# Patient Record
Sex: Female | Born: 1945 | Race: White | Hispanic: No | Smoking: Never smoker
Health system: Southern US, Community
[De-identification: ages and names within clinical notes are randomized; demographics above are authoritative.]

## PROBLEM LIST (undated history)

## (undated) DIAGNOSIS — R112 Nausea with vomiting, unspecified: Secondary | ICD-10-CM

## (undated) DIAGNOSIS — N870 Mild cervical dysplasia: Secondary | ICD-10-CM

## (undated) DIAGNOSIS — Z9889 Other specified postprocedural states: Secondary | ICD-10-CM

## (undated) DIAGNOSIS — R0683 Snoring: Secondary | ICD-10-CM

## (undated) DIAGNOSIS — N952 Postmenopausal atrophic vaginitis: Secondary | ICD-10-CM

## (undated) DIAGNOSIS — R011 Cardiac murmur, unspecified: Secondary | ICD-10-CM

## (undated) DIAGNOSIS — R5383 Other fatigue: Secondary | ICD-10-CM

## (undated) DIAGNOSIS — E7800 Pure hypercholesterolemia, unspecified: Secondary | ICD-10-CM

## (undated) DIAGNOSIS — I10 Essential (primary) hypertension: Secondary | ICD-10-CM

## (undated) DIAGNOSIS — Z87442 Personal history of urinary calculi: Secondary | ICD-10-CM

## (undated) DIAGNOSIS — G4733 Obstructive sleep apnea (adult) (pediatric): Secondary | ICD-10-CM

## (undated) DIAGNOSIS — M1990 Unspecified osteoarthritis, unspecified site: Secondary | ICD-10-CM

## (undated) DIAGNOSIS — R5381 Other malaise: Secondary | ICD-10-CM

## (undated) DIAGNOSIS — J343 Hypertrophy of nasal turbinates: Secondary | ICD-10-CM

## (undated) DIAGNOSIS — Z87898 Personal history of other specified conditions: Secondary | ICD-10-CM

## (undated) HISTORY — DX: Other specified postprocedural states: Z98.890

## (undated) HISTORY — DX: Pure hypercholesterolemia, unspecified: E78.00

## (undated) HISTORY — PX: CYSTOSCOPY: SUR368

## (undated) HISTORY — DX: Other malaise: R53.81

## (undated) HISTORY — DX: Obstructive sleep apnea (adult) (pediatric): G47.33

## (undated) HISTORY — DX: Snoring: R06.83

## (undated) HISTORY — PX: OTHER SURGICAL HISTORY: SHX169

## (undated) HISTORY — DX: Unspecified osteoarthritis, unspecified site: M19.90

## (undated) HISTORY — DX: Essential (primary) hypertension: I10

## (undated) HISTORY — DX: Mild cervical dysplasia: N87.0

## (undated) HISTORY — DX: Other specified postprocedural states: R11.2

## (undated) HISTORY — DX: Other fatigue: R53.83

## (undated) HISTORY — DX: Postmenopausal atrophic vaginitis: N95.2

---

## 1997-11-19 ENCOUNTER — Other Ambulatory Visit: Admission: RE | Admit: 1997-11-19 | Discharge: 1997-11-19 | Payer: Self-pay | Admitting: Obstetrics and Gynecology

## 1998-12-23 ENCOUNTER — Other Ambulatory Visit: Admission: RE | Admit: 1998-12-23 | Discharge: 1998-12-23 | Payer: Self-pay | Admitting: Obstetrics and Gynecology

## 1999-12-23 ENCOUNTER — Other Ambulatory Visit: Admission: RE | Admit: 1999-12-23 | Discharge: 1999-12-23 | Payer: Self-pay | Admitting: Obstetrics and Gynecology

## 2000-07-16 ENCOUNTER — Encounter: Payer: Self-pay | Admitting: Specialist

## 2000-07-23 ENCOUNTER — Ambulatory Visit (HOSPITAL_COMMUNITY): Admission: RE | Admit: 2000-07-23 | Discharge: 2000-07-23 | Payer: Self-pay | Admitting: Specialist

## 2000-12-22 ENCOUNTER — Other Ambulatory Visit: Admission: RE | Admit: 2000-12-22 | Discharge: 2000-12-22 | Payer: Self-pay | Admitting: Obstetrics and Gynecology

## 2001-12-22 ENCOUNTER — Other Ambulatory Visit: Admission: RE | Admit: 2001-12-22 | Discharge: 2001-12-22 | Payer: Self-pay | Admitting: Obstetrics and Gynecology

## 2003-01-08 ENCOUNTER — Other Ambulatory Visit: Admission: RE | Admit: 2003-01-08 | Discharge: 2003-01-08 | Payer: Self-pay | Admitting: Obstetrics and Gynecology

## 2003-06-09 HISTORY — PX: CERVICAL BIOPSY  W/ LOOP ELECTRODE EXCISION: SUR135

## 2004-01-09 ENCOUNTER — Other Ambulatory Visit: Admission: RE | Admit: 2004-01-09 | Discharge: 2004-01-09 | Payer: Self-pay | Admitting: Obstetrics and Gynecology

## 2004-03-08 DIAGNOSIS — N870 Mild cervical dysplasia: Secondary | ICD-10-CM

## 2004-03-08 HISTORY — DX: Mild cervical dysplasia: N87.0

## 2004-07-01 ENCOUNTER — Other Ambulatory Visit: Admission: RE | Admit: 2004-07-01 | Discharge: 2004-07-01 | Payer: Self-pay | Admitting: Obstetrics and Gynecology

## 2005-01-13 ENCOUNTER — Other Ambulatory Visit: Admission: RE | Admit: 2005-01-13 | Discharge: 2005-01-13 | Payer: Self-pay | Admitting: Obstetrics and Gynecology

## 2005-07-21 ENCOUNTER — Other Ambulatory Visit: Admission: RE | Admit: 2005-07-21 | Discharge: 2005-07-21 | Payer: Self-pay | Admitting: Obstetrics and Gynecology

## 2006-01-14 ENCOUNTER — Other Ambulatory Visit: Admission: RE | Admit: 2006-01-14 | Discharge: 2006-01-14 | Payer: Self-pay | Admitting: Obstetrics and Gynecology

## 2006-07-23 ENCOUNTER — Other Ambulatory Visit: Admission: RE | Admit: 2006-07-23 | Discharge: 2006-07-23 | Payer: Self-pay | Admitting: Obstetrics and Gynecology

## 2007-01-17 ENCOUNTER — Other Ambulatory Visit: Admission: RE | Admit: 2007-01-17 | Discharge: 2007-01-17 | Payer: Self-pay | Admitting: Obstetrics and Gynecology

## 2007-02-24 ENCOUNTER — Other Ambulatory Visit: Admission: RE | Admit: 2007-02-24 | Discharge: 2007-02-24 | Payer: Self-pay | Admitting: Obstetrics and Gynecology

## 2007-06-09 HISTORY — PX: REPLACEMENT TOTAL KNEE: SUR1224

## 2007-07-27 ENCOUNTER — Inpatient Hospital Stay (HOSPITAL_COMMUNITY): Admission: RE | Admit: 2007-07-27 | Discharge: 2007-07-30 | Payer: Self-pay | Admitting: Orthopedic Surgery

## 2008-01-18 ENCOUNTER — Other Ambulatory Visit: Admission: RE | Admit: 2008-01-18 | Discharge: 2008-01-18 | Payer: Self-pay | Admitting: Obstetrics and Gynecology

## 2009-01-18 ENCOUNTER — Other Ambulatory Visit: Admission: RE | Admit: 2009-01-18 | Discharge: 2009-01-18 | Payer: Self-pay | Admitting: Obstetrics and Gynecology

## 2009-01-18 ENCOUNTER — Ambulatory Visit: Payer: Self-pay | Admitting: Obstetrics and Gynecology

## 2009-01-18 ENCOUNTER — Encounter: Payer: Self-pay | Admitting: Obstetrics and Gynecology

## 2009-02-19 ENCOUNTER — Encounter: Payer: Self-pay | Admitting: Obstetrics and Gynecology

## 2009-02-19 ENCOUNTER — Other Ambulatory Visit: Admission: RE | Admit: 2009-02-19 | Discharge: 2009-02-19 | Payer: Self-pay | Admitting: Obstetrics and Gynecology

## 2009-02-19 ENCOUNTER — Ambulatory Visit: Payer: Self-pay | Admitting: Obstetrics and Gynecology

## 2009-06-20 ENCOUNTER — Ambulatory Visit: Payer: Self-pay | Admitting: Obstetrics and Gynecology

## 2009-06-20 ENCOUNTER — Other Ambulatory Visit: Admission: RE | Admit: 2009-06-20 | Discharge: 2009-06-20 | Payer: Self-pay | Admitting: Obstetrics and Gynecology

## 2010-02-12 ENCOUNTER — Ambulatory Visit: Payer: Self-pay | Admitting: Obstetrics and Gynecology

## 2010-02-12 ENCOUNTER — Other Ambulatory Visit: Admission: RE | Admit: 2010-02-12 | Discharge: 2010-02-12 | Payer: Self-pay | Admitting: Obstetrics and Gynecology

## 2010-08-13 ENCOUNTER — Other Ambulatory Visit: Payer: Self-pay | Admitting: Obstetrics and Gynecology

## 2010-08-13 ENCOUNTER — Ambulatory Visit (INDEPENDENT_AMBULATORY_CARE_PROVIDER_SITE_OTHER): Payer: Medicare Other | Admitting: Obstetrics and Gynecology

## 2010-08-13 ENCOUNTER — Other Ambulatory Visit (HOSPITAL_COMMUNITY)
Admission: RE | Admit: 2010-08-13 | Discharge: 2010-08-13 | Disposition: A | Discharge status: Home / Self Care | Payer: Medicare Other | Source: Ambulatory Visit | Attending: Obstetrics and Gynecology | Admitting: Obstetrics and Gynecology

## 2010-08-13 DIAGNOSIS — N870 Mild cervical dysplasia: Secondary | ICD-10-CM

## 2010-08-13 DIAGNOSIS — Z9189 Other specified personal risk factors, not elsewhere classified: Secondary | ICD-10-CM | POA: Insufficient documentation

## 2010-10-21 NOTE — H&P (Signed)
NAMEDONYAE, KOHN NO.:  [id number]   MEDICAL RECORD NO.:  [id number]          PATIENT TYPE:  INP   LOCATION:  1611                         FACILITY:  [facility]   PHYSICIAN:  Ollen Gross, M.D.    DATE OF BIRTH:  May 30, 1946   DATE OF ADMISSION:  07/27/2007  DATE OF DISCHARGE:                              HISTORY & PHYSICAL   DATE OF OFFICE VISIT:  History and physical was performed on July 21, 2007.   CHIEF COMPLAINT/HISTORY OF PRESENT ILLNESS:  This is a 65 year old  female who has been seen by Dr. Lequita Halt for ongoing right knee pain.  She has known end-stage arthritis.  The pain has been progressive in  nature and unresponsive to conservative measures.  It has reached a  point where she would benefit undergoing surgical intervention.  The  risks and benefits have been discussed.  She elected to proceed with  surgery.   ALLERGIES:  No known drug allergies.   CURRENT MEDICATIONS:  1. Pravastatin 80 mg daily.  2. Atacand 32/12.5 daily.  3. Metoprolol 50 mg twice a day.  4. Norvasc 5 mg daily.  5. Premarin vaginal cream 0.5 g application 3x a week.  6. Advil p.r.n.  7. Aleve p.r.n.  8. Triamcinolone for eczema.  9. Pepcid AC.   PAST MEDICAL HISTORY:  1. Hypertension.  2. Hypercholesterolemia.  3. Cardiac murmur.  4. Reflux disease.  5. History of gastric ulcers many years ago.  6. History of renal calculi.  7. Eczema.  8. Childhood illnesses to include measles and mumps.   PAST SURGICAL HISTORY:  Torn meniscus   FAMILY HISTORY:  Father deceased at 41 with heart disease.  Mother  deceased at age 28 with diabetes and heart disease.  She has one  brother, two sisters, and two sons.   SOCIAL HISTORY:  Widowed, nonsmoker, no alcohol, two children, lives  alone.   REVIEW OF SYSTEMS:  GENERAL:  No fevers, chills, or night sweats.  NEUROLOGIC:  No seizures, syncope, or paralysis.  RESPIRATORY:  No  shortness breath, cough, or hemoptysis.   CARDIOVASCULAR:  No chest pain  or orthopnea.  GI: No nausea, vomiting, diarrhea, or constipation.  GU:  A little bit of a nocturia.  No dysuria or hematuria.  MUSCULOSKELETAL:  Joint pain and stiffness.   PHYSICAL EXAMINATION:  VITAL SIGNS:  Pulse 64, respiratory rate 12,  blood pressure 181/76.  GENERAL:  A 65 year old white female, short stature, mildly anxious,  alert, oriented, cooperative, and pleasant.  Excellent historian.  HEENT:  Normocephalic, atraumatic.  Pupils are round and reactive.  EOMs  intact.  NECK:  Supple.  CHEST:  Clear anterior posterior chest walls.  No rhonchi, rales or  wheezing.  HEART:  Regular rate and rhythm.  Grade 2-3/6 systolic ejection murmur  best heard over aortic and pulmonic point.  ABDOMEN:  Soft, round.  Bowel sounds present.  RECTAL BREASTS, AND GENITALIA:  Not done, not pertinent to history of  present illness.  EXTREMITIES:  Right knee range of motion 5-120.  No effusion.  Marked  crepitus noted.   IMPRESSION:  1. Osteoarthritis right knee.  2. Hypertension.  3. Hypercholesterolemia.  4. Cardiac murmur.  5. Gastroesophageal reflux disease.  6. History of gastric ulcers.  7. Renal calculi.  8. Eczema.  9. Postmenopausal.  10.Childhood illnesses of measles and mumps.   PLAN:  The patient was admitted to [facility] to undergo right  total knee replacement arthroplasty.  Surgery will be followed by Dr.  Ollen Gross.      Alexzandrew L. Perkins, P.A.C.      Ollen Gross, M.D.  Electronically Signed    ALP/MEDQ  D:  07/27/2007  T:  07/27/2007  Job:  [id number]   cc:   Ollen Gross, M.D.  Fax: [phone]   Barry Dienes. Eloise Harman, M.D.  Fax: [phone]   Rande Brunt. Eda Paschal, M.D.  Fax: [phone]   Griffith Citron, M.D.  Fax: [phone]

## 2010-10-21 NOTE — Op Note (Signed)
Jessica Gibson, CRISANTI NO.:  [id number]   MEDICAL RECORD NO.:  [id number]          PATIENT TYPE:  INP   LOCATION:  1611                         FACILITY:  [facility]   PHYSICIAN:  Ollen Gross, M.D.    DATE OF BIRTH:  Mar 01, 1946   DATE OF PROCEDURE:  07/27/2007  DATE OF DISCHARGE:                               OPERATIVE REPORT   PREOPERATIVE DIAGNOSIS:  Osteoarthritis right knee.   POSTOPERATIVE DIAGNOSIS:  Osteoarthritis right knee.   PROCEDURE:  Right total knee arthroplasty.   SURGEON:  Ollen Gross, M.D.   ASSISTANT:  Avel Peace PA-C.   ANESTHESIA:  Spinal with Duramorph.   ESTIMATED BLOOD LOSS:  Minimal.   DRAIN:  None.   TOURNIQUET TIME:  43 minutes at 300 mmHg.   COMPLICATIONS:  None.   CONDITION:  Stable to recovery.   BRIEF CLINICAL NOTE:  Jessica Gibson  is 65 year old female who has end-stage  arthritis of the right knee with progressively worsening pain and  dysfunction.  She has failed nonoperative management including  injections and presents for total knee arthroplasty.   PROCEDURE IN DETAIL:  After the successful administration of spinal  anesthetic, a tourniquet is placed high on the right thigh and the right  lower extremity prepped and draped in the usual sterile fashion.  The  extremity is wrapped in Esmarch, knee flexed and tourniquet inflated 300  mmHg.  Midline incision is made with a 10 blade through subcutaneous  tissue to the level of the extensor mechanism.  A fresh blade is used to  make a medial parapatellar arthrotomy.  Soft tissue over the proximal  medial tibia is subperiosteally elevated to the joint line with the  knife and into the semimembranosus bursa with a Cobb elevator.  Soft  tissue laterally is elevated with attention being paid to avoid the  patellar tendon on tibial tubercle.  Patella subluxed laterally, knee  flexed 90 degrees, ACL and PCL removed.  Drill was used create a  starting hole in the distal femur and  canal was thoroughly irrigated.  The 5 degree right valgus alignment guide is placed and referencing off  the posterior condyles rotations marked ad a block pinned to remove 11  mm off the distal femur.  I took 11 because of preop flexion  contracture.  Distal femoral resection is made with an oscillating saw.  Sizing blocks placed and size 2.5 is most appropriate.  Rotations marked  off the epicondylar axis.  Size 2.5 cutting blocks placed and the  anterior, posterior and chamfer cuts made.   Tibia is subluxed forward and the menisci are removed.  Extramedullary  tibial alignment guide is placed referencing proximally at the medial  aspect of the tibial tubercle and distally along the second metatarsal  axis and tibial crest.  The block is pinned to remove 10 mm off the  nondeficient lateral side.  Tibial resection is made with an oscillating  saw.  Size 2.5 is most appropriate tibial component and the proximal  tibia prepared with the modular drill and keel punch for the size 2.5.  Femoral preparation is completed with the intercondylar cut.   Size 2.5 mobile bearing tibial trial, 2.5 posterior stabilized femoral  trial and a 10 mm posterior stabilized rotating platform insert trial  are placed.  With the 10, she has a little bit of varus-valgus anterior-  posterior play.  I went to 12.5 which allowed for full extension with  excellent varus-valgus, anterior-posterior balance throughout full range  of motion.  Patella was then everted, thickness measured to be 21 mm.  Freehand resection taken to 12 mm, 35 template is placed, lug holes are  drilled, trial patella is placed and it tracks normally.  Osteophytes  are removed off the posterior femur with the trial in place.  All trials  are removed and the cut bone surfaces are prepared with pulsatile  lavage.  Cement is mixed and once ready for implantation, the size 2.5  mobile bearing tibial tray, 2.5 posterior stabilized femur and 35   patella are cemented into place and  patella is held with the clamp.  Trial 12.5 mm insert is placed, knee held in full extension and all  extruded cement removed.  Once cement is fully hardened, then the trials  removed and the FloSeal injected in the posterior capsule.  The  permanent 12.5 mm posterior stabilized rotating platform insert is then  placed in the tibial tray.  FloSeal is then placed into medial lateral  gutters and suprapatellar area.  A moist sponge is placed and tourniquet  released with total time of 43 minutes.  Sponge is held for two minutes  and then removed.  Minimal bleeding is encountered.  The bleeding that  is encountered is stopped with cautery.  I thoroughly irrigated the  joint again and then closed the extensor mechanism with interrupted #1  PDS.  Flexion against gravity is 135 degrees.  Subcu is closed with  interrupted 2-0 Vicryl and subcuticular running 4-0 Monocryl.  The  incision is cleaned and dried and Steri-Strips and bulky sterile  dressing applied.  She is then placed into a knee immobilizer, awakened  and transported to recovery in stable condition.      Ollen Gross, M.D.  Electronically Signed     FA/MEDQ  D:  07/27/2007  T:  07/28/2007  Job:  [id number]

## 2010-10-24 NOTE — Discharge Summary (Signed)
NAMERITTA, HAMMES NO.:  [id number]   MEDICAL RECORD NO.:  [id number]          PATIENT TYPE:  INP   LOCATION:  1611                         FACILITY:  [facility]   PHYSICIAN:  Ollen Gross, M.D.    DATE OF BIRTH:  08-Apr-1946   DATE OF ADMISSION:  07/27/2007  DATE OF DISCHARGE:  07/30/2007                               DISCHARGE SUMMARY   ADMITTING DIAGNOSES:  1. Osteoarthritis, right knee.  2. Hypertension.  3. Hypercholesterolemia.  4. Cardiac murmur.  5. Gastroesophageal reflux disease.  6. Gastric ulcers.  7. Renal calculi.  8. Eczema.  9. Post menopausal.  10.Childhood illnesses:  measles and mumps.   DISCHARGE DIAGNOSES:  1. Osteoarthritis, right knee.  Status post right total knee      replacement.  2. Mild postoperative hyponatremia.  3. Hypertension.  4. Hypercholesterolemia.  5. Cardiac murmur.  6. Gastroesophageal reflux disease.  7. Gastric ulcers.  8. Renal calculi.  9. Eczema.  10.Post menopausal.  11.Childhood illnesses:  measles and mumps.   PROCEDURE:  Right total knee replacement, July 27, 2007.  Surgeon:  Ollen Gross, M.D.  Assistant:  Patrica Duel, PAC.  Spinal  anesthesia with Duramorph.  Tourniquet Time:  43 minutes.   CONSULTATIONS:  None.   BRIEF HISTORY:  Jessica Gibson is a 65 year old female with osteoarthritis of the  right knee and progressive worsening pain.  This has failed conservative  management, including injections.  She now presents for a total knee.   LABORATORY DATA:  Preoperative CBC showed:  Hemoglobin 13.6, hematocrit  38.8, white cell count 4.7, platelets 336.  Serial CBCs were monitored;  hemoglobin dropped down to 11.2.  The last known H&H was 10.3 and 28.8.  PT and PTT preoperatively were 12.7 and 31 respectively.  INR 0.9.  Serial Pro times were followed, with PT and PTT __________ and INR 1.7.  Chem Panel initially all within normal limits.  Serial __________ were  followed.  Sodium did drop from  140 to 133, and back to 139.  Preoperative __________ negative.   EKG:  July 19, 2007 -- Sinus bradycardia, with possible left atrial  enlargement (not confirmed).   CHEST X-RAY:  July 19, 2007 -- No active cardiopulmonary disease.   HOSPITAL COURSE:  The patient was admitted to [facility] and  tolerated her procedure well.  Started PCA and __________.  The pain was  under decent control and weaned off her p.o. medications.  She was  started back on her home medications.  She had a decent output.  Her  sodium was down a little bit, so we decreased the fluids.   By day #2 she was up walking about 65 feet; doing much better.  Excellent output.  Sodium had already improved.  Hemoglobin was stable  at 11.  We discontinued the PCA, and able to move to p.o. medications.  The incision looked good.  She continued to progress well with physical  therapy and ready to go home by the following day of July 30, 2007.   DISCHARGE PLAN:  1. The patient was  discharged home on July 30, 2007.  2. Discharge diagnoses:  Please see the above.  3. Discharge Medications:  Percocet, Robaxin, Coumadin.  4. Diet:  Resume home diet.  5. Activity:  Weightbearing as tolerated.  __________.      Alexzandrew L. Perkins, P.A.C.      Ollen Gross, M.D.  Electronically Signed    ALP/MEDQ  D:  09/12/2007  T:  09/12/2007  Job:  [id number]   cc:   Barry Dienes. Eloise Harman, M.D.  Fax: [phone]   Rande Brunt. Eda Paschal, M.D.  Fax: [phone]   Griffith Citron, M.D.  Fax: [phone]

## 2010-10-24 NOTE — Op Note (Signed)
[facility]  Patient:    Jessica Gibson, Jessica Gibson                          MRN: [id number] Proc. Date: 07/23/00 Attending:  Javier Docker, M.D.                           Operative Report  PREOPERATIVE DIAGNOSES: 1. Degenerative joint disease, right knee. 2. Medial meniscus tear, right knee.  POSTOPERATIVE DIAGNOSES: 1. Degenerative joint disease, right knee. 2. Medial meniscus tear, right knee. 3. 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 4. 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  PROCEDURES PERFORMED: 1. Right knee arthroscopy. 2. Chondroplasty of the patella, femoral sulcus, medial femoral condyle,    medial tibial plateau. 3. Partial medial meniscectomy. 4. Abrasion arthroplasty of the tibial plateau.  BRIEF HISTORY:  The patient is a 65 year old with refractory knee pain and swelling and "giving away".  MRI indicating meniscal tear.  Operative intervention is indicated for diagnosis and treatment.  Risks and benefits were discussed including: bleeding, infection, damage to neurovascular structures, no change of symptoms, need for repeat debridement, total knee arthroplasty in the future, etc.  TECHNIQUE:  With the patient in the supine position and after induction of adequate general anesthesia and 1 g of Kefzol, the right lower extremity was prepped and draped in the usual sterile fashion.  Standard superomedial and anterolateral parapatellar portals were fashioned with a #11 blade.  Ingress cannula was atraumatically placed.  Irrigant was utilized and insufflated into joint.  Arthroscopic camera was then inserted, and under direct visualization medial parapatellar portal was fashioned with a #11 blade after localization with an 18-gauge needle, sparing the medial meniscus.  Immediately seen was a posteromedial meniscal tear that was extensive and complex; was unstable.  There was a small area of grade 4  chondromalacia of the fibula plateau on the leading edge of the medial meniscus tear.  There were grade 3 changes throughout the tibial plateau, femoral sockets and of the patella.  ACL and PCL were unremarkable.  Lateral compartment normal.  Lateral meniscus normal.  Femoral condyle and tibial plateau and meniscus stable to probe palpation without evidence of tear.  Straight basket rongeur was introduced and guide was performed.  I resected the medial meniscus to a stable base and it was further contoured with a 4:2 Cuda shaver.  Next, chondroplasty was performed with a full radius resector of the tibial plateau, femoral condyle and femoral sulcus into the patella.  A small area of grade 4, as previously mentioned, and ablator was introduced and utilized to perform abrasion arthroplasty of this section; it only measured approximately 1 cm x 1 cm square.  The abrasion arthroplasty was to good bleeding bone.  Next, the knee was copiously irrigated.  Patellofemoral tracking was observed and found to be normal.  The gutters were unremarkable.  The knee was copiously irrigated; all other compartments were reexamined.  There was no evidence of loose cartilaginous debris or meniscal lesions that were unstable. It was extensively probed and found to be stable.  The remnant of the meniscus was stable.  Again, the knee was copiously irrigated.  All compartments are again reinspected; no evidence of residual pathology amenable to further arthroscopic intervention.  Once the hemostasis was obtained, the portals were closed with 4-0 nylon simple sutures.  Marcaine 0.25% with epinephrine was infiltrated into the  joint.  It was dressed sterilely.  The patient was awakened without difficulty and transported to the recovery room in satisfactory condition. DD:  07/23/00 TD:  07/23/00 Job: [id number]

## 2011-02-11 DIAGNOSIS — M1990 Unspecified osteoarthritis, unspecified site: Secondary | ICD-10-CM | POA: Insufficient documentation

## 2011-02-11 DIAGNOSIS — N952 Postmenopausal atrophic vaginitis: Secondary | ICD-10-CM | POA: Insufficient documentation

## 2011-02-11 DIAGNOSIS — I10 Essential (primary) hypertension: Secondary | ICD-10-CM | POA: Insufficient documentation

## 2011-02-11 DIAGNOSIS — E7800 Pure hypercholesterolemia, unspecified: Secondary | ICD-10-CM | POA: Insufficient documentation

## 2011-02-18 ENCOUNTER — Other Ambulatory Visit (HOSPITAL_COMMUNITY)
Admission: RE | Admit: 2011-02-18 | Discharge: 2011-02-18 | Disposition: A | Discharge status: Home / Self Care | Payer: Medicare Other | Source: Ambulatory Visit | Attending: Obstetrics and Gynecology | Admitting: Obstetrics and Gynecology

## 2011-02-18 ENCOUNTER — Ambulatory Visit (INDEPENDENT_AMBULATORY_CARE_PROVIDER_SITE_OTHER): Payer: Medicare Other | Admitting: Obstetrics and Gynecology

## 2011-02-18 ENCOUNTER — Encounter: Payer: Self-pay | Admitting: Obstetrics and Gynecology

## 2011-02-18 DIAGNOSIS — N952 Postmenopausal atrophic vaginitis: Secondary | ICD-10-CM

## 2011-02-18 DIAGNOSIS — Z124 Encounter for screening for malignant neoplasm of cervix: Secondary | ICD-10-CM | POA: Insufficient documentation

## 2011-02-18 DIAGNOSIS — N870 Mild cervical dysplasia: Secondary | ICD-10-CM

## 2011-02-18 NOTE — Progress Notes (Signed)
Patient came back to see me today for further followup of CIN-1. Her last Pap smear normal. She is up-to-date on mammograms and bone densities. She does her lab through PCP. We are only treating her for atrophic vaginitis with Premarin vaginal cream.  External within normal limits. BUS within normal limits. Vagina within normal limits. Cervix is clean. Uterus is normal size and shape. Adnexa failed to reveal masses.  Assessment: CIN-1. Atrophic vaginitis  Plan: Pap okay return in one year. Continue Premarin vaginal cream.

## 2011-02-27 LAB — PROTIME-INR
INR: 0.9
Prothrombin Time: 12.7
Prothrombin Time: 14.6
Prothrombin Time: 20.3 — ABNORMAL HIGH

## 2011-02-27 LAB — BASIC METABOLIC PANEL
BUN: 6
BUN: 9
CO2: 28
CO2: 31
Chloride: 100
Creatinine, Ser: 0.8
GFR calc non Af Amer: >60
Glucose, Bld: 121 — ABNORMAL HIGH
Potassium: 3.7
Potassium: 3.7
Sodium: 135

## 2011-02-27 LAB — COMPREHENSIVE METABOLIC PANEL
ALT: 13
BUN: 18
Calcium: 9.8
Creatinine, Ser: 0.79
GFR calc non Af Amer: >60
Glucose, Bld: 95
Sodium: 140
Total Protein: 7.1

## 2011-02-27 LAB — CBC
HCT: 28.8 — ABNORMAL LOW
HCT: 31 — ABNORMAL LOW
HCT: 31.5 — ABNORMAL LOW
Hemoglobin: 13.6
Hemoglobin: 11 — ABNORMAL LOW
MCHC: 35.2
MCHC: 35.4
MCHC: 35.7
MCHC: 35.7
MCV: 86.1
MCV: 86.4
MCV: 86.6
Platelets: 224
Platelets: 224
Platelets: 241
RBC: 3.64 — ABNORMAL LOW
RDW: 13.2
RDW: 12.5
RDW: 12.7
WBC: 7.1

## 2011-02-27 LAB — TYPE AND SCREEN: ABO/RH(D): O POS

## 2011-02-27 LAB — URINALYSIS, ROUTINE W REFLEX MICROSCOPIC
Ketones, ur: NEGATIVE
Nitrite: NEGATIVE
Protein, ur: NEGATIVE
Urobilinogen, UA: 0.2

## 2011-02-27 LAB — APTT: aPTT: 31

## 2011-06-16 DIAGNOSIS — L708 Other acne: Secondary | ICD-10-CM | POA: Diagnosis not present

## 2011-07-15 DIAGNOSIS — Z1231 Encounter for screening mammogram for malignant neoplasm of breast: Secondary | ICD-10-CM | POA: Diagnosis not present

## 2011-07-16 ENCOUNTER — Encounter: Payer: Self-pay | Admitting: Obstetrics and Gynecology

## 2011-08-20 DIAGNOSIS — D239 Other benign neoplasm of skin, unspecified: Secondary | ICD-10-CM | POA: Diagnosis not present

## 2011-08-20 DIAGNOSIS — L723 Sebaceous cyst: Secondary | ICD-10-CM | POA: Diagnosis not present

## 2011-08-20 DIAGNOSIS — L821 Other seborrheic keratosis: Secondary | ICD-10-CM | POA: Diagnosis not present

## 2011-08-20 DIAGNOSIS — L909 Atrophic disorder of skin, unspecified: Secondary | ICD-10-CM | POA: Diagnosis not present

## 2011-12-17 DIAGNOSIS — H40019 Open angle with borderline findings, low risk, unspecified eye: Secondary | ICD-10-CM | POA: Diagnosis not present

## 2011-12-17 DIAGNOSIS — H01009 Unspecified blepharitis unspecified eye, unspecified eyelid: Secondary | ICD-10-CM | POA: Diagnosis not present

## 2012-01-06 DIAGNOSIS — H40019 Open angle with borderline findings, low risk, unspecified eye: Secondary | ICD-10-CM | POA: Diagnosis not present

## 2012-01-06 DIAGNOSIS — H01009 Unspecified blepharitis unspecified eye, unspecified eyelid: Secondary | ICD-10-CM | POA: Diagnosis not present

## 2012-01-21 DIAGNOSIS — I10 Essential (primary) hypertension: Secondary | ICD-10-CM | POA: Diagnosis not present

## 2012-01-21 DIAGNOSIS — E785 Hyperlipidemia, unspecified: Secondary | ICD-10-CM | POA: Diagnosis not present

## 2012-01-28 DIAGNOSIS — Z23 Encounter for immunization: Secondary | ICD-10-CM | POA: Diagnosis not present

## 2012-01-28 DIAGNOSIS — Z1212 Encounter for screening for malignant neoplasm of rectum: Secondary | ICD-10-CM | POA: Diagnosis not present

## 2012-01-28 DIAGNOSIS — E785 Hyperlipidemia, unspecified: Secondary | ICD-10-CM | POA: Diagnosis not present

## 2012-01-28 DIAGNOSIS — M129 Arthropathy, unspecified: Secondary | ICD-10-CM | POA: Diagnosis not present

## 2012-01-28 DIAGNOSIS — Z0000 Encounter for general adult medical examination without abnormal findings: Secondary | ICD-10-CM | POA: Diagnosis not present

## 2012-01-28 DIAGNOSIS — I10 Essential (primary) hypertension: Secondary | ICD-10-CM | POA: Diagnosis not present

## 2012-02-18 DIAGNOSIS — L259 Unspecified contact dermatitis, unspecified cause: Secondary | ICD-10-CM | POA: Diagnosis not present

## 2012-02-22 ENCOUNTER — Encounter: Payer: Self-pay | Admitting: Obstetrics and Gynecology

## 2012-02-22 ENCOUNTER — Ambulatory Visit (INDEPENDENT_AMBULATORY_CARE_PROVIDER_SITE_OTHER): Payer: Medicare Other | Admitting: Obstetrics and Gynecology

## 2012-02-22 VITALS — BP 140/74 | Ht 62.5 in | Wt 157.0 lb

## 2012-02-22 DIAGNOSIS — Z780 Asymptomatic menopausal state: Secondary | ICD-10-CM

## 2012-02-22 DIAGNOSIS — N952 Postmenopausal atrophic vaginitis: Secondary | ICD-10-CM | POA: Diagnosis not present

## 2012-02-22 DIAGNOSIS — N870 Mild cervical dysplasia: Secondary | ICD-10-CM

## 2012-02-22 NOTE — Progress Notes (Signed)
Patient came to see me today for further followup. 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 She has had normal Pap smears since then. Her last Pap smear was 2012. She does have atrophic vaginitis and for a while was on estrogen cream but is no longer sexually active as she is a widow and is out of the cream and does not have symptoms. She is having hot flashes but they are tolerable. She is up-to-date on bone densities N/A are normal. She does them through her PCP. She had a mammogram this year which was normal. She is having no vaginal bleeding. She is having no pelvic pain.  ROS: 12 system review done. Pertinent positives above. Other positives include hypertension and hyperlipidemia. Patient also has osteoarthritis.  Physical examination: Leonard Schwartz present. HEENT within normal limits. Neck: Thyroid not large. No masses. Supraclavicular nodes: not enlarged. Breasts: Examined in both sitting and lying  position. No skin changes and no masses. Abdomen: Soft no guarding rebound or masses or hernia. Pelvic: External: Within normal limits. BUS: Within normal limits. Vaginal:within normal limits. Fair  estrogen effect. No evidence of cystocele rectocele or enterocele. Cervix: clean. Uterus: Normal size and shape. Adnexa: No masses. Rectovaginal exam: Confirmatory and negative. Extremities: Within normal limits.  Assessment: #1. CIN-1 #2. Menopausal symptoms #3. Atrophic vaginitis  Plan: Continue yearly mammograms. Observation of menopausal symptoms.The new Pap smear guidelines were discussed with the patient. No pap done.

## 2012-02-22 NOTE — Patient Instructions (Signed)
Continue yearly mammograms 

## 2012-07-23 ENCOUNTER — Other Ambulatory Visit: Payer: Self-pay

## 2012-07-28 DIAGNOSIS — Z1231 Encounter for screening mammogram for malignant neoplasm of breast: Secondary | ICD-10-CM | POA: Diagnosis not present

## 2012-08-25 DIAGNOSIS — L821 Other seborrheic keratosis: Secondary | ICD-10-CM | POA: Diagnosis not present

## 2012-08-25 DIAGNOSIS — D1801 Hemangioma of skin and subcutaneous tissue: Secondary | ICD-10-CM | POA: Diagnosis not present

## 2012-08-25 DIAGNOSIS — L819 Disorder of pigmentation, unspecified: Secondary | ICD-10-CM | POA: Diagnosis not present

## 2012-08-25 DIAGNOSIS — D239 Other benign neoplasm of skin, unspecified: Secondary | ICD-10-CM | POA: Diagnosis not present

## 2013-01-09 DIAGNOSIS — H40019 Open angle with borderline findings, low risk, unspecified eye: Secondary | ICD-10-CM | POA: Diagnosis not present

## 2013-01-09 DIAGNOSIS — H2510 Age-related nuclear cataract, unspecified eye: Secondary | ICD-10-CM | POA: Diagnosis not present

## 2013-01-11 ENCOUNTER — Other Ambulatory Visit: Payer: Self-pay

## 2013-01-23 DIAGNOSIS — E785 Hyperlipidemia, unspecified: Secondary | ICD-10-CM | POA: Diagnosis not present

## 2013-01-23 DIAGNOSIS — I10 Essential (primary) hypertension: Secondary | ICD-10-CM | POA: Diagnosis not present

## 2013-01-23 DIAGNOSIS — Z79899 Other long term (current) drug therapy: Secondary | ICD-10-CM | POA: Diagnosis not present

## 2013-01-30 ENCOUNTER — Encounter: Payer: Self-pay | Admitting: Gastroenterology

## 2013-01-30 DIAGNOSIS — Z1212 Encounter for screening for malignant neoplasm of rectum: Secondary | ICD-10-CM | POA: Diagnosis not present

## 2013-01-30 DIAGNOSIS — M545 Low back pain: Secondary | ICD-10-CM | POA: Diagnosis not present

## 2013-01-30 DIAGNOSIS — I10 Essential (primary) hypertension: Secondary | ICD-10-CM | POA: Diagnosis not present

## 2013-01-30 DIAGNOSIS — Z23 Encounter for immunization: Secondary | ICD-10-CM | POA: Diagnosis not present

## 2013-01-30 DIAGNOSIS — K219 Gastro-esophageal reflux disease without esophagitis: Secondary | ICD-10-CM | POA: Diagnosis not present

## 2013-01-30 DIAGNOSIS — E785 Hyperlipidemia, unspecified: Secondary | ICD-10-CM | POA: Diagnosis not present

## 2013-01-30 DIAGNOSIS — Z0000 Encounter for general adult medical examination without abnormal findings: Secondary | ICD-10-CM | POA: Diagnosis not present

## 2013-01-30 DIAGNOSIS — Z79899 Other long term (current) drug therapy: Secondary | ICD-10-CM | POA: Diagnosis not present

## 2013-01-30 DIAGNOSIS — M129 Arthropathy, unspecified: Secondary | ICD-10-CM | POA: Diagnosis not present

## 2013-01-30 DIAGNOSIS — Z1331 Encounter for screening for depression: Secondary | ICD-10-CM | POA: Diagnosis not present

## 2013-02-22 ENCOUNTER — Encounter: Payer: Self-pay | Admitting: Gynecology

## 2013-02-22 ENCOUNTER — Ambulatory Visit (INDEPENDENT_AMBULATORY_CARE_PROVIDER_SITE_OTHER): Payer: Medicare Other | Admitting: Gynecology

## 2013-02-22 VITALS — BP 124/74 | Ht 63.0 in | Wt 161.0 lb

## 2013-02-22 DIAGNOSIS — Z780 Asymptomatic menopausal state: Secondary | ICD-10-CM | POA: Diagnosis not present

## 2013-02-22 DIAGNOSIS — N952 Postmenopausal atrophic vaginitis: Secondary | ICD-10-CM

## 2013-02-22 DIAGNOSIS — N898 Other specified noninflammatory disorders of vagina: Secondary | ICD-10-CM | POA: Diagnosis not present

## 2013-02-22 NOTE — Progress Notes (Signed)
Jessica Gibson [birth year] [id number]        67 y.o.  G2P2 for followup exam.  Former patient of Dr. Eda Paschal.  Past medical history,surgical history, medications, allergies, family history and social history were all reviewed and documented in the EPIC chart.  ROS:  Performed and pertinent positives and negatives are included in the history, assessment and plan .  Exam: Kim assistant Filed Vitals:   02/22/13 1050  BP: 124/74  Height: 5\' 3"  (1.6 m)  Weight: 161 lb (73.029 kg)   General appearance  Normal Skin grossly normal Head/Neck normal with no cervical or supraclavicular adenopathy thyroid normal Lungs  clear Cardiac RR, without RMG Abdominal  soft, nontender, without masses, organomegaly or hernia Breasts  examined lying and sitting without masses, retractions, discharge or axillary adenopathy. Pelvic  Ext/BUS/vagina  With atrophic genital changes. Small submucosal midline nodule 2-3 cm within the introital opening. Nontender freely mobile consistent with a submucosal cyst.  Cervix  normal with atrophic changes  Uterus  anteverted, normal size, shape and contour, midline and mobile nontender   Adnexa  Without masses or tenderness    Anus and perineum  normal   Rectovaginal  normal sphincter tone without palpated masses or tenderness.    Assessment/Plan:  67 y.o. G2P2 female for followup exam.   1. Postmenopausal. Patient is having some atrophic genital changes with vaginal dryness. Not sexually active. Still having some hot flushes. Discussed OTC soy-based products as well as vaginal moisturizers. Options for HRT/vaginal estrogen support/Osphena all reviewed. Patient not interested but prefers to try OTC products. Followup if wants to discuss other treatment options. 2. Vaginal cyst. Patient has small posterior midline vaginal submucosal nodule felt to be small cyst. Nontender freely mobile. Never noticed previously. Recommended followup in 3-6 months for reexamination for  stability as never noticed before. 3. Pap smear 2012. History of LEEP for persistent CIN-1 2005. Pap smears normal afterwards. Plan repeat Pap smear next year at 3 year interval. Options to stop screening over the age of 67 was discussed and we'll readdress after next years Pap smear. 4. Mammography reported done 07/2012 although we do not have a copy of this. Patient's going to check to make sure it was done and it was normal. She'll continue with annual mammography. SBE monthly reviewed. 5. Colonoscopy scheduled this October. 6. DEXA several years ago reported normal at other physician's office. She'll repeat at their recommended interval. Increased calcium vitamin D reviewed. 7. Health maintenance. No blood work done as it is all done through her other physician's office. Followup one year, sooner as needed.  Note: This document was prepared with digital dictation and possible smart phrase technology. Any transcriptional errors that result from this process are unintentional.   Dara Lords MD, 11:13 AM 02/22/2013

## 2013-02-22 NOTE — Patient Instructions (Signed)
Followup in 3-6 months for reexamination to followup on the vaginal cyst.

## 2013-02-23 LAB — URINALYSIS W MICROSCOPIC + REFLEX CULTURE
Bacteria, UA: NONE SEEN
Bilirubin Urine: NEGATIVE
Glucose, UA: NEGATIVE mg/dL
Hgb urine dipstick: NEGATIVE
Ketones, ur: NEGATIVE mg/dL
Protein, ur: NEGATIVE mg/dL
Squamous Epithelial / LPF: NONE SEEN
Urobilinogen, UA: 1 mg/dL (ref 0.0–1.0)

## 2013-03-01 ENCOUNTER — Ambulatory Visit (AMBULATORY_SURGERY_CENTER): Payer: Self-pay | Admitting: *Deleted

## 2013-03-01 ENCOUNTER — Telehealth: Payer: Self-pay | Admitting: *Deleted

## 2013-03-01 VITALS — Ht 62.5 in | Wt 165.0 lb

## 2013-03-01 DIAGNOSIS — Z1211 Encounter for screening for malignant neoplasm of colon: Secondary | ICD-10-CM

## 2013-03-01 MED ORDER — MOVIPREP 100 G PO SOLR: ORAL | Status: DC

## 2013-03-01 NOTE — Telephone Encounter (Signed)
Patient here in Pre-visit for screening colonoscopy. Patient seen Dr.Patterson in 1999 for sigmoid. Patient had colonoscopy 07-19-2002 with Dr.Medoff, diverticulosis seen. 10 year recall per patient. Patient signed release of information form and it was given to [facility].

## 2013-03-01 NOTE — Progress Notes (Signed)
Patient denies any allergies to eggs or soy. Patient denies any problems with anesthesia.  

## 2013-03-01 NOTE — Progress Notes (Signed)
Patient states she had last colonoscopy 07-19-2002 with Dr.Medoff. ?Diverticulosis seen, 10 years follow up per patient. Release of information form signed and given to [facility].

## 2013-03-15 ENCOUNTER — Ambulatory Visit (AMBULATORY_SURGERY_CENTER): Payer: Medicare Other | Admitting: Gastroenterology

## 2013-03-15 ENCOUNTER — Encounter: Payer: Self-pay | Admitting: Gastroenterology

## 2013-03-15 VITALS — BP 148/71 | HR 57 | Temp 97.0°F | Resp 12 | Ht 62.5 in | Wt 165.0 lb

## 2013-03-15 DIAGNOSIS — I10 Essential (primary) hypertension: Secondary | ICD-10-CM | POA: Diagnosis not present

## 2013-03-15 DIAGNOSIS — E669 Obesity, unspecified: Secondary | ICD-10-CM | POA: Diagnosis not present

## 2013-03-15 DIAGNOSIS — M129 Arthropathy, unspecified: Secondary | ICD-10-CM | POA: Diagnosis not present

## 2013-03-15 DIAGNOSIS — D126 Benign neoplasm of colon, unspecified: Secondary | ICD-10-CM

## 2013-03-15 DIAGNOSIS — E785 Hyperlipidemia, unspecified: Secondary | ICD-10-CM | POA: Diagnosis not present

## 2013-03-15 DIAGNOSIS — Z1211 Encounter for screening for malignant neoplasm of colon: Secondary | ICD-10-CM

## 2013-03-15 MED ORDER — SODIUM CHLORIDE 0.9 % IV SOLN: 500.0000 mL | INTRAVENOUS | Status: DC

## 2013-03-15 NOTE — Progress Notes (Signed)
Patient did not experience any of the following events: a burn prior to discharge; a fall within the facility; wrong site/side/patient/procedure/implant event; or a hospital transfer or hospital admission upon discharge from the facility. (G8907) Patient did not have preoperative order for IV antibiotic SSI prophylaxis. (G8918)  

## 2013-03-15 NOTE — Patient Instructions (Signed)
Diverticulosis seen today, and colon polyp removed. Await pathology results. Handouts given on diverticulosis,polyps,high fiber diet, & fiber supplements. Resume current medications. Call us with any questions or concerns. thank you!!  YOU HAD AN ENDOSCOPIC PROCEDURE TODAY AT THE [facility]: Refer to the procedure report that was given to you for any specific questions about what was found during the examination.  If the procedure report does not answer your questions, please call your gastroenterologist to clarify.  If you requested that your care partner not be given the details of your procedure findings, then the procedure report has been included in a sealed envelope for you to review at your convenience later.  YOU SHOULD EXPECT: Some feelings of bloating in the abdomen. Passage of more gas than usual.  Walking can help get rid of the air that was put into your GI tract during the procedure and reduce the bloating. If you had a lower endoscopy (such as a colonoscopy or flexible sigmoidoscopy) you may notice spotting of blood in your stool or on the toilet paper. If you underwent a bowel prep for your procedure, then you may not have a normal bowel movement for a few days.  DIET: Your first meal following the procedure should be a light meal and then it is ok to progress to your normal diet.  A half-sandwich or bowl of soup is an example of a good first meal.  Heavy or fried foods are harder to digest and may make you feel nauseous or bloated.  Likewise meals heavy in dairy and vegetables can cause extra gas to form and this can also increase the bloating.  Drink plenty of fluids but you should avoid alcoholic beverages for 24 hours.  ACTIVITY: Your care partner should take you home directly after the procedure.  You should plan to take it easy, moving slowly for the rest of the day.  You can resume normal activity the day after the procedure however you should NOT DRIVE or use heavy  machinery for 24 hours (because of the sedation medicines used during the test).    SYMPTOMS TO REPORT IMMEDIATELY: A gastroenterologist can be reached at any hour.  During normal business hours, 8:30 AM to 5:00 PM Monday through Friday, call [phone].  After hours and on weekends, please call the GI answering service at [phone] who will take a message and have the physician on call contact you.   Following lower endoscopy (colonoscopy or flexible sigmoidoscopy):  Excessive amounts of blood in the stool  Significant tenderness or worsening of abdominal pains  Swelling of the abdomen that is new, acute  Fever of 100F or higher  Following upper endoscopy (EGD)  Vomiting of blood or coffee ground material  New chest pain or pain under the shoulder blades  Painful or persistently difficult swallowing  New shortness of breath  Fever of 100F or higher  Black, tarry-looking stools  FOLLOW UP: If any biopsies were taken you will be contacted by phone or by letter within the next 1-3 weeks.  Call your gastroenterologist if you have not heard about the biopsies in 3 weeks.  Our staff will call the home number listed on your records the next business day following your procedure to check on you and address any questions or concerns that you may have at that time regarding the information given to you following your procedure. This is a courtesy call and so if there is no answer at the home number and  we have not heard from you through the emergency physician on call, we will assume that you have returned to your regular daily activities without incident.  SIGNATURES/CONFIDENTIALITY: You and/or your care partner have signed paperwork which will be entered into your electronic medical record.  These signatures attest to the fact that that the information above on your After Visit Summary has been reviewed and is understood.  Full responsibility of the confidentiality of this discharge  information lies with you and/or your care-partner.

## 2013-03-15 NOTE — Progress Notes (Signed)
Called to room to assist during endoscopic procedure.  Patient ID and intended procedure confirmed with present staff. Received instructions for my participation in the procedure from the performing physician. ewm 

## 2013-03-15 NOTE — Progress Notes (Signed)
Stable to RR 

## 2013-03-15 NOTE — Op Note (Signed)
[facility] [address] [place], [address]   COLONOSCOPY PROCEDURE REPORT  PATIENT: Mylei, Brackeen  MR#: [id number] BIRTHDATE: [birth year] , 67  yrs. old GENDER: Female ENDOSCOPIST: Mardella Layman, MD, [facility] REFERRED BY: PROCEDURE DATE:  03/15/2013 PROCEDURE:   Colonoscopy with biopsy First Screening Colonoscopy - Avg.  risk and is 50 yrs.  old or older - No.      History of Adenoma - Now for follow-up colonoscopy & has been > or = to 3 yrs.  N/A  Polyps Removed Today? Yes. ASA CLASS:   Class II INDICATIONS:average risk screening. MEDICATIONS: propofol (Diprivan) 200mg  IV  DESCRIPTION OF PROCEDURE:   After the risks benefits and alternatives of the procedure were thoroughly explained, informed consent was obtained.  A digital rectal exam revealed no abnormalities of the rectum.   The LB [id number]  endoscope was introduced through the anus and advanced to the cecum, which was identified by both the appendix and ileocecal valve. No adverse events experienced.   The quality of the prep was excellent, using MoviPrep  The instrument was then slowly withdrawn as the colon was fully examined.      COLON FINDINGS: Moderate diverticulosis was noted throughout the entire examined colon.   A small smooth flat polyp was found at the hepatic flexure.  Multiple biopsies were performed using cold forceps.  Retroflexed views revealed no abnormalities. The time to cecum=2 minutes 57 seconds.  Withdrawal time=7 minutes 46 seconds. The scope was withdrawn and the procedure completed. COMPLICATIONS: There were no complications.  ENDOSCOPIC IMPRESSION: 1.   Moderate diverticulosis was noted throughout the entire examined colon 2.   Small flat polyp was found at the hepatic flexure; multiple biopsies were performed using cold forceps...r/o adenoma  RECOMMENDATIONS: 1.  Await pathology results 2.  Repeat colonoscopy in 5 years if polyp adenomatous; otherwise  10 years   eSigned:  Mardella Layman, MD, [facility] 03/15/2013 9:41 AM   cc: Jarome Matin, MD   PATIENT NAME:  Ladawna, Walgren MR#: [id number]

## 2013-03-16 ENCOUNTER — Telehealth: Payer: Self-pay | Admitting: *Deleted

## 2013-03-16 NOTE — Telephone Encounter (Signed)
  Follow up Call-  Call back number 03/15/2013  Post procedure Call Back phone  # [phone]  Permission to leave phone message Yes     Patient questions:  Do you have a fever, pain , or abdominal swelling? no Pain Score  0 *  Have you tolerated food without any problems? yes  Have you been able to return to your normal activities? yes  Do you have any questions about your discharge instructions: Diet   no Medications  no Follow up visit  no  Do you have questions or concerns about your Care? no  Actions: * If pain score is 4 or above: No action needed, pain <4.

## 2013-03-21 ENCOUNTER — Encounter: Payer: Self-pay | Admitting: Gastroenterology

## 2013-04-13 ENCOUNTER — Other Ambulatory Visit: Payer: Self-pay

## 2013-07-05 ENCOUNTER — Ambulatory Visit (INDEPENDENT_AMBULATORY_CARE_PROVIDER_SITE_OTHER): Payer: Medicare Other | Admitting: Gynecology

## 2013-07-05 ENCOUNTER — Encounter: Payer: Self-pay | Admitting: Gynecology

## 2013-07-05 DIAGNOSIS — N898 Other specified noninflammatory disorders of vagina: Secondary | ICD-10-CM

## 2013-07-05 NOTE — Patient Instructions (Addendum)
Followup in September 2015 for your annual exam.

## 2013-07-05 NOTE — Progress Notes (Signed)
Patient follows up for reexamination.  Had small posterior vaginal mucosal cyst midline 2 fingerbreadths from the introitus noted at annual exam September 2014 and was asked to followup to make sure this remains stable. She notices no symptoms.  Exam with Jessica Gibson External BUS vagina with atrophic changes. Small submucosal cyst midline posterior vaginal mucosa 2 fingerbreadths from the introitus. Cervix normal. Uterus grossly normal size midline mobile nontender. Adnexa without masses or tenderness.  Assessment and plan: Small submucosal posterior vaginal wall cyst stable. Followup September 2019 when due for annual exam. Of note my last exam said cyst was 2-3 cm. I think this was a misprint in the actually appears to be 3-4 mm in size today. Regardless it is small and benign in appearance and will continue to observe this with followup in September.

## 2013-07-31 DIAGNOSIS — Z1231 Encounter for screening mammogram for malignant neoplasm of breast: Secondary | ICD-10-CM | POA: Diagnosis not present

## 2013-08-02 ENCOUNTER — Encounter: Payer: Self-pay | Admitting: Gynecology

## 2013-09-07 ENCOUNTER — Other Ambulatory Visit: Payer: Self-pay | Admitting: Dermatology

## 2013-09-07 DIAGNOSIS — D239 Other benign neoplasm of skin, unspecified: Secondary | ICD-10-CM | POA: Diagnosis not present

## 2013-09-07 DIAGNOSIS — D1801 Hemangioma of skin and subcutaneous tissue: Secondary | ICD-10-CM | POA: Diagnosis not present

## 2013-09-07 DIAGNOSIS — L819 Disorder of pigmentation, unspecified: Secondary | ICD-10-CM | POA: Diagnosis not present

## 2013-09-07 DIAGNOSIS — D485 Neoplasm of uncertain behavior of skin: Secondary | ICD-10-CM | POA: Diagnosis not present

## 2013-09-07 DIAGNOSIS — C44519 Basal cell carcinoma of skin of other part of trunk: Secondary | ICD-10-CM | POA: Diagnosis not present

## 2013-09-07 DIAGNOSIS — L821 Other seborrheic keratosis: Secondary | ICD-10-CM | POA: Diagnosis not present

## 2013-09-07 DIAGNOSIS — L570 Actinic keratosis: Secondary | ICD-10-CM | POA: Diagnosis not present

## 2013-09-07 DIAGNOSIS — Z85828 Personal history of other malignant neoplasm of skin: Secondary | ICD-10-CM | POA: Diagnosis not present

## 2013-11-02 DIAGNOSIS — L910 Hypertrophic scar: Secondary | ICD-10-CM | POA: Diagnosis not present

## 2013-11-02 DIAGNOSIS — Z85828 Personal history of other malignant neoplasm of skin: Secondary | ICD-10-CM | POA: Diagnosis not present

## 2014-01-11 DIAGNOSIS — H40019 Open angle with borderline findings, low risk, unspecified eye: Secondary | ICD-10-CM | POA: Diagnosis not present

## 2014-01-31 DIAGNOSIS — I10 Essential (primary) hypertension: Secondary | ICD-10-CM | POA: Diagnosis not present

## 2014-01-31 DIAGNOSIS — E785 Hyperlipidemia, unspecified: Secondary | ICD-10-CM | POA: Diagnosis not present

## 2014-02-07 DIAGNOSIS — Z0000 Encounter for general adult medical examination without abnormal findings: Secondary | ICD-10-CM | POA: Diagnosis not present

## 2014-02-07 DIAGNOSIS — Z1331 Encounter for screening for depression: Secondary | ICD-10-CM | POA: Diagnosis not present

## 2014-02-07 DIAGNOSIS — M545 Low back pain, unspecified: Secondary | ICD-10-CM | POA: Diagnosis not present

## 2014-02-07 DIAGNOSIS — R209 Unspecified disturbances of skin sensation: Secondary | ICD-10-CM | POA: Diagnosis not present

## 2014-02-07 DIAGNOSIS — E785 Hyperlipidemia, unspecified: Secondary | ICD-10-CM | POA: Diagnosis not present

## 2014-02-07 DIAGNOSIS — Z79899 Other long term (current) drug therapy: Secondary | ICD-10-CM | POA: Diagnosis not present

## 2014-02-07 DIAGNOSIS — I10 Essential (primary) hypertension: Secondary | ICD-10-CM | POA: Diagnosis not present

## 2014-02-07 DIAGNOSIS — G4700 Insomnia, unspecified: Secondary | ICD-10-CM | POA: Diagnosis not present

## 2014-02-19 DIAGNOSIS — IMO0002 Reserved for concepts with insufficient information to code with codable children: Secondary | ICD-10-CM | POA: Diagnosis not present

## 2014-02-19 DIAGNOSIS — M19049 Primary osteoarthritis, unspecified hand: Secondary | ICD-10-CM | POA: Diagnosis not present

## 2014-02-19 DIAGNOSIS — M47812 Spondylosis without myelopathy or radiculopathy, cervical region: Secondary | ICD-10-CM | POA: Diagnosis not present

## 2014-02-26 ENCOUNTER — Ambulatory Visit (INDEPENDENT_AMBULATORY_CARE_PROVIDER_SITE_OTHER): Payer: Medicare Other | Admitting: Gynecology

## 2014-02-26 ENCOUNTER — Encounter: Payer: Self-pay | Admitting: Gynecology

## 2014-02-26 ENCOUNTER — Other Ambulatory Visit (HOSPITAL_COMMUNITY)
Admission: RE | Admit: 2014-02-26 | Discharge: 2014-02-26 | Disposition: A | Discharge status: Home / Self Care | Payer: Medicare Other | Source: Ambulatory Visit | Attending: Gynecology | Admitting: Gynecology

## 2014-02-26 VITALS — BP 124/80 | Ht 62.5 in | Wt 166.0 lb

## 2014-02-26 DIAGNOSIS — N898 Other specified noninflammatory disorders of vagina: Secondary | ICD-10-CM | POA: Diagnosis not present

## 2014-02-26 DIAGNOSIS — N952 Postmenopausal atrophic vaginitis: Secondary | ICD-10-CM | POA: Diagnosis not present

## 2014-02-26 DIAGNOSIS — Z124 Encounter for screening for malignant neoplasm of cervix: Secondary | ICD-10-CM | POA: Diagnosis not present

## 2014-02-26 NOTE — Addendum Note (Signed)
Addended by: Nelva Nay on: 02/26/2014 11:47 AM   Modules accepted: Orders

## 2014-02-26 NOTE — Progress Notes (Signed)
Jessica Gibson Feb 15, 1946 [id number]        68 y.o.  G2P2 for follow up exam. Several issues noted below.  Past medical history,surgical history, problem list, medications, allergies, family history and social history were all reviewed and documented as reviewed in the EPIC chart.  ROS:  12 system ROS performed with pertinent positives and negatives included in the history, assessment and plan.   Additional significant findings :  Right wrist pain actively being evaluated by orthopedics   Exam: Kim assistant Filed Vitals:   02/26/14 0956  BP: 124/80  Height: 5' 2.5" (1.588 m)  Weight: 166 lb (75.297 kg)   General appearance:  Normal affect, orientation and appearance. Skin: Grossly normal HEENT: Without gross lesions.  No cervical or supraclavicular adenopathy. Thyroid normal.  Lungs:  Clear without wheezing, rales or rhonchi Cardiac: RR, without RMG Abdominal:  Soft, nontender, without masses, guarding, rebound, organomegaly or hernia Breasts:  Examined lying and sitting without masses, retractions, discharge or axillary adenopathy. Pelvic:  Ext/BUS/vagina with generalized atrophic changes. Small 4 mm midline some mucosal vaginal cyst 2 fingerbreadths from the introitus. Unchanged from prior exam.  Cervix with atrophic changes. Pap done  Uterus anteverted, normal size, shape and contour, midline and mobile nontender   Adnexa  Without masses or tenderness    Anus and perineum  Normal   Rectovaginal  Normal sphincter tone without palpated masses or tenderness.    Assessment/Plan:  68 y.o. G2P2 female for follow up exam.   1. Postmenopausal/atrophic genital changes. Patient without significant symptoms of hot flashes, vaginal dryness, dyspareunia or any vaginal bleeding. Continue to monitor. Report any vaginal bleeding. 2. Small submucosal vaginal cyst. Unchanged on serial exams. Patient without symptoms. Continue with observation and follow up in one year. 3. Pap smear 2012. Pap done  today. 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 Normal Pap smears since then. 4. Mammography 07/2013. Continued annual mammography. SBE monthly reviewed. 5. Colonoscopy 2014. Repeat at a bare recommended interval. 6. DEXA 2013 reported normal. Patient follows with her primary physician for this. Increased calcium and vitamin D reviewed. 7. Health maintenance. No routine blood work done as she has this done through her primary physician's office. Follow up one year, sooner as needed.     Anastasio Auerbach MD, 10:23 AM 02/26/2014

## 2014-02-26 NOTE — Patient Instructions (Signed)
You may obtain a copy of any labs that were done today by logging onto MyChart as outlined in the instructions provided with your AVS (after visit summary). The office will not call with normal lab results but certainly if there are any significant abnormalities then we will contact you.   Health Maintenance, Female A healthy lifestyle and preventative care can promote health and wellness.  Maintain regular health, dental, and eye exams.  Eat a healthy diet. Foods like vegetables, fruits, whole grains, low-fat dairy products, and lean protein foods contain the nutrients you need without too many calories. Decrease your intake of foods high in solid fats, added sugars, and salt. Get information about a proper diet from your caregiver, if necessary.  Regular physical exercise is one of the most important things you can do for your health. Most adults should get at least 150 minutes of moderate-intensity exercise (any activity that increases your heart rate and causes you to sweat) each week. In addition, most adults need muscle-strengthening exercises on 2 or more days a week.   Maintain a healthy weight. The body mass index (BMI) is a screening tool to identify possible weight problems. It provides an estimate of body fat based on height and weight. Your caregiver can help determine your BMI, and can help you achieve or maintain a healthy weight. For adults 20 years and older:  A BMI below 18.5 is considered underweight.  A BMI of 18.5 to 24.9 is normal.  A BMI of 25 to 29.9 is considered overweight.  A BMI of 30 and above is considered obese.  Maintain normal blood lipids and cholesterol by exercising and minimizing your intake of saturated fat. Eat a balanced diet with plenty of fruits and vegetables. Blood tests for lipids and cholesterol should begin at age 61 and be repeated every 5 years. If your lipid or cholesterol levels are high, you are over 50, or you are a high risk for heart  disease, you may need your cholesterol levels checked more frequently.Ongoing high lipid and cholesterol levels should be treated with medicines if diet and exercise are not effective.  If you smoke, find out from your caregiver how to quit. If you do not use tobacco, do not start.  Lung cancer screening is recommended for adults aged 33 80 years who are at high risk for developing lung cancer because of a history of smoking. Yearly low-dose computed tomography (CT) is recommended for people who have at least a 30-pack-year history of smoking and are a current smoker or have quit within the past 15 years. A pack year of smoking is smoking an average of 1 pack of cigarettes a day for 1 year (for example: 1 pack a day for 30 years or 2 packs a day for 15 years). Yearly screening should continue until the smoker has stopped smoking for at least 15 years. Yearly screening should also be stopped for people who develop a health problem that would prevent them from having lung cancer treatment.  If you are pregnant, do not drink alcohol. If you are breastfeeding, be very cautious about drinking alcohol. If you are not pregnant and choose to drink alcohol, do not exceed 1 drink per day. One drink is considered to be 12 ounces (355 mL) of beer, 5 ounces (148 mL) of wine, or 1.5 ounces (44 mL) of liquor.  Avoid use of street drugs. Do not share needles with anyone. Ask for help if you need support or instructions about stopping  the use of drugs.  High blood pressure causes heart disease and increases the risk of stroke. Blood pressure should be checked at least every 1 to 2 years. Ongoing high blood pressure should be treated with medicines, if weight loss and exercise are not effective.  If you are 59 to 68 years old, ask your caregiver if you should take aspirin to prevent strokes.  Diabetes screening involves taking a blood sample to check your fasting blood sugar level. This should be done once every 3  years, after age 91, if you are within normal weight and without risk factors for diabetes. Testing should be considered at a younger age or be carried out more frequently if you are overweight and have at least 1 risk factor for diabetes.  Breast cancer screening is essential preventative care for women. You should practice "breast self-awareness." This means understanding the normal appearance and feel of your breasts and may include breast self-examination. Any changes detected, no matter how small, should be reported to a caregiver. Women in their 66s and 30s should have a clinical breast exam (CBE) by a caregiver as part of a regular health exam every 1 to 3 years. After age 101, women should have a CBE every year. Starting at age 100, women should consider having a mammogram (breast X-ray) every year. Women who have a family history of breast cancer should talk to their caregiver about genetic screening. Women at a high risk of breast cancer should talk to their caregiver about having an MRI and a mammogram every year.  Breast cancer gene (BRCA)-related cancer risk assessment is recommended for women who have family members with BRCA-related cancers. BRCA-related cancers include breast, ovarian, tubal, and peritoneal cancers. Having family members with these cancers may be associated with an increased risk for harmful changes (mutations) in the breast cancer genes BRCA1 and BRCA2. Results of the assessment will determine the need for genetic counseling and BRCA1 and BRCA2 testing.  The Pap test is a screening test for cervical cancer. Women should have a Pap test starting at age 57. Between ages 25 and 35, Pap tests should be repeated every 2 years. Beginning at age 37, you should have a Pap test every 3 years as long as the past 3 Pap tests have been normal. If you had a hysterectomy for a problem that was not cancer or a condition that could lead to cancer, then you no longer need Pap tests. If you are  between ages 50 and 76, and you have had normal Pap tests going back 10 years, you no longer need Pap tests. If you have had past treatment for cervical cancer or a condition that could lead to cancer, you need Pap tests and screening for cancer for at least 20 years after your treatment. If Pap tests have been discontinued, risk factors (such as a new sexual partner) need to be reassessed to determine if screening should be resumed. Some women have medical problems that increase the chance of getting cervical cancer. In these cases, your caregiver may recommend more frequent screening and Pap tests.  The human papillomavirus (HPV) test is an additional test that may be used for cervical cancer screening. The HPV test looks for the virus that can cause the cell changes on the cervix. The cells collected during the Pap test can be tested for HPV. The HPV test could be used to screen women aged 44 years and older, and should be used in women of any age  who have unclear Pap test results. After the age of 55, women should have HPV testing at the same frequency as a Pap test.  Colorectal cancer can be detected and often prevented. Most routine colorectal cancer screening begins at the age of 44 and continues through age 20. However, your caregiver may recommend screening at an earlier age if you have risk factors for colon cancer. On a yearly basis, your caregiver may provide home test kits to check for hidden blood in the stool. Use of a small camera at the end of a tube, to directly examine the colon (sigmoidoscopy or colonoscopy), can detect the earliest forms of colorectal cancer. Talk to your caregiver about this at age 86, when routine screening begins. Direct examination of the colon should be repeated every 5 to 10 years through age 13, unless early forms of pre-cancerous polyps or small growths are found.  Hepatitis C blood testing is recommended for all people born from [birth year] through 1965 and any  individual with known risks for hepatitis C.  Practice safe sex. Use condoms and avoid high-risk sexual practices to reduce the spread of sexually transmitted infections (STIs). Sexually active women aged 36 and younger should be checked for Chlamydia, which is a common sexually transmitted infection. Older women with new or multiple partners should also be tested for Chlamydia. Testing for other STIs is recommended if you are sexually active and at increased risk.  Osteoporosis is a disease in which the bones lose minerals and strength with aging. This can result in serious bone fractures. The risk of osteoporosis can be identified using a bone density scan. Women ages 20 and over and women at risk for fractures or osteoporosis should discuss screening with their caregivers. Ask your caregiver whether you should be taking a calcium supplement or vitamin D to reduce the rate of osteoporosis.  Menopause can be associated with physical symptoms and risks. Hormone replacement therapy is available to decrease symptoms and risks. You should talk to your caregiver about whether hormone replacement therapy is right for you.  Use sunscreen. Apply sunscreen liberally and repeatedly throughout the day. You should seek shade when your shadow is shorter than you. Protect yourself by wearing long sleeves, pants, a wide-brimmed hat, and sunglasses year round, whenever you are outdoors.  Notify your caregiver of new moles or changes in moles, especially if there is a change in shape or color. Also notify your caregiver if a mole is larger than the size of a pencil eraser.  Stay current with your immunizations. Document Released: 12/08/2010 Document Revised: 09/19/2012 Document Reviewed: 12/08/2010 [facility] Patient Information 2014 [employer].

## 2014-02-27 LAB — URINALYSIS W MICROSCOPIC + REFLEX CULTURE
Bacteria, UA: NONE SEEN
Bilirubin Urine: NEGATIVE
CASTS: NONE SEEN
Crystals: NONE SEEN
GLUCOSE, UA: NEGATIVE mg/dL
HGB URINE DIPSTICK: NEGATIVE
Ketones, ur: NEGATIVE mg/dL
Leukocytes, UA: NEGATIVE
Nitrite: NEGATIVE
Protein, ur: NEGATIVE mg/dL
SPECIFIC GRAVITY, URINE: 1.015 (ref 1.005–1.030)
Squamous Epithelial / LPF: NONE SEEN
Urobilinogen, UA: 0.2 mg/dL (ref 0.0–1.0)
pH: 6.5 (ref 5.0–8.0)

## 2014-02-28 ENCOUNTER — Ambulatory Visit (INDEPENDENT_AMBULATORY_CARE_PROVIDER_SITE_OTHER): Payer: Medicare Other | Admitting: Neurology

## 2014-02-28 ENCOUNTER — Encounter: Payer: Self-pay | Admitting: Neurology

## 2014-02-28 VITALS — BP 154/81 | HR 54 | Resp 16 | Ht 62.25 in | Wt 167.0 lb

## 2014-02-28 DIAGNOSIS — G5600 Carpal tunnel syndrome, unspecified upper limb: Secondary | ICD-10-CM | POA: Diagnosis not present

## 2014-02-28 DIAGNOSIS — R292 Abnormal reflex: Secondary | ICD-10-CM | POA: Diagnosis not present

## 2014-02-28 DIAGNOSIS — R0609 Other forms of dyspnea: Secondary | ICD-10-CM

## 2014-02-28 DIAGNOSIS — R5383 Other fatigue: Secondary | ICD-10-CM

## 2014-02-28 DIAGNOSIS — R0989 Other specified symptoms and signs involving the circulatory and respiratory systems: Secondary | ICD-10-CM

## 2014-02-28 DIAGNOSIS — G4700 Insomnia, unspecified: Secondary | ICD-10-CM | POA: Diagnosis not present

## 2014-02-28 DIAGNOSIS — R5381 Other malaise: Secondary | ICD-10-CM

## 2014-02-28 DIAGNOSIS — G4733 Obstructive sleep apnea (adult) (pediatric): Secondary | ICD-10-CM | POA: Insufficient documentation

## 2014-02-28 DIAGNOSIS — G4730 Sleep apnea, unspecified: Secondary | ICD-10-CM | POA: Diagnosis not present

## 2014-02-28 DIAGNOSIS — R0683 Snoring: Secondary | ICD-10-CM

## 2014-02-28 DIAGNOSIS — G5601 Carpal tunnel syndrome, right upper limb: Secondary | ICD-10-CM

## 2014-02-28 HISTORY — DX: Snoring: R06.83

## 2014-02-28 HISTORY — DX: Other malaise: R53.81

## 2014-02-28 LAB — CYTOLOGY - PAP

## 2014-02-28 NOTE — Progress Notes (Signed)
SLEEP MEDICINE CLINIC   Provider:  Larey Seat, M Gibson  Referring Provider: Leanna Battles, Gibson Primary Care Physician:  Jessica Gibson  Chief Complaint  Patient presents with  . New Evaluation    Room 10  . Sleep consult    HPI:  Jessica Gibson is a 68 y.o. female  Is seen here as a referral from Jessica Gibson for a sleep evaluation. Jessica Gibson reports that she has shoulder pain arthritis especially at the sacral iliac joints at the knees and ankles shoulders. Is also some neck stiffness. She brought to Jessica Gibson attention that when she wakes out of sleep she has numb hands and numb:  Which concerned her. The patient has a history of a heart murmur of gastroesophageal reflux disease or acid reflux and kidney stones with hematuria. 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   The patient is widowed and has only recently heard form a friend that she snores, but not "bad" , no apnea. If asleep sitting in a recliner she chokes sometimes for breath and wakes up.   11 PM is her regular bed time, TV on sleeper, but running  While she reads in bed. Feels relaxed by midnight. Bedroom is cool, quiet but not dark.  2-3 nocturias. She wakes from the" numbness', beginning in May 2015 , first it was only the right hand, then the left hand and now the toes.  She feels stiff and achy and in the morning " feels like 90" . Wakes up at 7 AM spontaneously , by day light.  It takes her 30 minutes to get moving , takes breakfast daily at home. Caffeine intake : 3 cups in AM, none in the PM.   Overall sleep time is 5-6 hours. No regular naps. Short naps refresh the most.    No vivid dreams, rarely dreaming. 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 Re-adjusted over the last 5 years slowly.   Back to the numbness:  It is not the feeling of asleep limbs, its a  stiffness and loss of sensation.  She has a gloved feeling , radiating to the forearms but not to the elbow.  She begun dropping things. Lost the feeling for th objects, for example a pencil. Handwriting has changed.  Pain at the wrist , shooting through the thumb. The use of the Computer mouse gives her problems.  Rare ETOH, non smoker.  Lives alone with her dog.   I have met Jessica Gibson late husband in 2005 , who has seen Jessica Gibson and Jessica Gibson and Jessica Gibson for DBS. Patient has an appointment with Jessica Gibson, orthopedic for cervical spine .      Review of Systems: Out of a complete 14 system review, the patient complains of only the following symptoms, and all other reviewed systems are negative. Epworth score 5  , Fatigue severity score 26  ,  Geriatric depression score 1 points  No falls in 6 month.     History   Social History  . Marital Status: Widowed    Spouse Name: N/A    Number of Children: 2  . Years of Education: College   Occupational History  .     Social History Main Topics  . Smoking status: Never Smoker   . Smokeless tobacco: Never Used  .  Alcohol Use: Yes     Comment: seldom  . Drug Use: No  . Sexual Activity: No   Other Topics Concern  . Not on file   Social History Narrative   Patient is widowed and lives alone.   Patient has two adult children.   Patient is self-employed, semi-retired.   Patient is right-handed.   Patient drinks three cups of coffee daily.   Patient has some college education.    Family History  Problem Relation Age of Onset  . Diabetes Mother   . Hypertension Mother   . Heart disease Mother   . Heart disease Father   . Colon cancer Neg Hx   . Other Father     circulation problems    Past Medical History  Diagnosis Date  . CIN I (cervical intraepithelial neoplasia I) 03/2004    LEEP  . Atrophic vaginitis   . Arthritis     KNEE  . Hypertension   . Hypercholesteremia   . Post-operative nausea and vomiting      Past Surgical History  Procedure Laterality Date  . 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 Arthroscopy of knee    . Cervical biopsy  w/ loop electrode excision  2005    Current Outpatient Prescriptions  Medication Sig Dispense Refill  . amLODipine (NORVASC) 5 MG tablet Take 5 mg by mouth daily.        Marland Kitchen amoxicillin (AMOXIL) 500 MG capsule Take 2,000 mg by mouth as directed. TAKES ONE HOUR BEFORE DENTAL APPOINTMENT,       . ergocalciferol (VITAMIN D2) 50000 UNITS capsule Take 50,000 Units by mouth every 30 (thirty) days.      Marland Kitchen losartan-hydrochlorothiazide (HYZAAR) 100-12.5 MG per tablet Take 1 tablet by mouth daily.        . meloxicam (MOBIC) 15 MG tablet Take 15 mg by mouth daily.      . metoprolol (LOPRESSOR) 50 MG tablet Take 50 mg by mouth 2 (two) times daily.        . minoxidil (LONITEN) 2.5 MG tablet Take 2.5 mg by mouth daily.      . pravastatin (PRAVACHOL) 40 MG tablet Take 40 mg by mouth daily.         No current facility-administered medications for this visit.    Allergies as of 02/28/2014  . (No Known Allergies)    Vitals: BP 154/81  Pulse 54  Resp 16  Ht 5' 2.25" (1.581 m)  Wt 167 lb (75.751 kg)  BMI 30.31 kg/m2 Last Weight:  Wt Readings from Last 1 Encounters:  02/28/14 167 lb (75.751 kg)       Last Height:   Ht Readings from Last 1 Encounters:  02/28/14 5' 2.25" (1.581 m)    Physical exam:  General: The patient is awake, alert and appears not in acute distress. The patient is well groomed. Head: Normocephalic, atraumatic. Neck is supple. Mallampati 3 ,  neck circumference: 15 .5. Nasal airflow unrestricted  TMJ is  Not evident . Retrognathia is not seen.  Cardiovascular:  Regular rate and rhythm , without  murmurs or carotid bruit, and without distended neck veins. Respiratory: Lungs are clear to auscultation. Skin:  Without evidence of edema, or rash Trunk: BMI is elevated and patient  has normal posture.  Neurologic exam : The patient is  awake and alert, oriented to place and time.   Memory subjective described as intact.  There is a normal attention span & concentration ability.  Speech is fluent without  dysarthria, dysphonia or aphasia. Mood and affect are appropriate.  Cranial nerves: Pupils are equal and briskly reactive to light. Funduscopic exam without  evidence of pallor or edema.  Extraocular movements  in vertical and horizontal planes intact , she has a deviation of the left eye with horizontal gaze ot the right - her lazy eye. -without nystagmus.  Visual fields by finger perimetry are intact. Hearing to finger rub intact.  Facial sensation intact to fine touch. Facial motor strength is symmetric and tongue and uvula move midline.  Motor exam:  Normal tone ,muscle bulk and symmetric strength in all extremities. Crepitation over right knee and left shoulder . ROM limitation.   Sensory:  Fine touch, pinprick and vibration were tested in all extremities.  Proprioception is  normal.  Coordination: Rapid alternating movements in the fingers/hands is normal.  Finger-to-nose maneuver  normal without evidence of ataxia, dysmetria or tremor.  Gait and station: Patient walks without assistive device and is able unassisted to climb up to the exam table.  Strength within normal limits. Stance is stable and normal. Tandem gait is unfragmented. Romberg testing is  negative.  Deep tendon reflexes: in the  upper and lower extremities are symmetric, the patella reflex is very brisk- spinal stenosis?  . Babinski maneuver response isdowngoing.   Assessment:  After physical and neurologic examination, review of laboratory studies, imaging, neurophysiology testing and pre-existing records, assessment is   insommia with snoring, but mostly nocturia and sensatory causes her fragmented sleep    The patient was advised of the nature of the diagnosed sleep disorder , the treatment options and risks for general a health and wellness  arising from not treating the condition. Visit duration was 45 minutes.   Plan:  Treatment plan and additional workup :   SPLIT study, CO2 only if available( no priority ) ,  Neuropathy panel. EMG and NCV on the hands. Carpal tunnel.                   Asencion Partridge Pyper Olexa Gibson  02/28/2014

## 2014-02-28 NOTE — Patient Instructions (Signed)
Polysomnography (Sleep Studies) Polysomnography (PSG) is a series of tests used for detecting (diagnosing) obstructive sleep apnea and other sleep disorders. The tests measure how some parts of your body are working while you are sleeping. The tests are extensive and expensive. They are done in a sleep lab or hospital, and vary from center to center. Your caregiver may perform other more simple sleep studies and questionnaires before doing more complete and involved testing. Testing may not be covered by insurance. Some of these tests are:  An EEG (Electroencephalogram). This tests your brain waves and stages of sleep.  An EOG (Electrooculogram). This measures the movements of your eyes. It detects periods of REM (rapid eye movement) sleep, which is your dream sleep.  An EKG (Electrocardiogram). This measures your heart rhythm.  EMG (Electromyography). This is a measurement of how the muscles are working in your upper airway and your legs while sleeping.  An oximetry measurement. It measures how much oxygen (air) you are getting while sleeping.  Breathing efforts may be measured. The same test can be interpreted (understood) differently by different caregivers and centers that study sleep.  Studies may be given an apnea/hypopnea index (AHI). This is a number which is found by counting the times of no breathing or under breathing during the night, and relating those numbers to the amount of time spent in bed. When the AHI is greater than 15, the patient is likely to complain of daytime sleepiness. When the AHI is greater than 30, the patient is at increased risk for heart problems and must be followed more closely. Following the AHI also allows you to know how treatment is working. Simple oximetry (tracking the amount of oxygen that is taken in) can be used for screening patients who:  Do not have symptoms (problems) of OSA.  Have a normal Epworth Sleepiness Scale Score.  Have a low pre-test  probability of having OSA.  Have none of the upper airway problems likely to cause apnea.  Oximetry is also used to determine if treatment is effective in patients who showed significant desaturations (not getting enough oxygen) on their home sleep study. One extra measure of safety is to perform additional studies for the person who only snores. This is because no one can predict with absolute certainty who will have OSA. Those who show significant desaturations (not getting enough oxygen) are recommended to have a more detailed sleep study. Document Released: 11/29/2002 Document Revised: 08/17/2011 Document Reviewed: 07/31/2013 Exit[employer] Patient Information 2015 [employer]. This information is not intended to replace advice given to you by your health care provider. Make sure you discuss any questions you have with your health care provider.  

## 2014-03-07 DIAGNOSIS — M47812 Spondylosis without myelopathy or radiculopathy, cervical region: Secondary | ICD-10-CM | POA: Diagnosis not present

## 2014-03-08 ENCOUNTER — Encounter: Payer: Medicare Other | Admitting: Neurology

## 2014-03-19 DIAGNOSIS — M5012 Cervical disc disorder with radiculopathy, mid-cervical region: Secondary | ICD-10-CM | POA: Diagnosis not present

## 2014-03-19 DIAGNOSIS — G5602 Carpal tunnel syndrome, left upper limb: Secondary | ICD-10-CM | POA: Diagnosis not present

## 2014-03-19 DIAGNOSIS — M47812 Spondylosis without myelopathy or radiculopathy, cervical region: Secondary | ICD-10-CM | POA: Diagnosis not present

## 2014-03-19 DIAGNOSIS — M1812 Unilateral primary osteoarthritis of first carpometacarpal joint, left hand: Secondary | ICD-10-CM | POA: Diagnosis not present

## 2014-03-19 DIAGNOSIS — M24232 Disorder of ligament, left wrist: Secondary | ICD-10-CM | POA: Diagnosis not present

## 2014-03-19 DIAGNOSIS — M1832 Unilateral post-traumatic osteoarthritis of first carpometacarpal joint, left hand: Secondary | ICD-10-CM | POA: Diagnosis not present

## 2014-03-19 DIAGNOSIS — M1811 Unilateral primary osteoarthritis of first carpometacarpal joint, right hand: Secondary | ICD-10-CM | POA: Diagnosis not present

## 2014-03-19 DIAGNOSIS — G5601 Carpal tunnel syndrome, right upper limb: Secondary | ICD-10-CM | POA: Diagnosis not present

## 2014-04-03 DIAGNOSIS — M810 Age-related osteoporosis without current pathological fracture: Secondary | ICD-10-CM | POA: Diagnosis not present

## 2014-04-05 ENCOUNTER — Ambulatory Visit (INDEPENDENT_AMBULATORY_CARE_PROVIDER_SITE_OTHER): Payer: Medicare Other | Admitting: Neurology

## 2014-04-05 DIAGNOSIS — G5601 Carpal tunnel syndrome, right upper limb: Secondary | ICD-10-CM

## 2014-04-05 DIAGNOSIS — G4700 Insomnia, unspecified: Secondary | ICD-10-CM

## 2014-04-05 DIAGNOSIS — R292 Abnormal reflex: Secondary | ICD-10-CM

## 2014-04-05 DIAGNOSIS — G4733 Obstructive sleep apnea (adult) (pediatric): Secondary | ICD-10-CM | POA: Diagnosis not present

## 2014-04-05 DIAGNOSIS — G4730 Sleep apnea, unspecified: Secondary | ICD-10-CM

## 2014-04-06 NOTE — Sleep Study (Signed)
Please see the scanned sleep study interpretation located in the Procedure tab within the Chart Review section. 

## 2014-04-09 ENCOUNTER — Encounter: Payer: Self-pay | Admitting: Neurology

## 2014-04-12 DIAGNOSIS — G4730 Sleep apnea, unspecified: Secondary | ICD-10-CM

## 2014-04-12 DIAGNOSIS — G4700 Insomnia, unspecified: Secondary | ICD-10-CM | POA: Insufficient documentation

## 2014-04-16 ENCOUNTER — Telehealth: Payer: Self-pay | Admitting: *Deleted

## 2014-04-16 NOTE — Telephone Encounter (Signed)
Patient was contacted and provided the results of her overnight sleep study that revealed mild sleep apnea.  Patient was informed that a CPAP titration study had been recommended but she was unwilling to schedule at this time.  Patient wanted to review results and decide.  Patient was mailed a copy of the results and Dr. Leanna Battles was faxed a copy of the report.

## 2014-05-07 DIAGNOSIS — M1811 Unilateral primary osteoarthritis of first carpometacarpal joint, right hand: Secondary | ICD-10-CM | POA: Diagnosis not present

## 2014-05-07 DIAGNOSIS — M1832 Unilateral post-traumatic osteoarthritis of first carpometacarpal joint, left hand: Secondary | ICD-10-CM | POA: Diagnosis not present

## 2014-05-07 DIAGNOSIS — M47812 Spondylosis without myelopathy or radiculopathy, cervical region: Secondary | ICD-10-CM | POA: Diagnosis not present

## 2014-05-07 DIAGNOSIS — M1812 Unilateral primary osteoarthritis of first carpometacarpal joint, left hand: Secondary | ICD-10-CM | POA: Diagnosis not present

## 2014-05-16 DIAGNOSIS — I10 Essential (primary) hypertension: Secondary | ICD-10-CM | POA: Diagnosis not present

## 2014-05-16 DIAGNOSIS — G4733 Obstructive sleep apnea (adult) (pediatric): Secondary | ICD-10-CM | POA: Diagnosis not present

## 2014-05-16 DIAGNOSIS — Z6830 Body mass index (BMI) 30.0-30.9, adult: Secondary | ICD-10-CM | POA: Diagnosis not present

## 2014-08-28 DIAGNOSIS — Z1231 Encounter for screening mammogram for malignant neoplasm of breast: Secondary | ICD-10-CM | POA: Diagnosis not present

## 2014-08-29 ENCOUNTER — Encounter: Payer: Self-pay | Admitting: Gynecology

## 2014-11-02 DIAGNOSIS — D485 Neoplasm of uncertain behavior of skin: Secondary | ICD-10-CM | POA: Diagnosis not present

## 2014-11-02 DIAGNOSIS — Z85828 Personal history of other malignant neoplasm of skin: Secondary | ICD-10-CM | POA: Diagnosis not present

## 2014-11-02 DIAGNOSIS — L859 Epidermal thickening, unspecified: Secondary | ICD-10-CM | POA: Diagnosis not present

## 2014-11-02 DIAGNOSIS — D225 Melanocytic nevi of trunk: Secondary | ICD-10-CM | POA: Diagnosis not present

## 2014-11-02 DIAGNOSIS — L814 Other melanin hyperpigmentation: Secondary | ICD-10-CM | POA: Diagnosis not present

## 2014-11-02 DIAGNOSIS — D1801 Hemangioma of skin and subcutaneous tissue: Secondary | ICD-10-CM | POA: Diagnosis not present

## 2014-11-02 DIAGNOSIS — D2271 Melanocytic nevi of right lower limb, including hip: Secondary | ICD-10-CM | POA: Diagnosis not present

## 2014-11-02 DIAGNOSIS — L570 Actinic keratosis: Secondary | ICD-10-CM | POA: Diagnosis not present

## 2014-11-02 DIAGNOSIS — L820 Inflamed seborrheic keratosis: Secondary | ICD-10-CM | POA: Diagnosis not present

## 2014-11-02 DIAGNOSIS — D224 Melanocytic nevi of scalp and neck: Secondary | ICD-10-CM | POA: Diagnosis not present

## 2014-11-02 DIAGNOSIS — L821 Other seborrheic keratosis: Secondary | ICD-10-CM | POA: Diagnosis not present

## 2014-12-05 ENCOUNTER — Observation Stay (HOSPITAL_COMMUNITY)
Admission: EM | Admit: 2014-12-05 | Discharge: 2014-12-06 | Disposition: A | Discharge status: Home / Self Care | Payer: Medicare Other | Attending: Internal Medicine | Admitting: Internal Medicine

## 2014-12-05 ENCOUNTER — Emergency Department (HOSPITAL_COMMUNITY): Payer: Medicare Other

## 2014-12-05 ENCOUNTER — Encounter (HOSPITAL_COMMUNITY): Payer: Self-pay | Admitting: Emergency Medicine

## 2014-12-05 DIAGNOSIS — M13869 Other specified arthritis, unspecified knee: Secondary | ICD-10-CM | POA: Insufficient documentation

## 2014-12-05 DIAGNOSIS — R011 Cardiac murmur, unspecified: Secondary | ICD-10-CM | POA: Diagnosis not present

## 2014-12-05 DIAGNOSIS — E780 Pure hypercholesterolemia: Secondary | ICD-10-CM | POA: Diagnosis not present

## 2014-12-05 DIAGNOSIS — R42 Dizziness and giddiness: Secondary | ICD-10-CM | POA: Diagnosis not present

## 2014-12-05 DIAGNOSIS — I739 Peripheral vascular disease, unspecified: Secondary | ICD-10-CM | POA: Insufficient documentation

## 2014-12-05 DIAGNOSIS — G319 Degenerative disease of nervous system, unspecified: Secondary | ICD-10-CM | POA: Diagnosis not present

## 2014-12-05 DIAGNOSIS — H8110 Benign paroxysmal vertigo, unspecified ear: Secondary | ICD-10-CM | POA: Diagnosis not present

## 2014-12-05 DIAGNOSIS — Z96651 Presence of right artificial knee joint: Secondary | ICD-10-CM | POA: Insufficient documentation

## 2014-12-05 DIAGNOSIS — I10 Essential (primary) hypertension: Secondary | ICD-10-CM | POA: Diagnosis not present

## 2014-12-05 DIAGNOSIS — R112 Nausea with vomiting, unspecified: Secondary | ICD-10-CM | POA: Diagnosis not present

## 2014-12-05 DIAGNOSIS — R61 Generalized hyperhidrosis: Secondary | ICD-10-CM | POA: Diagnosis not present

## 2014-12-05 DIAGNOSIS — R404 Transient alteration of awareness: Secondary | ICD-10-CM | POA: Diagnosis not present

## 2014-12-05 HISTORY — DX: Cardiac murmur, unspecified: R01.1

## 2014-12-05 LAB — BASIC METABOLIC PANEL
Anion gap: 10 (ref 5–15)
BUN: 20 mg/dL (ref 6–20)
CHLORIDE: 106 mmol/L (ref 101–111)
CO2: 23 mmol/L (ref 22–32)
Calcium: 9 mg/dL (ref 8.9–10.3)
Creatinine, Ser: 0.95 mg/dL (ref 0.44–1.00)
GFR calc Af Amer: >60 mL/min (ref >60)
GFR calc non Af Amer: 60 mL/min — ABNORMAL LOW (ref >60)
Glucose, Bld: 119 mg/dL — ABNORMAL HIGH (ref 65–99)
POTASSIUM: 3.6 mmol/L (ref 3.5–5.1)
SODIUM: 139 mmol/L (ref 135–145)

## 2014-12-05 LAB — URINALYSIS, ROUTINE W REFLEX MICROSCOPIC
BILIRUBIN URINE: NEGATIVE
GLUCOSE, UA: NEGATIVE mg/dL
HGB URINE DIPSTICK: NEGATIVE
KETONES UR: NEGATIVE mg/dL
Leukocytes, UA: NEGATIVE
NITRITE: NEGATIVE
Protein, ur: NEGATIVE mg/dL
Specific Gravity, Urine: 1.011 (ref 1.005–1.030)
UROBILINOGEN UA: 0.2 mg/dL (ref 0.0–1.0)
pH: 7.5 (ref 5.0–8.0)

## 2014-12-05 LAB — CBC WITH DIFFERENTIAL/PLATELET
Basophils Absolute: 0 10*3/uL (ref 0.0–0.1)
Basophils Relative: 0 % (ref 0–1)
Eosinophils Absolute: 0.1 10*3/uL (ref 0.0–0.7)
Eosinophils Relative: 2 % (ref 0–5)
HEMATOCRIT: 36 % (ref 36.0–46.0)
Hemoglobin: 12 g/dL (ref 12.0–15.0)
LYMPHS PCT: 29 % (ref 12–46)
Lymphs Abs: 1.9 10*3/uL (ref 0.7–4.0)
MCH: 28.1 pg (ref 26.0–34.0)
MCHC: 33.3 g/dL (ref 30.0–36.0)
MCV: 84.3 fL (ref 78.0–100.0)
MONO ABS: 0.6 10*3/uL (ref 0.1–1.0)
Monocytes Relative: 9 % (ref 3–12)
NEUTROS ABS: 4 10*3/uL (ref 1.7–7.7)
Neutrophils Relative %: 60 % (ref 43–77)
Platelets: 351 10*3/uL (ref 150–400)
RBC: 4.27 MIL/uL (ref 3.87–5.11)
RDW: 12.8 % (ref 11.5–15.5)
WBC: 6.7 10*3/uL (ref 4.0–10.5)

## 2014-12-05 LAB — I-STAT TROPONIN, ED: Troponin i, poc: 0 ng/mL (ref 0.00–0.08)

## 2014-12-05 MED ORDER — ONDANSETRON HCL 4 MG/2ML IJ SOLN
4.0000 mg | Freq: Four times a day (QID) | INTRAMUSCULAR | Status: DC | PRN
  Administered 2014-12-06: 4 mg via INTRAVENOUS
  Filled 2014-12-05: qty 2

## 2014-12-05 MED ORDER — ONDANSETRON HCL 4 MG PO TABS
4.0000 mg | ORAL_TABLET | Freq: Four times a day (QID) | ORAL | Status: DC | PRN
  Administered 2014-12-06: 4 mg via ORAL
  Filled 2014-12-05: qty 1

## 2014-12-05 MED ORDER — MELOXICAM 15 MG PO TABS
15.0000 mg | ORAL_TABLET | Freq: Every day | ORAL | Status: DC | PRN
  Filled 2014-12-05: qty 1

## 2014-12-05 MED ORDER — MINOXIDIL 2.5 MG PO TABS
2.5000 mg | ORAL_TABLET | Freq: Every day | ORAL | Status: DC
  Administered 2014-12-06: 2.5 mg via ORAL
  Filled 2014-12-05: qty 1

## 2014-12-05 MED ORDER — METOPROLOL TARTRATE 50 MG PO TABS
50.0000 mg | ORAL_TABLET | Freq: Two times a day (BID) | ORAL | Status: DC
  Administered 2014-12-05 – 2014-12-06 (×2): 50 mg via ORAL
  Filled 2014-12-05: qty 1
  Filled 2014-12-05: qty 2
  Filled 2014-12-05: qty 1

## 2014-12-05 MED ORDER — LOSARTAN POTASSIUM 50 MG PO TABS
50.0000 mg | ORAL_TABLET | Freq: Two times a day (BID) | ORAL | Status: DC
  Administered 2014-12-06 (×2): 50 mg via ORAL
  Filled 2014-12-05 (×3): qty 1

## 2014-12-05 MED ORDER — DIPHENHYDRAMINE HCL 50 MG/ML IJ SOLN
50.0000 mg | Freq: Once | INTRAMUSCULAR | Status: AC
  Administered 2014-12-05: 50 mg via INTRAVENOUS
  Filled 2014-12-05: qty 1

## 2014-12-05 MED ORDER — ONDANSETRON HCL 4 MG/2ML IJ SOLN
4.0000 mg | Freq: Once | INTRAMUSCULAR | Status: AC
  Administered 2014-12-05: 4 mg via INTRAVENOUS
  Filled 2014-12-05: qty 2

## 2014-12-05 MED ORDER — MECLIZINE HCL 25 MG PO TABS: 25.0000 mg | ORAL_TABLET | Freq: Once | ORAL | Status: DC

## 2014-12-05 MED ORDER — MECLIZINE HCL 25 MG PO TABS
25.0000 mg | ORAL_TABLET | Freq: Once | ORAL | Status: AC
  Administered 2014-12-05: 25 mg via ORAL
  Filled 2014-12-05: qty 1

## 2014-12-05 MED ORDER — DIAZEPAM 5 MG PO TABS
5.0000 mg | ORAL_TABLET | Freq: Once | ORAL | Status: AC
  Administered 2014-12-05: 5 mg via ORAL
  Filled 2014-12-05: qty 1

## 2014-12-05 MED ORDER — LOSARTAN POTASSIUM-HCTZ 100-12.5 MG PO TABS: 0.5000 | ORAL_TABLET | Freq: Two times a day (BID) | ORAL | Status: DC

## 2014-12-05 MED ORDER — HEPARIN SODIUM (PORCINE) 5000 UNIT/ML IJ SOLN
5000.0000 [IU] | Freq: Three times a day (TID) | INTRAMUSCULAR | Status: DC
  Filled 2014-12-05 (×5): qty 1

## 2014-12-05 MED ORDER — SODIUM CHLORIDE 0.9 % IV SOLN
1000.0000 mL | Freq: Once | INTRAVENOUS | Status: AC
  Administered 2014-12-05: 1000 mL via INTRAVENOUS

## 2014-12-05 MED ORDER — HYDROCHLOROTHIAZIDE 10 MG/ML ORAL SUSPENSION
6.2500 mg | Freq: Two times a day (BID) | ORAL | Status: DC
Start: 2014-12-05 — End: 2014-12-06
  Administered 2014-12-06 (×2): 6.25 mg via ORAL
  Filled 2014-12-05 (×3): qty 1.25

## 2014-12-05 MED ORDER — IBUPROFEN 200 MG PO TABS: 400.0000 mg | ORAL_TABLET | Freq: Four times a day (QID) | ORAL | Status: DC | PRN

## 2014-12-05 MED ORDER — MECLIZINE HCL 25 MG PO TABS
25.0000 mg | ORAL_TABLET | Freq: Three times a day (TID) | ORAL | Status: DC | PRN
  Administered 2014-12-06: 25 mg via ORAL
  Filled 2014-12-05 (×3): qty 1

## 2014-12-05 MED ORDER — MECLIZINE HCL 25 MG PO TABS
25.0000 mg | ORAL_TABLET | Freq: Once | ORAL | Status: DC
  Filled 2014-12-05: qty 1

## 2014-12-05 MED ORDER — AMLODIPINE BESYLATE 5 MG PO TABS
5.0000 mg | ORAL_TABLET | Freq: Every day | ORAL | Status: DC
  Administered 2014-12-06: 5 mg via ORAL
  Filled 2014-12-05: qty 1

## 2014-12-05 NOTE — ED Provider Notes (Signed)
CSN: [id number]     Arrival date & time 12/05/14  1543 History   First MD Initiated Contact with Patient 12/05/14 1550     Chief Complaint  Patient presents with  . Emesis  . Near Syncope     (Consider location/radiation/quality/duration/timing/severity/associated sxs/prior Treatment) HPI Jessica Gibson is a 69 y.o. female with a history of hyperlipidemia, hypertension who comes in for evaluation of acute nausea and vomiting. Patient states she went outside today to mow her lawn on her riding mower at approximately 11 AM, return inside, layed down in the bed and began to have a intense "head spinning sensation". She reports that every time she would turn her head from one side or the other would exacerbate the spinning sensation. She reports subsequent nausea and vomiting. She denies any headaches, chest pain, shortness of breath, abdominal pain, numbness or weakness.  Past Medical History  Diagnosis Date  . CIN I (cervical intraepithelial neoplasia I) 03/2004    LEEP  . Atrophic vaginitis   . Arthritis     KNEE  . Hypertension   . Hypercholesteremia   . Post-operative nausea and vomiting   . Snoring 02/28/2014  . Other malaise and fatigue 02/28/2014  . Heart murmur    Past Surgical History  Procedure Laterality Date  . 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 Arthroscopy of knee    . Cervical biopsy  w/ loop electrode excision  2005  . Joint replacement Right     knee   Family History  Problem Relation Age of Onset  . Diabetes Mother   . Hypertension Mother   . Heart disease Mother   . Heart disease Father   . Colon cancer Neg Hx   . Other Father     circulation problems   History  Substance Use Topics  . Smoking status: Never Smoker   . Smokeless tobacco: Never Used  . Alcohol Use: Yes     Comment: seldom   OB History    Gravida Para Term Preterm AB TAB SAB Ectopic Multiple Living   2 2        2      Review of Systems A 10 point review of systems was completed  and was negative except for pertinent positives and negatives as mentioned in the history of present illness     Allergies  Review of patient's allergies indicates no known allergies.  Home Medications   Prior to Admission medications   Medication Sig Start Date End Date Taking? Authorizing Provider  amLODipine (NORVASC) 5 MG tablet Take 5 mg by mouth daily.     Yes Historical Provider, MD  ergocalciferol (VITAMIN D2) 50000 UNITS capsule Take 50,000 Units by mouth every 30 (thirty) days.   Yes Historical Provider, MD  ibuprofen (ADVIL,MOTRIN) 200 MG tablet Take 400 mg by mouth every 6 (six) hours as needed for moderate pain (pain).   Yes Historical Provider, MD  losartan-hydrochlorothiazide (HYZAAR) 100-12.5 MG per tablet Take 0.5 tablets by mouth 2 (two) times daily.    Yes Historical Provider, MD  metoprolol (LOPRESSOR) 50 MG tablet Take 50 mg by mouth 2 (two) times daily.     Yes Historical Provider, MD  minoxidil (LONITEN) 2.5 MG tablet Take 2.5 mg by mouth daily.   Yes Historical Provider, MD  amoxicillin (AMOXIL) 500 MG capsule Take 2,000 mg by mouth as directed. TAKES ONE HOUR BEFORE DENTAL APPOINTMENT,     Historical Provider, MD  meloxicam (Frederick) 15  MG tablet Take 15 mg by mouth daily as needed for pain.     Historical Provider, MD   BP 128/56 mmHg  Pulse 69  Temp(Src) 97.8 F (36.6 C) (Oral)  Resp 19  Ht 5' 2.5" (1.588 m)  Wt 165 lb (74.844 kg)  BMI 29.68 kg/m2  SpO2 92% Physical Exam  Constitutional: She is oriented to person, place, and time. She appears well-developed and well-nourished.  HENT:  Head: Normocephalic and atraumatic.  Mouth/Throat: Oropharynx is clear and moist.  Eyes: Conjunctivae are normal. Pupils are equal, round, and reactive to light. Right eye exhibits no discharge. Left eye exhibits no discharge. No scleral icterus.  Neck: Neck supple.  Cardiovascular: Normal rate, regular rhythm and normal heart sounds.   Pulmonary/Chest: Effort normal and  breath sounds normal. No respiratory distress. She has no wheezes. She has no rales.  Abdominal: Soft. There is no tenderness.  Musculoskeletal: She exhibits no tenderness.  Neurological: She is alert and oriented to person, place, and time.  Cranial Nerves II-XII grossly intact. Motor and sensation 5/5 in all 4 extremities. Completes fine motor coordination movements without difficulty. Gait is slow but without ataxia.  Skin: Skin is warm and dry. No rash noted.  Psychiatric: She has a normal mood and affect.  Nursing note and vitals reviewed.   ED Course  Procedures (including critical care time) Labs Review Labs Reviewed  BASIC METABOLIC PANEL - Abnormal; Notable for the following:    Glucose, Bld 119 (*)    GFR calc non Af Amer 60 (*)    All other components within normal limits  CBC WITH DIFFERENTIAL/PLATELET  URINALYSIS, ROUTINE W REFLEX MICROSCOPIC (NOT AT [facility])  Randolm Idol, ED  Randolm Idol, ED    Imaging Review Dg Chest 2 View  12/05/2014   CLINICAL DATA:  69 year old female with dizziness, nausea and vomiting.  EXAM: CHEST  2 VIEW  COMPARISON:  Radiograph dated 07/19/2007  FINDINGS: The patient is slightly rotated to the left. There is no focal consolidation. No pleural effusion or pneumothorax noted. There is interval enlargement of the cardiomediastinal silhouette which may partly be related to rotated positioning of the patient. There is degenerative changes of the spine and osteopenia.  IMPRESSION: No focal consolidation.   Electronically Signed   By: Anner Crete M.D.   On: 12/05/2014 17:20   Ct Head Wo Contrast  12/05/2014   CLINICAL DATA:  69 year old female with persistent nausea and vomiting. Patient reports dizziness and diaphoresis.  EXAM: CT HEAD WITHOUT CONTRAST  TECHNIQUE: Contiguous axial images were obtained from the base of the skull through the vertex without intravenous contrast.  COMPARISON:  None.  FINDINGS: The ventricles and sulci are  appropriate in size for patient's age. Mild periventricular and deep white matter hypodensities represent chronic microvascular ischemic changes. Minimal bilateral basal ganglia new calcifications. There is no intracranial hemorrhage. No mass effect or midline shift identified.  The visualized paranasal sinuses and mastoid air cells are well aerated. The calvarium is intact.  IMPRESSION: No acute intracranial pathology.  Mild age-related atrophy and chronic microvascular ischemic disease.  If symptoms persist and there are no contraindications, MRI may provide better evaluation if clinically indicated   Electronically Signed   By: Anner Crete M.D.   On: 12/05/2014 17:29   Mr Brain Wo Contrast (neuro Protocol)  12/05/2014   CLINICAL DATA:  Persistent nausea and vomiting with dizziness and diaphoresis which began earlier today, after physical exertion.  EXAM: MRI HEAD WITHOUT CONTRAST  TECHNIQUE: Multiplanar, multiecho pulse sequences of the brain and surrounding structures were obtained without intravenous contrast.  COMPARISON:  CT head earlier today.  FINDINGS: No evidence for acute infarction, hemorrhage, mass lesion, hydrocephalus, or extra-axial fluid. Mild cerebral and cerebellar atrophy. Mild subcortical and periventricular T2 and FLAIR hyperintensities, likely chronic microvascular ischemic change. Flow voids are maintained throughout the carotid, basilar, and vertebral arteries. There are no areas of chronic hemorrhage. Pituitary, pineal, and cerebellar tonsils unremarkable. No upper cervical lesions. Visualized calvarium, skull base, and upper cervical osseous structures unremarkable. Scalp and extracranial soft tissues, orbits, sinuses, and mastoids show no acute process.  Good general agreement with prior CT.  IMPRESSION: Mild atrophy.  Mild small vessel disease.  No acute intracranial findings.   Electronically Signed   By: Staci Righter M.D.   On: 12/05/2014 21:36     EKG  Interpretation   Date/Time:  Wednesday December 05 2014 16:02:51 EDT Ventricular Rate:  59 PR Interval:  178 QRS Duration: 85 QT Interval:  443 QTC Calculation: 439 R Axis:   70 Text Interpretation:  Sinus rhythm Minimal ST depression, lateral leads No  significant change since last tracing Confirmed by Mingo Amber  MD, BLAIR  ([id number]) on 12/05/2014 4:08:10 PM     Meds given in ED:  Medications  amLODipine (NORVASC) tablet 5 mg (not administered)  ibuprofen (ADVIL,MOTRIN) tablet 400 mg (not administered)  losartan-hydrochlorothiazide (HYZAAR) 100-12.5 MG per tablet 0.5 tablet (not administered)  meloxicam (MOBIC) tablet 15 mg (not administered)  metoprolol tartrate (LOPRESSOR) tablet 50 mg (not administered)  minoxidil (LONITEN) tablet 2.5 mg (not administered)  meclizine (ANTIVERT) tablet 25 mg (not administered)  ondansetron (ZOFRAN) injection 4 mg (4 mg Intravenous Given 12/05/14 1615)  0.9 %  sodium chloride infusion (0 mLs Intravenous Stopped 12/05/14 2150)  diphenhydrAMINE (BENADRYL) injection 50 mg (50 mg Intravenous Given 12/05/14 1645)  diazepam (VALIUM) tablet 5 mg (5 mg Oral Given 12/05/14 2036)  meclizine (ANTIVERT) tablet 25 mg (25 mg Oral Given 12/05/14 2026)    New Prescriptions   No medications on file   Filed Vitals:   12/05/14 1552 12/05/14 1556 12/05/14 1842 12/05/14 2204  BP:  169/74 140/59 128/56  Pulse:  60 66 69  Temp:  97.5 F (36.4 C) 97.8 F (36.6 C) 97.8 F (36.6 C)  TempSrc:  Oral Oral Oral  Resp:  20 24 19   Height:  5' 2.5" (1.588 m)    Weight:  165 lb (74.844 kg)    SpO2: 98% 97% 93% 92%    MDM  Vitals stable - WNL -afebrile Pt resting comfortably in ED. feels much better after administration of IV Benadryl. Nausea has improved, spinning has subsided. PE--normal neuro exam. Normal cardiac and pulmonic exams. Physical exam is otherwise benign. Labwork--labs are noncontributory. Troponin negative, EKG is not concerning. Imaging--CT head chest x-ray  shows no acute pathology.  8: 20 p.m., upon reevaluation, patient states she still feels weak and is unable to walk or complete consecutive steps. Patient also reports that she lives alone and does not feel comfortable going home and her current state.  Obtain MR brain--no acute findings. Patient's symptoms likely secondary to benign positional vertigo. Doubt any central lesion. Discussed patient presentation and ED course with my attending, Dr. Mingo Amber who also saw and evaluated the patient and agrees with plan for admission to medicine service for intractable vertigo. Consult to hospitalist, patient admitted.  Final diagnoses:  BPV (benign positional vertigo), unspecified laterality  Comer Locket, PA-C 12/06/14 0002  Evelina Bucy, MD 12/06/14 [phone]

## 2014-12-05 NOTE — ED Notes (Signed)
Patient transported to CT 

## 2014-12-05 NOTE — ED Notes (Signed)
Bed: GY65 Expected date:  Expected time:  Means of arrival:  Comments: EMS- 69yo F, dizziness, n/v

## 2014-12-05 NOTE — ED Notes (Signed)
[facility] requests for Hyzaar and Heparin injection since doses are still missing from ED.

## 2014-12-05 NOTE — H&P (Signed)
[facility] History and Physical  Jessica Gibson [id number]:[id number] DOB: September 27, 1945 DOA: 12/05/2014  Referring physician: EDP PCP: Donnajean Lopes, MD   Chief Complaint: Dizziness   HPI: Jessica Gibson is a 69 y.o. female who presents to ED with c/o "head spinning sensation".  Symptoms onset after mowing lawn earlier today.  Worse with changes in position of head, better at rest.  No chest pain, no SOB, no abdominal pain.  Has had N/V after this started.  Review of Systems: Systems reviewed.  As above, otherwise negative  Past Medical History  Diagnosis Date  . CIN I (cervical intraepithelial neoplasia I) 03/2004    LEEP  . Atrophic vaginitis   . Arthritis     KNEE  . Hypertension   . Hypercholesteremia   . Post-operative nausea and vomiting   . Snoring 02/28/2014  . Other malaise and fatigue 02/28/2014  . Heart murmur    Past Surgical History  Procedure Laterality Date  . 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 Arthroscopy of knee    . Cervical biopsy  w/ loop electrode excision  2005  . Joint replacement Right     knee   Social History:  reports that she has never smoked. She has never used smokeless tobacco. She reports that she drinks alcohol. She reports that she does not use illicit drugs.  No Known Allergies  Family History  Problem Relation Age of Onset  . Diabetes Mother   . Hypertension Mother   . Heart disease Mother   . Heart disease Father   . Colon cancer Neg Hx   . Other Father     circulation problems     Prior to Admission medications   Medication Sig Start Date End Date Taking? Authorizing Provider  amLODipine (NORVASC) 5 MG tablet Take 5 mg by mouth daily.     Yes Historical Provider, MD  ergocalciferol (VITAMIN D2) 50000 UNITS capsule Take 50,000 Units by mouth every 30 (thirty) days.   Yes Historical Provider, MD  ibuprofen (ADVIL,MOTRIN) 200 MG tablet Take 400 mg by mouth every 6 (six) hours as needed for moderate pain (pain).   Yes  Historical Provider, MD  losartan-hydrochlorothiazide (HYZAAR) 100-12.5 MG per tablet Take 0.5 tablets by mouth 2 (two) times daily.    Yes Historical Provider, MD  metoprolol (LOPRESSOR) 50 MG tablet Take 50 mg by mouth 2 (two) times daily.     Yes Historical Provider, MD  minoxidil (LONITEN) 2.5 MG tablet Take 2.5 mg by mouth daily.   Yes Historical Provider, MD  amoxicillin (AMOXIL) 500 MG capsule Take 2,000 mg by mouth as directed. TAKES ONE HOUR BEFORE DENTAL APPOINTMENT,     Historical Provider, MD  meloxicam (MOBIC) 15 MG tablet Take 15 mg by mouth daily as needed for pain.     Historical Provider, MD   Physical Exam: Filed Vitals:   12/05/14 2204  BP: 128/56  Pulse: 69  Temp: 97.8 F (36.6 C)  Resp: 19    BP 128/56 mmHg  Pulse 69  Temp(Src) 97.8 F (36.6 C) (Oral)  Resp 19  Ht 5' 2.5" (1.588 m)  Wt 74.844 kg (165 lb)  BMI 29.68 kg/m2  SpO2 92%  General Appearance:    Alert, oriented, no distress, appears stated age  Head:    Normocephalic, atraumatic  Eyes:    PERRL, EOMI, sclera non-icteric        Nose:   Nares without drainage or epistaxis. Mucosa, turbinates  normal  Throat:   Moist mucous membranes. Oropharynx without erythema or exudate.  Neck:   Supple. No carotid bruits.  No thyromegaly.  No lymphadenopathy.   Back:     No CVA tenderness, no spinal tenderness  Lungs:     Clear to auscultation bilaterally, without wheezes, rhonchi or rales  Chest wall:    No tenderness to palpitation  Heart:    Regular rate and rhythm without murmurs, gallops, rubs  Abdomen:     Soft, non-tender, nondistended, normal bowel sounds, no organomegaly  Genitalia:    deferred  Rectal:    deferred  Extremities:   No clubbing, cyanosis or edema.  Pulses:   2+ and symmetric all extremities  Skin:   Skin color, texture, turgor normal, no rashes or lesions  Lymph nodes:   Cervical, supraclavicular, and axillary nodes normal  Neurologic:   CNII-XII intact. Normal strength, sensation and  reflexes      throughout    Labs on Admission:  Basic Metabolic Panel:  Recent Labs Lab 12/05/14 1616  NA 139  K 3.6  CL 106  CO2 23  GLUCOSE 119*  BUN 20  CREATININE 0.95  CALCIUM 9.0   Liver Function Tests: No results for input(s): AST, ALT, ALKPHOS, BILITOT, PROT, ALBUMIN in the last 168 hours. No results for input(s): LIPASE, AMYLASE in the last 168 hours. No results for input(s): AMMONIA in the last 168 hours. CBC:  Recent Labs Lab 12/05/14 1616  WBC 6.7  NEUTROABS 4.0  HGB 12.0  HCT 36.0  MCV 84.3  PLT 351   Cardiac Enzymes: No results for input(s): CKTOTAL, CKMB, CKMBINDEX, TROPONINI in the last 168 hours.  BNP (last 3 results) No results for input(s): PROBNP in the last 8760 hours. CBG: No results for input(s): GLUCAP in the last 168 hours.  Radiological Exams on Admission: Dg Chest 2 View  12/05/2014   CLINICAL DATA:  69 year old female with dizziness, nausea and vomiting.  EXAM: CHEST  2 VIEW  COMPARISON:  Radiograph dated 07/19/2007  FINDINGS: The patient is slightly rotated to the left. There is no focal consolidation. No pleural effusion or pneumothorax noted. There is interval enlargement of the cardiomediastinal silhouette which may partly be related to rotated positioning of the patient. There is degenerative changes of the spine and osteopenia.  IMPRESSION: No focal consolidation.   Electronically Signed   By: Anner Crete M.D.   On: 12/05/2014 17:20   Ct Head Wo Contrast  12/05/2014   CLINICAL DATA:  69 year old female with persistent nausea and vomiting. Patient reports dizziness and diaphoresis.  EXAM: CT HEAD WITHOUT CONTRAST  TECHNIQUE: Contiguous axial images were obtained from the base of the skull through the vertex without intravenous contrast.  COMPARISON:  None.  FINDINGS: The ventricles and sulci are appropriate in size for patient's age. Mild periventricular and deep white matter hypodensities represent chronic microvascular ischemic  changes. Minimal bilateral basal ganglia new calcifications. There is no intracranial hemorrhage. No mass effect or midline shift identified.  The visualized paranasal sinuses and mastoid air cells are well aerated. The calvarium is intact.  IMPRESSION: No acute intracranial pathology.  Mild age-related atrophy and chronic microvascular ischemic disease.  If symptoms persist and there are no contraindications, MRI may provide better evaluation if clinically indicated   Electronically Signed   By: Anner Crete M.D.   On: 12/05/2014 17:29   Mr Brain Wo Contrast (neuro Protocol)  12/05/2014   CLINICAL DATA:  Persistent nausea and vomiting with  dizziness and diaphoresis which began earlier today, after physical exertion.  EXAM: MRI HEAD WITHOUT CONTRAST  TECHNIQUE: Multiplanar, multiecho pulse sequences of the brain and surrounding structures were obtained without intravenous contrast.  COMPARISON:  CT head earlier today.  FINDINGS: No evidence for acute infarction, hemorrhage, mass lesion, hydrocephalus, or extra-axial fluid. Mild cerebral and cerebellar atrophy. Mild subcortical and periventricular T2 and FLAIR hyperintensities, likely chronic microvascular ischemic change. Flow voids are maintained throughout the carotid, basilar, and vertebral arteries. There are no areas of chronic hemorrhage. Pituitary, pineal, and cerebellar tonsils unremarkable. No upper cervical lesions. Visualized calvarium, skull base, and upper cervical osseous structures unremarkable. Scalp and extracranial soft tissues, orbits, sinuses, and mastoids show no acute process.  Good general agreement with prior CT.  IMPRESSION: Mild atrophy.  Mild small vessel disease.  No acute intracranial findings.   Electronically Signed   By: Staci Righter M.D.   On: 12/05/2014 21:36    EKG: Independently reviewed.  Assessment/Plan Principal Problem:   BPV (benign positional vertigo) Active Problems:   Hypertension   1. BPV - work up has  included MRI brain which is negative for stroke or other acute findings. 1. antivert 2. PT    Code Status: Full  Family Communication: No family in room Disposition Plan: Admit to obs   Time spent: 30 min  Gracielynn Birkel M. [facility] Pager [phone]  If 7AM-7PM, please contact the day team taking care of the patient [email or web address] Password [email or web address] 12/05/2014, 10:10 PM

## 2014-12-05 NOTE — ED Notes (Signed)
Patient transported to MRI 

## 2014-12-05 NOTE — ED Notes (Signed)
Pt presents via EMS c/o persistent nausea and vomiting as well as dizziness and diaphoresis since working out in the yard for several hours earlier today.  She went outside around 11am and began feeling ill at about 2:00-2:30.  She tried to lie down and rest but then began vomiting uncontrollably and experiencing vertigo-like symptoms.  No history of vertigo, cardiac issues, or related conditions to the pt's knowledge.  She is a/o x 4 and clearly verbal, responding apprioriately.  No neuro deficits noted during assessment.  EMXS administered 8mg  zofran with no improvement of vomiting.  500cc normal saline bolus infusing at this time.

## 2014-12-06 DIAGNOSIS — R42 Dizziness and giddiness: Secondary | ICD-10-CM | POA: Diagnosis not present

## 2014-12-06 DIAGNOSIS — H8110 Benign paroxysmal vertigo, unspecified ear: Secondary | ICD-10-CM | POA: Diagnosis not present

## 2014-12-06 DIAGNOSIS — I10 Essential (primary) hypertension: Secondary | ICD-10-CM | POA: Diagnosis not present

## 2014-12-06 MED ORDER — MECLIZINE HCL 25 MG PO TABS: 25.0000 mg | ORAL_TABLET | Freq: Two times a day (BID) | ORAL | Status: DC | PRN

## 2014-12-06 NOTE — Progress Notes (Signed)
Physical Therapy Treatment Note    12/06/14 1600  PT Visit Information  Last PT Received On 12/06/14  Assistance Needed +1  History of Present Illness Pt is a 69 year old female admitted for vertigo, nausea, and vomiting.  PT Time Calculation  PT Start Time (ACUTE ONLY) 1510  PT Stop Time (ACUTE ONLY) 1547  PT Time Calculation (min) (ACUTE ONLY) 37 min  Subjective Data  Subjective Pt assisted with performing BBQ.  Pt with great difficulty tolerating getting into position to start technique.  Nystagmus and spinning sensation present during maneuver.  Pt immediately with dry heaving and emesis upon sitting after BBQ roll.  Pt then assisted with ambulation, requiring min assist to steady.  Pt reporting feeling off balance during gait however denies dizziness/spinning.  Typically loses balance to right side.  Pt states her son can assist her home and stay with her.  She has RW, so encouraged her to use this upon d/c.  Pt believes her son can take her to outpatient therapy so provided referral handout.  If pt does remain, will check back tomorrow, otherwise pt is aware to follow up with outpatient for best outcomes.  Precautions  Precautions Fall  Pain Assessment  Pain Assessment No/denies pain  Cognition  Arousal/Alertness Awake/alert  Behavior During Therapy WFL for tasks assessed/performed  Overall Cognitive Status Within Functional Limits for tasks assessed  Bed Mobility  Overal bed mobility Modified Independent  General bed mobility comments slow, cautious  Transfers  Overall transfer level Needs assistance  Equipment used 2 person hand held assist  Transfers Sit to/from Stand  Sit to Stand Min assist  General transfer comment assist to rise and steady, pt very uncertain about mobility due to fearful of dizziness  Ambulation/Gait  Ambulation/Gait assistance Min assist  Ambulation Distance (Feet) 120 Feet  Assistive device 2 person hand held assist  General Gait Details very slow  cautious gait, denies dizziness, feeling off balance reported, tends to lose balance to right side, encouraged to use RW at home and have son stay with her for safety  Gait Pattern/deviations Step-through pattern;Staggering right;Decreased stride length  PT - End of Session  Equipment Utilized During Treatment Gait belt  Activity Tolerance (limited by nausea and vomiting)  Patient left in chair;with call bell/phone within reach  PT - Assessment/Plan  PT Plan Current plan remains appropriate  PT Frequency (ACUTE ONLY) Min 3X/week  Follow Up Recommendations Outpatient PT (would best benefit from vestibular PT at outpatient neuroreh)  PT equipment None recommended by PT  PT Goal Progression  Progress towards PT goals Progressing toward goals  PT G-Codes **NOT FOR INPATIENT CLASS**  Functional Assessment Tool Used clinical judgement, Positive Dix Hallpike and horizontal testing  Functional Limitation Mobility: Walking and moving around  Mobility: Walking and Moving Around Current Status [id number]) CJ  Mobility: Walking and Moving Around Goal Status (W3888) CI  PT General Charges  $$ ACUTE PT VISIT 1 Procedure  PT Treatments  $Gait Training 8-22 mins  $Canalith Rep Proc 8-22 mins   Carmelia Bake, PT, DPT 12/06/2014 Pager: [phone]

## 2014-12-06 NOTE — Discharge Summary (Signed)
Physician Discharge Summary  Jessica Gibson VHQ:[id number] DOB: [birth year] DOA: 12/05/2014  PCP: Donnajean Lopes, MD  Admit date: 12/05/2014 Discharge date: 12/06/2014  Recommendations for Outpatient Follow-up:  1. Please note her MRI did not show acute stroke. 2. Please note addition of meclizine on discharge   Discharge Diagnoses:  Principal Problem:   BPV (benign positional vertigo) Active Problems:   Hypertension    Discharge Condition: stable   Diet recommendation: as tolerated   History of present illness:  69 y.o. Female with past medical history of hypertension who presented to ED with reports of dizziness ongoing for last few weeks but worse in last day or so. No associated  lightheadedness or loss of consciousness. No chest pain, shortness of breath or palpitations.    Hospital Course:   Principal Problem:   BPV (benign positional vertigo) - Likely vertigo cause of dizziness - She needs outpt therapy - Meclizine on discharge  Active Problems:   Hypertension, essential - May resume home meds    Signed:  Leisa Lenz, MD  [facility] 12/06/2014, 9:39 AM  Pager #: [phone]  Time spent in minutes: less than 30 minutes   Discharge Exam: Filed Vitals:   12/06/14 0519  BP: 139/68  Pulse: 59  Temp: 98.4 F (36.9 C)  Resp: 16   Filed Vitals:   12/05/14 2310 12/05/14 2350 12/06/14 0019 12/06/14 0519  BP: 128/56 146/67 138/58 139/68  Pulse: 68 60  59  Temp: 97.9 F (36.6 C) 98.1 F (36.7 C)  98.4 F (36.9 C)  TempSrc: Oral Oral  Oral  Resp: 20 18  16   Height:      Weight:      SpO2: 93% 95%  94%    General: Pt is alert, follows commands appropriately, not in acute distress Cardiovascular: Regular rate and rhythm, S1/S2 +, no murmurs Respiratory: Clear to auscultation bilaterally, no wheezing, no crackles, no rhonchi Abdominal: Soft, non tender, non distended, bowel sounds +, no guarding Extremities: no edema, no cyanosis, pulses  palpable bilaterally DP and PT Neuro: Grossly nonfocal  Discharge Instructions  Discharge Instructions    Call MD for:  difficulty breathing, headache or visual disturbances    Complete by:  As directed      Call MD for:  persistant nausea and vomiting    Complete by:  As directed      Call MD for:  severe uncontrolled pain    Complete by:  As directed      Diet - low sodium heart healthy    Complete by:  As directed      Discharge instructions    Complete by:  As directed   1. Please note her MRI did not show acute stroke.     Increase activity slowly    Complete by:  As directed             Medication List    STOP taking these medications        amoxicillin 500 MG capsule  Commonly known as:  AMOXIL      TAKE these medications        amLODipine 5 MG tablet  Commonly known as:  NORVASC  Take 5 mg by mouth daily.     ergocalciferol 50000 UNITS capsule  Commonly known as:  VITAMIN D2  Take 50,000 Units by mouth every 30 (thirty) days.     ibuprofen 200 MG tablet  Commonly known as:  ADVIL,MOTRIN  Take 400 mg by mouth every  6 (six) hours as needed for moderate pain (pain).     losartan-hydrochlorothiazide 100-12.5 MG per tablet  Commonly known as:  HYZAAR  Take 0.5 tablets by mouth 2 (two) times daily.     meloxicam 15 MG tablet  Commonly known as:  MOBIC  Take 15 mg by mouth daily as needed for pain.     metoprolol 50 MG tablet  Commonly known as:  LOPRESSOR  Take 50 mg by mouth 2 (two) times daily.     minoxidil 2.5 MG tablet  Commonly known as:  LONITEN  Take 2.5 mg by mouth daily.           Follow-up Information    Follow up with Donnajean Lopes, MD. Schedule an appointment as soon as possible for a visit in 1 week.   Specialty:  Internal Medicine   Why:  Follow up appt after recent hospitalization   Contact information:   [place] [address] [phone]        The results of significant diagnostics from this  hospitalization (including imaging, microbiology, ancillary and laboratory) are listed below for reference.    Significant Diagnostic Studies: Dg Chest 2 View  12/05/2014   CLINICAL DATA:  69 year old female with dizziness, nausea and vomiting.  EXAM: CHEST  2 VIEW  COMPARISON:  Radiograph dated 07/19/2007  FINDINGS: The patient is slightly rotated to the left. There is no focal consolidation. No pleural effusion or pneumothorax noted. There is interval enlargement of the cardiomediastinal silhouette which may partly be related to rotated positioning of the patient. There is degenerative changes of the spine and osteopenia.  IMPRESSION: No focal consolidation.   Electronically Signed   By: Anner Crete M.D.   On: 12/05/2014 17:20   Ct Head Wo Contrast  12/05/2014   CLINICAL DATA:  69 year old female with persistent nausea and vomiting. Patient reports dizziness and diaphoresis.  EXAM: CT HEAD WITHOUT CONTRAST  TECHNIQUE: Contiguous axial images were obtained from the base of the skull through the vertex without intravenous contrast.  COMPARISON:  None.  FINDINGS: The ventricles and sulci are appropriate in size for patient's age. Mild periventricular and deep white matter hypodensities represent chronic microvascular ischemic changes. Minimal bilateral basal ganglia new calcifications. There is no intracranial hemorrhage. No mass effect or midline shift identified.  The visualized paranasal sinuses and mastoid air cells are well aerated. The calvarium is intact.  IMPRESSION: No acute intracranial pathology.  Mild age-related atrophy and chronic microvascular ischemic disease.  If symptoms persist and there are no contraindications, MRI may provide better evaluation if clinically indicated   Electronically Signed   By: Anner Crete M.D.   On: 12/05/2014 17:29   Mr Brain Wo Contrast (neuro Protocol)  12/05/2014   CLINICAL DATA:  Persistent nausea and vomiting with dizziness and diaphoresis which  began earlier today, after physical exertion.  EXAM: MRI HEAD WITHOUT CONTRAST  TECHNIQUE: Multiplanar, multiecho pulse sequences of the brain and surrounding structures were obtained without intravenous contrast.  COMPARISON:  CT head earlier today.  FINDINGS: No evidence for acute infarction, hemorrhage, mass lesion, hydrocephalus, or extra-axial fluid. Mild cerebral and cerebellar atrophy. Mild subcortical and periventricular T2 and FLAIR hyperintensities, likely chronic microvascular ischemic change. Flow voids are maintained throughout the carotid, basilar, and vertebral arteries. There are no areas of chronic hemorrhage. Pituitary, pineal, and cerebellar tonsils unremarkable. No upper cervical lesions. Visualized calvarium, skull base, and upper cervical osseous structures unremarkable. Scalp and extracranial soft tissues, orbits, sinuses, and  mastoids show no acute process.  Good general agreement with prior CT.  IMPRESSION: Mild atrophy.  Mild small vessel disease.  No acute intracranial findings.   Electronically Signed   By: Staci Righter M.D.   On: 12/05/2014 21:36    Microbiology: No results found for this or any previous visit (from the past 240 hour(s)).   Labs: Basic Metabolic Panel:  Recent Labs Lab 12/05/14 1616  NA 139  K 3.6  CL 106  CO2 23  GLUCOSE 119*  BUN 20  CREATININE 0.95  CALCIUM 9.0   Liver Function Tests: No results for input(s): AST, ALT, ALKPHOS, BILITOT, PROT, ALBUMIN in the last 168 hours. No results for input(s): LIPASE, AMYLASE in the last 168 hours. No results for input(s): AMMONIA in the last 168 hours. CBC:  Recent Labs Lab 12/05/14 1616  WBC 6.7  NEUTROABS 4.0  HGB 12.0  HCT 36.0  MCV 84.3  PLT 351   Cardiac Enzymes: No results for input(s): CKTOTAL, CKMB, CKMBINDEX, TROPONINI in the last 168 hours. BNP: BNP (last 3 results) No results for input(s): BNP in the last 8760 hours.  ProBNP (last 3 results) No results for input(s): PROBNP  in the last 8760 hours.  CBG: No results for input(s): GLUCAP in the last 168 hours.

## 2014-12-06 NOTE — Evaluation (Signed)
Physical Therapy Evaluation Patient Details Name: Jessica Gibson MRN: [id number] DOB: 24-May-1946 Today's Date: 12/06/2014   History of Present Illness  Pt is a 69 year old female admitted for vertigo, nausea, and vomiting.  Clinical Impression  Pt admitted with above diagnosis. Pt currently with functional limitations due to the deficits listed below (see PT Problem List).  Pt will benefit from skilled PT to increase their independence and safety with mobility to allow discharge to the venue listed below.    Pt reports spinning sensation started upon her waking up at night prior to admission.  She states relieved with rest and laying still, aggravated with any mobility and head movement.  Pt reports short lived spinning upon rolling to right this morning.   Pt denies hitting head.  Pt performed Dix hallpike and positive nystagmus observed on L and R with R being worse.  Also performed horizontal testing with both sides positive for nystagmus however R side worse.   Overall nystagmus and symptoms were worse with R side horizontal testing.  All nystagmus short duration = canalithiasis.  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   Did not initiate treatment upon evaluation or mobilize after testing due to nausea and emesis just after completing BPPV testing.     Follow Up Recommendations Outpatient PT (would best benefit from vestibular PT at outpatient neurorehab if pt able to transport, may need to start with HHPT (will need vestibular PT))    Equipment Recommendations  None recommended by PT (TBA)    Recommendations for Other Services       Precautions / Restrictions Precautions Precautions: Fall      Mobility  Bed Mobility Overal bed mobility: Modified Independent             General bed mobility comments: slow, cautious  Transfers Overall transfer level:  (deferred OOB due to emesis after vertigo testing)                  Ambulation/Gait                Stairs            Wheelchair Mobility    Modified Rankin (Stroke Patients Only)       Balance                                             Pertinent Vitals/Pain Pain Assessment: No/denies pain    Home Living Family/patient expects to be discharged to:: Private residence Living Arrangements: Alone   Type of Home: House         Home Equipment: None      Prior Function Level of Independence: Independent               Hand Dominance        Extremity/Trunk Assessment   Upper Extremity Assessment: Overall WFL for tasks assessed           Lower Extremity Assessment: Overall WFL for tasks assessed         Communication   Communication: No difficulties  Cognition Arousal/Alertness: Awake/alert Behavior During Therapy: WFL for tasks assessed/performed Overall Cognitive Status: Within Functional Limits for tasks assessed  General Comments      Exercises        Assessment/Plan    PT Assessment Patient needs continued PT services  PT Diagnosis Difficulty walking;Other (comment) (BPPV)   PT Problem List Decreased balance;Decreased mobility;Other (comment) (BPPV)  PT Treatment Interventions DME instruction;Gait training;Functional mobility training;Therapeutic activities;Therapeutic exercise;Other (comment) (BPPV education, CRT, compensation strategies)   PT Goals (Current goals can be found in the Care Plan section) Acute Rehab PT Goals Patient Stated Goal: stop spinning PT Goal Formulation: With patient Time For Goal Achievement: 12/13/14 Potential to Achieve Goals: Good    Frequency Min 3X/week   Barriers to discharge        Co-evaluation               End of Session   Activity Tolerance: Other (comment) (limited by nausea and vomiting) Patient left: in bed;with call bell/phone within reach Nurse Communication:  Mobility status (requested nausea medication and asked RN to start antivert)    Functional Assessment Tool Used: clinical judgement, Positive Dix Hallpike and horizontal testing Functional Limitation: Mobility: Walking and moving around Mobility: Walking and Moving Around Current Status [phone]): 100 percent impaired, limited or restricted Mobility: Walking and Moving Around Goal Status [phone]): At least 1 percent but less than 20 percent impaired, limited or restricted    Time: 1103-1130 PT Time Calculation (min) (ACUTE ONLY): 27 min   Charges:   PT Evaluation $Initial PT Evaluation Tier I: 1 Procedure     PT G Codes:   PT G-Codes **NOT FOR INPATIENT CLASS** Functional Assessment Tool Used: clinical judgement, Positive Dix Hallpike and horizontal testing Functional Limitation: Mobility: Walking and moving around Mobility: Walking and Moving Around Current Status [phone]): 100 percent impaired, limited or restricted Mobility: Walking and Moving Around Goal Status [phone]): At least 1 percent but less than 20 percent impaired, limited or restricted    Lacinda Curvin,KATHrine E 12/06/2014, 1:06 PM Carmelia Bake, PT, DPT 12/06/2014 Pager: [phone]

## 2014-12-06 NOTE — Discharge Summary (Signed)
Reviewed d/c instructions with pt and family including follow-up appointment, medications, and plans for [facility] follow-up.  Pt verbalized understanding of all topics discussed.  Pt being d/c to home into care of son.

## 2014-12-06 NOTE — Discharge Instructions (Signed)
Dizziness   Dizziness means you feel unsteady or lightheaded. You might feel like you are going to pass out (faint).  HOME CARE   · Drink enough fluids to keep your pee (urine) clear or pale yellow.  · Take your medicines exactly as told by your doctor. If you take blood pressure medicine, always stand up slowly from the lying or sitting position. Hold on to something to steady yourself.  · If you need to stand in one place for a long time, move your legs often. Tighten and relax your leg muscles.  · Have someone stay with you until you feel okay.  · Do not drive or use heavy machinery if you feel dizzy.  · Do not drink alcohol.  GET HELP RIGHT AWAY IF:   · You feel dizzy or lightheaded and it gets worse.  · You feel sick to your stomach (nauseous), or you throw up (vomit).  · You have trouble talking or walking.  · You feel weak or have trouble using your arms, hands, or legs.  · You cannot think clearly or have trouble forming sentences.  · You have chest pain, belly (abdominal) pain, sweating, or you are short of breath.  · Your vision changes.  · You are bleeding.  · You have problems from your medicine that seem to be getting worse.  MAKE SURE YOU:   · Understand these instructions.  · Will watch your condition.  · Will get help right away if you are not doing well or get worse.  Document Released: 05/14/2011 Document Revised: 08/17/2011 Document Reviewed: 05/14/2011  ExitCare® Patient Information ©2015 [employer]. This information is not intended to replace advice given to you by your health care provider. Make sure you discuss any questions you have with your health care provider.

## 2014-12-06 NOTE — Care Management Note (Signed)
Case Management Note  Patient Details  Name: TALEA MANGES MRN: [id number] Date of Birth: January 14, 1946  Subjective/Objective:                   Dizziness Action/Plan: Discharge planning  Expected Discharge Date:  12/06/14               Expected Discharge Plan:  [place]  In-House Referral:     Discharge planning Services  CM Consult  Post Acute Care Choice:  Home Health Choice offered to:     DME Arranged:    DME Agency:  [place] Arranged:  PT, OT [facility] Agency:  [place]  Status of Service:  Completed, signed off  Medicare Important Message Given:    Date Medicare IM Given:    Medicare IM give by:    Date Additional Medicare IM Given:    Additional Medicare Important Message give by:     If discussed at [place] of Stay Meetings, dates discussed:    Additional Comments: CM met with pt in room to offer choice of home health agency.  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  Referral called to [facility] rep, Kristen for HHPT/OT (vestibular).  No other CM needs were communicated. Dellie Catholic, RN 12/06/2014, 5:10 PM

## 2014-12-10 DIAGNOSIS — E785 Hyperlipidemia, unspecified: Secondary | ICD-10-CM | POA: Diagnosis not present

## 2014-12-10 DIAGNOSIS — Z96651 Presence of right artificial knee joint: Secondary | ICD-10-CM | POA: Diagnosis not present

## 2014-12-10 DIAGNOSIS — H8111 Benign paroxysmal vertigo, right ear: Secondary | ICD-10-CM | POA: Diagnosis not present

## 2014-12-10 DIAGNOSIS — I10 Essential (primary) hypertension: Secondary | ICD-10-CM | POA: Diagnosis not present

## 2014-12-12 DIAGNOSIS — Z96651 Presence of right artificial knee joint: Secondary | ICD-10-CM | POA: Diagnosis not present

## 2014-12-12 DIAGNOSIS — H8111 Benign paroxysmal vertigo, right ear: Secondary | ICD-10-CM | POA: Diagnosis not present

## 2014-12-12 DIAGNOSIS — I10 Essential (primary) hypertension: Secondary | ICD-10-CM | POA: Diagnosis not present

## 2014-12-12 DIAGNOSIS — E785 Hyperlipidemia, unspecified: Secondary | ICD-10-CM | POA: Diagnosis not present

## 2014-12-14 DIAGNOSIS — E785 Hyperlipidemia, unspecified: Secondary | ICD-10-CM | POA: Diagnosis not present

## 2014-12-14 DIAGNOSIS — Z96651 Presence of right artificial knee joint: Secondary | ICD-10-CM | POA: Diagnosis not present

## 2014-12-14 DIAGNOSIS — I10 Essential (primary) hypertension: Secondary | ICD-10-CM | POA: Diagnosis not present

## 2014-12-14 DIAGNOSIS — H8111 Benign paroxysmal vertigo, right ear: Secondary | ICD-10-CM | POA: Diagnosis not present

## 2014-12-17 DIAGNOSIS — E785 Hyperlipidemia, unspecified: Secondary | ICD-10-CM | POA: Diagnosis not present

## 2014-12-17 DIAGNOSIS — Z96651 Presence of right artificial knee joint: Secondary | ICD-10-CM | POA: Diagnosis not present

## 2014-12-17 DIAGNOSIS — I10 Essential (primary) hypertension: Secondary | ICD-10-CM | POA: Diagnosis not present

## 2014-12-17 DIAGNOSIS — H8111 Benign paroxysmal vertigo, right ear: Secondary | ICD-10-CM | POA: Diagnosis not present

## 2015-01-17 DIAGNOSIS — Z0100 Encounter for examination of eyes and vision without abnormal findings: Secondary | ICD-10-CM | POA: Diagnosis not present

## 2015-01-17 DIAGNOSIS — H2513 Age-related nuclear cataract, bilateral: Secondary | ICD-10-CM | POA: Diagnosis not present

## 2015-01-17 DIAGNOSIS — H40053 Ocular hypertension, bilateral: Secondary | ICD-10-CM | POA: Diagnosis not present

## 2015-02-13 DIAGNOSIS — I10 Essential (primary) hypertension: Secondary | ICD-10-CM | POA: Diagnosis not present

## 2015-02-13 DIAGNOSIS — E785 Hyperlipidemia, unspecified: Secondary | ICD-10-CM | POA: Diagnosis not present

## 2015-02-20 DIAGNOSIS — F5104 Psychophysiologic insomnia: Secondary | ICD-10-CM | POA: Diagnosis not present

## 2015-02-20 DIAGNOSIS — Z1389 Encounter for screening for other disorder: Secondary | ICD-10-CM | POA: Diagnosis not present

## 2015-02-20 DIAGNOSIS — K5790 Diverticulosis of intestine, part unspecified, without perforation or abscess without bleeding: Secondary | ICD-10-CM | POA: Diagnosis not present

## 2015-02-20 DIAGNOSIS — M545 Low back pain: Secondary | ICD-10-CM | POA: Diagnosis not present

## 2015-02-20 DIAGNOSIS — H9319 Tinnitus, unspecified ear: Secondary | ICD-10-CM | POA: Diagnosis not present

## 2015-02-20 DIAGNOSIS — N200 Calculus of kidney: Secondary | ICD-10-CM | POA: Diagnosis not present

## 2015-02-20 DIAGNOSIS — E785 Hyperlipidemia, unspecified: Secondary | ICD-10-CM | POA: Diagnosis not present

## 2015-02-20 DIAGNOSIS — Z0000 Encounter for general adult medical examination without abnormal findings: Secondary | ICD-10-CM | POA: Diagnosis not present

## 2015-02-20 DIAGNOSIS — G4733 Obstructive sleep apnea (adult) (pediatric): Secondary | ICD-10-CM | POA: Diagnosis not present

## 2015-02-20 DIAGNOSIS — K279 Peptic ulcer, site unspecified, unspecified as acute or chronic, without hemorrhage or perforation: Secondary | ICD-10-CM | POA: Diagnosis not present

## 2015-02-20 DIAGNOSIS — I10 Essential (primary) hypertension: Secondary | ICD-10-CM | POA: Diagnosis not present

## 2015-02-20 DIAGNOSIS — Z6829 Body mass index (BMI) 29.0-29.9, adult: Secondary | ICD-10-CM | POA: Diagnosis not present

## 2015-02-27 DIAGNOSIS — Z1212 Encounter for screening for malignant neoplasm of rectum: Secondary | ICD-10-CM | POA: Diagnosis not present

## 2015-03-21 DIAGNOSIS — H9313 Tinnitus, bilateral: Secondary | ICD-10-CM | POA: Diagnosis not present

## 2015-03-21 DIAGNOSIS — H6121 Impacted cerumen, right ear: Secondary | ICD-10-CM | POA: Diagnosis not present

## 2015-03-21 DIAGNOSIS — R42 Dizziness and giddiness: Secondary | ICD-10-CM | POA: Diagnosis not present

## 2015-04-04 DIAGNOSIS — H9313 Tinnitus, bilateral: Secondary | ICD-10-CM | POA: Diagnosis not present

## 2015-04-04 DIAGNOSIS — H8111 Benign paroxysmal vertigo, right ear: Secondary | ICD-10-CM | POA: Diagnosis not present

## 2015-04-04 DIAGNOSIS — R42 Dizziness and giddiness: Secondary | ICD-10-CM | POA: Diagnosis not present

## 2015-08-20 DIAGNOSIS — K279 Peptic ulcer, site unspecified, unspecified as acute or chronic, without hemorrhage or perforation: Secondary | ICD-10-CM | POA: Diagnosis not present

## 2015-08-20 DIAGNOSIS — E784 Other hyperlipidemia: Secondary | ICD-10-CM | POA: Diagnosis not present

## 2015-08-20 DIAGNOSIS — I10 Essential (primary) hypertension: Secondary | ICD-10-CM | POA: Diagnosis not present

## 2015-08-20 DIAGNOSIS — Z1389 Encounter for screening for other disorder: Secondary | ICD-10-CM | POA: Diagnosis not present

## 2015-08-20 DIAGNOSIS — Z6830 Body mass index (BMI) 30.0-30.9, adult: Secondary | ICD-10-CM | POA: Diagnosis not present

## 2015-08-20 DIAGNOSIS — G4733 Obstructive sleep apnea (adult) (pediatric): Secondary | ICD-10-CM | POA: Diagnosis not present

## 2015-08-28 DIAGNOSIS — Z1231 Encounter for screening mammogram for malignant neoplasm of breast: Secondary | ICD-10-CM | POA: Diagnosis not present

## 2015-09-09 ENCOUNTER — Encounter: Payer: Self-pay | Admitting: Gynecology

## 2015-09-10 DIAGNOSIS — G4733 Obstructive sleep apnea (adult) (pediatric): Secondary | ICD-10-CM | POA: Diagnosis not present

## 2015-09-10 DIAGNOSIS — J343 Hypertrophy of nasal turbinates: Secondary | ICD-10-CM | POA: Diagnosis not present

## 2015-10-24 ENCOUNTER — Ambulatory Visit (INDEPENDENT_AMBULATORY_CARE_PROVIDER_SITE_OTHER): Payer: Medicare Other | Admitting: Gynecology

## 2015-10-24 ENCOUNTER — Encounter: Payer: Self-pay | Admitting: Gynecology

## 2015-10-24 VITALS — BP 132/80 | Ht 62.0 in | Wt 166.0 lb

## 2015-10-24 DIAGNOSIS — N898 Other specified noninflammatory disorders of vagina: Secondary | ICD-10-CM

## 2015-10-24 DIAGNOSIS — Z01419 Encounter for gynecological examination (general) (routine) without abnormal findings: Secondary | ICD-10-CM | POA: Diagnosis not present

## 2015-10-24 DIAGNOSIS — N952 Postmenopausal atrophic vaginitis: Secondary | ICD-10-CM

## 2015-10-24 NOTE — Patient Instructions (Signed)

## 2015-10-24 NOTE — Progress Notes (Signed)
    Jessica Gibson [birth year] [id number]        70 y.o.  G2P2  for breast and pelvic exam. Doing well without complaints  Past medical history,surgical history, problem list, medications, allergies, family history and social history were all reviewed and documented as reviewed in the EPIC chart.  ROS:  Performed with pertinent positives and negatives included in the history, assessment and plan.   Additional significant findings :  none   Exam: [name] assistant Filed Vitals:   10/24/15 1105  BP: 132/80  Height: 5\' 2"  (1.575 m)  Weight: 166 lb (75.297 kg)   General appearance:  Normal affect, orientation and appearance. Skin: Grossly normal HEENT: Without gross lesions.  No cervical or supraclavicular adenopathy. Thyroid normal.  Lungs:  Clear without wheezing, rales or rhonchi Cardiac: RR, without RMG Abdominal:  Soft, nontender, without masses, guarding, rebound, organomegaly or hernia Breasts:  Examined lying and sitting without masses, retractions, discharge or axillary adenopathy. Pelvic:  Ext/BUS/vagina with atrophic changes. Small submucosal nodule 2 fingerbreadths within introitus along posterior vaginal wall. Mobile with no overlying mucosal defects  Cervix with atrophic changes  Uterus anteverted, normal size, shape and contour, midline and mobile nontender   Adnexa without masses or tenderness    Anus and perineum normal   Rectovaginal normal sphincter tone without palpated masses or tenderness.    Assessment/Plan:  70 y.o. G2P2 female for breast and pelvic exam.   1. Postmenopausal/atrophic genital changes. Without significant hot flushes, night sweats, vaginal dryness or any vaginal bleeding. Continue monitoring report any issues or vaginal bleeding.  2. Small posterior vaginal wall nodule, probable cyst present unchanged from prior exams. Asymptomatic to the patient. We'll continue to monitor with annual exams. 3. Pap smear 2015. No Pap smear done today. 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 Normal Pap smear since then. 4. Mammography 08/2015. Continue with annual mammography when due. SBE monthly reviewed. 5. Colonoscopy 2014. Repeat at their recommended interval. 6. DEXA 2013 reported normal. Patient will follow up with her primary physician for continued bone monitoring. Increase calcium vitamin D. 7. Health maintenance. No routine lab work done as patient does this at her primary physician's office. Follow up 1 year, sooner as needed.   Anastasio Auerbach MD, 11:29 AM 10/24/2015

## 2016-01-08 DIAGNOSIS — L218 Other seborrheic dermatitis: Secondary | ICD-10-CM | POA: Diagnosis not present

## 2016-01-08 DIAGNOSIS — D225 Melanocytic nevi of trunk: Secondary | ICD-10-CM | POA: Diagnosis not present

## 2016-01-08 DIAGNOSIS — L918 Other hypertrophic disorders of the skin: Secondary | ICD-10-CM | POA: Diagnosis not present

## 2016-01-08 DIAGNOSIS — D2271 Melanocytic nevi of right lower limb, including hip: Secondary | ICD-10-CM | POA: Diagnosis not present

## 2016-01-08 DIAGNOSIS — D2262 Melanocytic nevi of left upper limb, including shoulder: Secondary | ICD-10-CM | POA: Diagnosis not present

## 2016-01-08 DIAGNOSIS — D485 Neoplasm of uncertain behavior of skin: Secondary | ICD-10-CM | POA: Diagnosis not present

## 2016-01-08 DIAGNOSIS — L814 Other melanin hyperpigmentation: Secondary | ICD-10-CM | POA: Diagnosis not present

## 2016-01-08 DIAGNOSIS — D1801 Hemangioma of skin and subcutaneous tissue: Secondary | ICD-10-CM | POA: Diagnosis not present

## 2016-01-08 DIAGNOSIS — Z85828 Personal history of other malignant neoplasm of skin: Secondary | ICD-10-CM | POA: Diagnosis not present

## 2016-01-08 DIAGNOSIS — L821 Other seborrheic keratosis: Secondary | ICD-10-CM | POA: Diagnosis not present

## 2016-01-08 DIAGNOSIS — D0439 Carcinoma in situ of skin of other parts of face: Secondary | ICD-10-CM | POA: Diagnosis not present

## 2016-01-30 DIAGNOSIS — Z85828 Personal history of other malignant neoplasm of skin: Secondary | ICD-10-CM | POA: Diagnosis not present

## 2016-01-30 DIAGNOSIS — D0439 Carcinoma in situ of skin of other parts of face: Secondary | ICD-10-CM | POA: Diagnosis not present

## 2016-02-20 DIAGNOSIS — E784 Other hyperlipidemia: Secondary | ICD-10-CM | POA: Diagnosis not present

## 2016-02-20 DIAGNOSIS — I10 Essential (primary) hypertension: Secondary | ICD-10-CM | POA: Diagnosis not present

## 2016-02-27 DIAGNOSIS — G4733 Obstructive sleep apnea (adult) (pediatric): Secondary | ICD-10-CM | POA: Diagnosis not present

## 2016-02-27 DIAGNOSIS — E784 Other hyperlipidemia: Secondary | ICD-10-CM | POA: Diagnosis not present

## 2016-02-27 DIAGNOSIS — Z6829 Body mass index (BMI) 29.0-29.9, adult: Secondary | ICD-10-CM | POA: Diagnosis not present

## 2016-02-27 DIAGNOSIS — L988 Other specified disorders of the skin and subcutaneous tissue: Secondary | ICD-10-CM | POA: Diagnosis not present

## 2016-02-27 DIAGNOSIS — I10 Essential (primary) hypertension: Secondary | ICD-10-CM | POA: Diagnosis not present

## 2016-02-27 DIAGNOSIS — M545 Low back pain: Secondary | ICD-10-CM | POA: Diagnosis not present

## 2016-02-27 DIAGNOSIS — Z23 Encounter for immunization: Secondary | ICD-10-CM | POA: Diagnosis not present

## 2016-02-27 DIAGNOSIS — Z0000 Encounter for general adult medical examination without abnormal findings: Secondary | ICD-10-CM | POA: Diagnosis not present

## 2016-02-27 DIAGNOSIS — Z1212 Encounter for screening for malignant neoplasm of rectum: Secondary | ICD-10-CM | POA: Diagnosis not present

## 2016-02-27 DIAGNOSIS — N200 Calculus of kidney: Secondary | ICD-10-CM | POA: Diagnosis not present

## 2016-06-04 DIAGNOSIS — L910 Hypertrophic scar: Secondary | ICD-10-CM | POA: Diagnosis not present

## 2016-06-04 DIAGNOSIS — Z85828 Personal history of other malignant neoplasm of skin: Secondary | ICD-10-CM | POA: Diagnosis not present

## 2016-09-01 DIAGNOSIS — Z1231 Encounter for screening mammogram for malignant neoplasm of breast: Secondary | ICD-10-CM | POA: Diagnosis not present

## 2016-09-02 ENCOUNTER — Encounter: Payer: Self-pay | Admitting: Gynecology

## 2016-09-03 DIAGNOSIS — N6489 Other specified disorders of breast: Secondary | ICD-10-CM | POA: Diagnosis not present

## 2016-09-07 ENCOUNTER — Encounter: Payer: Self-pay | Admitting: Gynecology

## 2016-11-04 ENCOUNTER — Encounter: Payer: Self-pay | Admitting: Gynecology

## 2016-11-04 ENCOUNTER — Ambulatory Visit (INDEPENDENT_AMBULATORY_CARE_PROVIDER_SITE_OTHER): Payer: Medicare Other | Admitting: Gynecology

## 2016-11-04 VITALS — BP 118/76 | Ht 62.0 in | Wt 168.0 lb

## 2016-11-04 DIAGNOSIS — N898 Other specified noninflammatory disorders of vagina: Secondary | ICD-10-CM

## 2016-11-04 DIAGNOSIS — N952 Postmenopausal atrophic vaginitis: Secondary | ICD-10-CM | POA: Diagnosis not present

## 2016-11-04 DIAGNOSIS — N951 Menopausal and female climacteric states: Secondary | ICD-10-CM

## 2016-11-04 DIAGNOSIS — Z01411 Encounter for gynecological examination (general) (routine) with abnormal findings: Secondary | ICD-10-CM | POA: Diagnosis not present

## 2016-11-04 DIAGNOSIS — Z124 Encounter for screening for malignant neoplasm of cervix: Secondary | ICD-10-CM

## 2016-11-04 NOTE — Patient Instructions (Signed)
Try over-the-counter vaginal moisturizers to see if it doesn't help with the vaginal dryness. One product is called Replens but there are others.

## 2016-11-04 NOTE — Progress Notes (Signed)
    Jessica Gibson Apr 13, 1946 [id number]        71 y.o.  G2P2 for breast and pelvic exam.   Past medical history,surgical history, problem list, medications, allergies, family history and social history were all reviewed and documented as reviewed in the EPIC chart.  ROS:  Performed with pertinent positives and negatives included in the history, assessment and plan.   Additional significant findings :  None   Exam: [name] assistant Vitals:   11/04/16 1123  BP: 118/76  Weight: 168 lb (76.2 kg)  Height: 5\' 2"  (1.575 m)   Body mass index is 30.73 kg/m.  General appearance:  Normal affect, orientation and appearance. Skin: Grossly normal HEENT: Without gross lesions.  No cervical or supraclavicular adenopathy. Thyroid normal.  Lungs:  Clear without wheezing, rales or rhonchi Cardiac: RR, without RMG Abdominal:  Soft, nontender, without masses, guarding, rebound, organomegaly or hernia Breasts:  Examined lying and sitting without masses, retractions, discharge or axillary adenopathy. Pelvic:  Ext, BUS, Vagina: With atrophic changes.  Small submucosal nodule 2 fingerbreadths within the introitus along posterior vaginal wall unchanged from prior exams.  Cervix:  With atrophic changes. Pap smear done  Uterus:  Anteverted, normal size, shape and contour, midline and mobile nontender   Adnexa: Without masses or tenderness    Anus and perineum: Normal   Rectovaginal: Normal sphincter tone without palpated masses or tenderness.    Assessment/Plan:  71 y.o. G2P2 female for breast and pelvic exam.   1. Postmenopausal/atrophic genital changes. Patient has noted significant issues with vaginal dryness on a daily basis. Is not sexually active. Had been using Premarin vaginal cream a number of years ago by Dr. Cherylann Banas but has not had this for a long time. I reviewed options for the atrophic vaginitis with symptomatic dryness to include OTC products such as Replens or other moisturizers,  vaginal estrogen creams such as Premarin, Estrace or generic estradiol, Vagifem, Osphena, [employer]   laser. Patient is not interested in any significant intervention but will try the OTC vaginal moisturizer products. She'll follow up if this continues to be a significant issue and she wants to rediscuss treatment options. 2. Small posterior vaginal wall nodule present for years unchanged. Continue monitor with annual exams. 3. Pap smear 2015. Pap smear done today. History of LEEP 11 years ago for LGSIL. Normal Pap smears since. Options to stop screening altogether the current screening guidelines reviewed. Will readdress on annual basis. 4. Mammography 08/2016. Continue with annual mammography when due. SBE monthly reviewed. 5. Colonoscopy 2014. Repeat at their recommended interval. 6. DEXA 2013 normal. Recommended she discuss with Dr. Sharlett Iles repeating it this year at a 5 year interval and she is going to do this when she sees him. 7. Health maintenance. No routine lab work done as patient does this at her primary physician's office. Follow up 1 year, sooner if vaginal dryness continues to be a major issue.   Additional time in excess of her breast and pelvic exam was spent in direct face to face counseling and coordination of care in regards to her symptomatic atrophic vaginitis with treatment options reviewed.    Anastasio Auerbach MD, 11:47 AM 11/04/2016

## 2016-11-04 NOTE — Addendum Note (Signed)
Addended by: Nelva Nay on: 11/04/2016 12:24 PM   Modules accepted: Orders

## 2016-11-05 LAB — PAP IG (IMAGE GUIDED)

## 2016-11-09 DIAGNOSIS — H5203 Hypermetropia, bilateral: Secondary | ICD-10-CM | POA: Diagnosis not present

## 2016-11-09 DIAGNOSIS — H40053 Ocular hypertension, bilateral: Secondary | ICD-10-CM | POA: Diagnosis not present

## 2016-11-09 DIAGNOSIS — H2513 Age-related nuclear cataract, bilateral: Secondary | ICD-10-CM | POA: Diagnosis not present

## 2017-02-25 DIAGNOSIS — E784 Other hyperlipidemia: Secondary | ICD-10-CM | POA: Diagnosis not present

## 2017-02-25 DIAGNOSIS — I10 Essential (primary) hypertension: Secondary | ICD-10-CM | POA: Diagnosis not present

## 2017-02-25 DIAGNOSIS — N390 Urinary tract infection, site not specified: Secondary | ICD-10-CM | POA: Diagnosis not present

## 2017-02-25 DIAGNOSIS — R8299 Other abnormal findings in urine: Secondary | ICD-10-CM | POA: Diagnosis not present

## 2017-03-04 DIAGNOSIS — D485 Neoplasm of uncertain behavior of skin: Secondary | ICD-10-CM | POA: Diagnosis not present

## 2017-03-04 DIAGNOSIS — Z6830 Body mass index (BMI) 30.0-30.9, adult: Secondary | ICD-10-CM | POA: Diagnosis not present

## 2017-03-04 DIAGNOSIS — G4733 Obstructive sleep apnea (adult) (pediatric): Secondary | ICD-10-CM | POA: Diagnosis not present

## 2017-03-04 DIAGNOSIS — D2271 Melanocytic nevi of right lower limb, including hip: Secondary | ICD-10-CM | POA: Diagnosis not present

## 2017-03-04 DIAGNOSIS — E784 Other hyperlipidemia: Secondary | ICD-10-CM | POA: Diagnosis not present

## 2017-03-04 DIAGNOSIS — L814 Other melanin hyperpigmentation: Secondary | ICD-10-CM | POA: Diagnosis not present

## 2017-03-04 DIAGNOSIS — Z85828 Personal history of other malignant neoplasm of skin: Secondary | ICD-10-CM | POA: Diagnosis not present

## 2017-03-04 DIAGNOSIS — I10 Essential (primary) hypertension: Secondary | ICD-10-CM | POA: Diagnosis not present

## 2017-03-04 DIAGNOSIS — D1801 Hemangioma of skin and subcutaneous tissue: Secondary | ICD-10-CM | POA: Diagnosis not present

## 2017-03-04 DIAGNOSIS — L821 Other seborrheic keratosis: Secondary | ICD-10-CM | POA: Diagnosis not present

## 2017-03-04 DIAGNOSIS — Z23 Encounter for immunization: Secondary | ICD-10-CM | POA: Diagnosis not present

## 2017-03-04 DIAGNOSIS — Z1389 Encounter for screening for other disorder: Secondary | ICD-10-CM | POA: Diagnosis not present

## 2017-03-04 DIAGNOSIS — Z0000 Encounter for general adult medical examination without abnormal findings: Secondary | ICD-10-CM | POA: Diagnosis not present

## 2017-03-04 DIAGNOSIS — D225 Melanocytic nevi of trunk: Secondary | ICD-10-CM | POA: Diagnosis not present

## 2017-03-04 DIAGNOSIS — D1800 Hemangioma unspecified site: Secondary | ICD-10-CM | POA: Diagnosis not present

## 2017-03-04 DIAGNOSIS — L820 Inflamed seborrheic keratosis: Secondary | ICD-10-CM | POA: Diagnosis not present

## 2017-03-04 DIAGNOSIS — R202 Paresthesia of skin: Secondary | ICD-10-CM | POA: Diagnosis not present

## 2017-03-04 DIAGNOSIS — M545 Low back pain: Secondary | ICD-10-CM | POA: Diagnosis not present

## 2017-03-29 DIAGNOSIS — H43812 Vitreous degeneration, left eye: Secondary | ICD-10-CM | POA: Diagnosis not present

## 2017-04-20 ENCOUNTER — Encounter: Payer: Self-pay | Admitting: Neurology

## 2017-04-21 ENCOUNTER — Encounter: Payer: Self-pay | Admitting: Neurology

## 2017-04-21 ENCOUNTER — Ambulatory Visit (INDEPENDENT_AMBULATORY_CARE_PROVIDER_SITE_OTHER): Payer: Medicare Other | Admitting: Neurology

## 2017-04-21 VITALS — BP 170/82 | HR 59 | Ht 62.0 in | Wt 173.0 lb

## 2017-04-21 DIAGNOSIS — G4730 Sleep apnea, unspecified: Secondary | ICD-10-CM | POA: Diagnosis not present

## 2017-04-21 DIAGNOSIS — E669 Obesity, unspecified: Secondary | ICD-10-CM | POA: Diagnosis not present

## 2017-04-21 DIAGNOSIS — G478 Other sleep disorders: Secondary | ICD-10-CM | POA: Diagnosis not present

## 2017-04-21 DIAGNOSIS — I150 Renovascular hypertension: Secondary | ICD-10-CM

## 2017-04-21 DIAGNOSIS — J300 Vasomotor rhinitis: Secondary | ICD-10-CM | POA: Diagnosis not present

## 2017-04-21 DIAGNOSIS — G4700 Insomnia, unspecified: Secondary | ICD-10-CM

## 2017-04-21 DIAGNOSIS — R0683 Snoring: Secondary | ICD-10-CM

## 2017-04-21 NOTE — Patient Instructions (Signed)

## 2017-04-21 NOTE — Progress Notes (Signed)
SLEEP MEDICINE CLINIC   Provider:  Larey Seat, M D  Primary Care Physician:  Leanna Battles, MD   Referring Provider: Leanna Battles, MD      HPI:  Jessica Gibson is a 71 y.o. female , seen here as in a referral  from Dr. Philip Aspen for a new evaluation of possible obstructive sleep apnea based on serially elevated blood pressure readings. She has rhinitis, sleeps on her side and has been claustrophobic - this interferes with CPAP use.   Years ago, she underwent a PSG study ( October 21st  2015), was diagnosed with mild obstructive sleep apnea and an AHI of 12.9/ hr. , but an RDI of 24.2/hr.  indicated that she was snoring loudly.   During REM sleep her AHI exacerbated to 29/h of supine sleep to 28.5.   Oxygen nadir was 86% SPO2 was 16.2 minutes of desaturation time, there was no CO2 retention noted, no periodic limb movements were seen.   Her heart rate did not vary greatly during the sleep study.    I felt that the polysomnography revealed obstructive sleep apnea and upper airway resistance sleep, and that CPAP titration was not option but not an oral appliance or an ENT evaluation for procedure were also treatment possibilities and ,in addition, weight loss.  Jessica Gibson also reports that she has struggled with elevated blood pressure readings since age 64, she has gained weight.  This may have caused a higher degree of sleep apnea. BP readings a have been all over the map-  Up to 664 mmHg systolic.   Sleep habits are as follows:  She lives alone.  The patient's intended bedtime is usually 11 PM but once in bed sleeping on her side she has been woken by a congested nasal passage. She has been prescribed a nasal spray by Dr. Wilburn Cornelia that helps her to keep the nasal passage open for exactly 6 hours, after which time she will wake up congested again difficult is to return to sleep without using again medication.  She also has benign positional vertigo arising from the right ear for the  limits her choice of sleep positions.  She sleeps on a flat mattress, 2 pillows for head support.  She wakes up after 6 hours of sleep usually with the urge to go and urinate as well as with a congested nose.  Her sleep is otherwise not very fragmented. She estimates that she gets nocturnally 6 hours of good sleep, she rises in the morning at 7 AM, wakes up spontaneously. He does have arthritic pain joint pain, but there is no headache, dizziness in the morning, nausea, diaphoresis or palpitation.  Does wake up with a very dry mouth indicating that she may still be snoring.   Sleep medical history and family sleep history: I reviewed Jessica Gibson on previous sleep study results, I also reviewed her medication list, the patient has a history of untreated obstructive sleep apnea, hypertension currently on 5  medications including minoxidil, Norvasc, metoprolol and Hyzaar.  Hyzaar is a combination product of losartan and hydrochlorothiazide. She still has elevated blood pressures in the 403K systolic.  She also has hyperlipidemia but cannot tolerate statins, she has lower back pain and joint pain.  Renovascular hypertension?    Social history:  Widowed with 2 sons , 57 and 55. 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  She loves to read, she lives alone, she drinks 1 caffeinated beverages per day, does not use alcohol and does not use  tobacco products.  She exercises by bike or walks she also loves water aerobics.   Review of Systems: Out of a complete 14 system review, the patient complains of only the following symptoms, and all other reviewed systems are negative. Snoring. Claustrophobia, congested nasal passage   Epworth score  4 , Fatigue severity score 18  , depression score 1/1 5    Social History   Socioeconomic History  . Marital status: Widowed    Spouse name: Not on file  . Number of children: 2  . Years of education: College  . Highest education level: Not on file  Social Needs  . Financial  resource strain: Not on file  . Food insecurity - worry: Not on file  . Food insecurity - inability: Not on file  . Transportation needs - medical: Not on file  . Transportation needs - non-medical: Not on file  Occupational History    Employer: SELF EMPLOYED  Tobacco Use  . Smoking status: Never Smoker  . Smokeless tobacco: Never Used  Substance and Sexual Activity  . Alcohol use: Yes    Alcohol/week: 0.0 oz    Comment: seldom  . Drug use: No  . Sexual activity: No    Birth control/protection: Post-menopausal    Comment: 1st intercourse 71 yo-Fewer than 5 partners  Other Topics Concern  . Not on file  Social History Narrative   Patient is widowed and lives alone.   Patient has two adult children.   Patient is self-employed, semi-retired.   Patient is right-handed.   Patient drinks three cups of coffee daily.   Patient has some college education.    Family History  Problem Relation Age of Onset  . Diabetes Mother   . Hypertension Mother   . Heart disease Mother   . Heart disease Father   . Other Father        circulation problems  . Colon cancer Neg Hx     Past Medical History:  Diagnosis Date  . Arthritis    KNEE  . Atrophic vaginitis   . CIN I (cervical intraepithelial neoplasia I) 03/2004   LEEP  . Heart murmur   . Hypercholesteremia   . Hypertension   . OSA (obstructive sleep apnea)   . Other malaise and fatigue 02/28/2014  . Post-operative nausea and vomiting   . Snoring 02/28/2014    Past Surgical History:  Procedure Laterality Date  . ARTHROSCOPY OF KNEE    . CERVICAL BIOPSY  W/ LOOP ELECTRODE EXCISION  2005  . JOINT REPLACEMENT Right    knee  . 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 amLODipine (NORVASC) 5 MG tablet Take 5 mg by mouth daily.      . ergocalciferol (VITAMIN D2) 50000 UNITS capsule Take 50,000 Units by mouth every 30 (thirty) days.    . famotidine (PEPCID) 10 MG  tablet Take 10 mg daily as needed by mouth for heartburn or indigestion.    . fluticasone (FLONASE) 50 MCG/ACT nasal spray SHAKE LIQUID AND USE 2 SPRAYS IN EACH NOSTRIL DAILY    . ibuprofen (ADVIL,MOTRIN) 200 MG tablet Take 400 mg by mouth every 6 (six) hours as needed for moderate pain (pain).    Marland Kitchen losartan-hydrochlorothiazide (HYZAAR) 100-12.5 MG per tablet Take 0.5 tablets by mouth 2 (two) times daily.     . meclizine (ANTIVERT) 25 MG tablet Take 1 tablet (25 mg total) by mouth 2 (  two) times daily as needed for dizziness or nausea. 30 tablet 0  . metoprolol (LOPRESSOR) 50 MG tablet Take 50 mg by mouth 2 (two) times daily.      . minoxidil (LONITEN) 2.5 MG tablet Take 2.5 mg by mouth daily.     No current facility-administered medications for this visit.     Allergies as of 04/21/2017  . (No Known Allergies)    Vitals: BP (!) 170/82 Comment: manually  Pulse (!) 59   Ht 5\' 2"  (1.575 m)   Wt 173 lb (78.5 kg)   BMI 31.64 kg/m  Last Weight:  Wt Readings from Last 1 Encounters:  04/21/17 173 lb (78.5 kg)   YKD:XIPJ mass index is 31.64 kg/m.     Last Height:   Ht Readings from Last 1 Encounters:  04/21/17 5\' 2"  (1.575 m)    Physical exam:  General: The patient is awake, alert and appears not in acute distress. The patient is well groomed. Head: Normocephalic, atraumatic. Neck is supple. Mallampati  4 neck circumference: 16. Nasal airflow congested, Retrognathia is seen.  Cardiovascular:  Regular rate and rhythm , without  murmurs or carotid bruit, and without distended neck veins. Respiratory: Lungs are clear to auscultation. Skin:  Without evidence of edema, or rash Trunk: BMI is 32 . The patient's posture is erect  Neurologic exam : The patient is awake and alert, oriented to place and time.   Attention span & concentration ability appears normal.  Speech is fluent,  without dysarthria, dysphonia or aphasia.  Mood and affect are appropriate.  Cranial nerves: Pupils are  equal and briskly reactive to light.  Funduscopic exam without evidence of pallor or edema.  Extraocular movements  in vertical and horizontal planes intact and without nystagmus. Visual fields by finger perimetry are intact. Hearing to finger rub intact.  Facial sensation intact to fine touch. Facial motor strength is symmetric and tongue and uvula move midline. Shoulder shrug was symmetrical.   Motor exam: Normal tone, muscle bulk and symmetric strength in all extremities. Sensory:  Fine touch, pinprick and vibration were tested in all extremities. Proprioception tested in the upper extremities was normal. Coordination: Rapid alternating movements in the fingers/hands was normal. Finger-to-nose maneuver  normal without evidence of ataxia, dysmetria or tremor. Gait and station: Patient walks without assistive device and is able unassisted to climb up to the exam table. Strength within normal limits.  Stance is stable and normal.Tandem gait is unfragmented. Turns with 3 Steps. Deep tendon reflexes: in the upper and lower extremities are symmetric and intact. Babinski maneuver response is downgoing.   Assessment:  After physical and neurologic examination, review of laboratory studies,  Personal review of imaging studies, reports of other /same  Imaging studies, results of polysomnography and / or neurophysiology testing and pre-existing records as far as provided in visit., my assessment is .   1) I would like to reevaluate Jessica Gibson. Gibson for the current degree and  type of sleep apnea she may have now,  and I would like to have these studies take place under the influence of her nasal spray she may have a much lower apnea index now that she can breathe through the nose than she used to have in spite of some weight gain since last seen.  He gained about 18 pounds. Once we know what degree of apnea she has I would be happy to discuss again treatment options with her it seems that one way to help  her is to keep her sleeping on her side and not on her back.  She also should pursue weight loss by diet and exercise. She has excellent sleep hygiene, she does not drink she does not smoke and she is a very moderate caffeine drinker.   The patient was advised of the nature of the diagnosed disorder , the treatment options and the  risks for general health and wellness arising from not treating the condition.   I spent more than 45 minutes of face to face time with the patient. Greater than 50% of time was spent in counseling and coordination of care. We have discussed the diagnosis and differential and I answered the patient's questions.    Plan:  Treatment plan and additional workup : I will be happy to reevaluate Jessica Gibson in a sleep study, Medicaid usually expects me to do an in lab assisted attended sleep study.  For her with her.  If her AHI is over 20 will split. In review of her medication list and her very high systolic blood pressures I have wondered if untreated sleep apnea is the most likely explanation, I would like her to be evaluated for  renal artery stenosis.  Rv with me .   Larey Seat, MD 29/92/4268, 3:41 PM  Certified in Neurology by ABPN Certified in [place] by [facility] [address] [place], [place] [address]

## 2017-04-28 DIAGNOSIS — H43812 Vitreous degeneration, left eye: Secondary | ICD-10-CM | POA: Diagnosis not present

## 2017-06-10 DIAGNOSIS — Z6831 Body mass index (BMI) 31.0-31.9, adult: Secondary | ICD-10-CM | POA: Diagnosis not present

## 2017-06-10 DIAGNOSIS — I10 Essential (primary) hypertension: Secondary | ICD-10-CM | POA: Diagnosis not present

## 2017-06-10 DIAGNOSIS — M545 Low back pain: Secondary | ICD-10-CM | POA: Diagnosis not present

## 2017-06-10 DIAGNOSIS — G4733 Obstructive sleep apnea (adult) (pediatric): Secondary | ICD-10-CM | POA: Diagnosis not present

## 2017-06-14 ENCOUNTER — Other Ambulatory Visit: Payer: Self-pay | Admitting: Internal Medicine

## 2017-06-14 DIAGNOSIS — I10 Essential (primary) hypertension: Secondary | ICD-10-CM

## 2017-06-21 ENCOUNTER — Other Ambulatory Visit: Payer: Self-pay

## 2017-06-23 ENCOUNTER — Ambulatory Visit
Admission: RE | Admit: 2017-06-23 | Discharge: 2017-06-23 | Disposition: A | Discharge status: Home / Self Care | Payer: Medicare Other | Source: Ambulatory Visit | Attending: Internal Medicine | Admitting: Internal Medicine

## 2017-06-23 DIAGNOSIS — I10 Essential (primary) hypertension: Secondary | ICD-10-CM

## 2017-06-23 DIAGNOSIS — N281 Cyst of kidney, acquired: Secondary | ICD-10-CM | POA: Diagnosis not present

## 2017-06-28 ENCOUNTER — Telehealth: Payer: Self-pay | Admitting: *Deleted

## 2017-06-28 DIAGNOSIS — N840 Polyp of corpus uteri: Secondary | ICD-10-CM

## 2017-06-28 NOTE — Telephone Encounter (Signed)
-----   Message from Anastasio Auerbach, MD sent at 06/25/2017  3:56 PM EST ----- Tell patient her renal ultrasound showed the lining to her uterus to be a little thicker suggesting the possibility of a polyp.  Recommend patient schedule sonohysterogram to let us relook at the uterus with ultrasound and possible biopsy if it does look like there could be a polyp.

## 2017-06-28 NOTE — Telephone Encounter (Signed)
Pt informed, order placed, transferred to front desk to schedule.

## 2017-07-09 ENCOUNTER — Ambulatory Visit (INDEPENDENT_AMBULATORY_CARE_PROVIDER_SITE_OTHER): Payer: Medicare Other | Admitting: Neurology

## 2017-07-09 DIAGNOSIS — G4700 Insomnia, unspecified: Secondary | ICD-10-CM

## 2017-07-09 DIAGNOSIS — G4733 Obstructive sleep apnea (adult) (pediatric): Secondary | ICD-10-CM | POA: Diagnosis not present

## 2017-07-09 DIAGNOSIS — J300 Vasomotor rhinitis: Secondary | ICD-10-CM

## 2017-07-09 DIAGNOSIS — R0683 Snoring: Secondary | ICD-10-CM

## 2017-07-09 DIAGNOSIS — G4730 Sleep apnea, unspecified: Secondary | ICD-10-CM

## 2017-07-09 DIAGNOSIS — G478 Other sleep disorders: Secondary | ICD-10-CM

## 2017-07-09 DIAGNOSIS — I150 Renovascular hypertension: Secondary | ICD-10-CM

## 2017-07-09 DIAGNOSIS — E669 Obesity, unspecified: Secondary | ICD-10-CM

## 2017-07-15 ENCOUNTER — Other Ambulatory Visit: Payer: Self-pay | Admitting: Gynecology

## 2017-07-15 DIAGNOSIS — Z780 Asymptomatic menopausal state: Secondary | ICD-10-CM

## 2017-07-15 DIAGNOSIS — R9389 Abnormal findings on diagnostic imaging of other specified body structures: Secondary | ICD-10-CM

## 2017-07-16 ENCOUNTER — Other Ambulatory Visit: Payer: Self-pay | Admitting: Neurology

## 2017-07-16 DIAGNOSIS — R0683 Snoring: Secondary | ICD-10-CM

## 2017-07-16 DIAGNOSIS — G4700 Insomnia, unspecified: Secondary | ICD-10-CM

## 2017-07-16 DIAGNOSIS — G4730 Sleep apnea, unspecified: Principal | ICD-10-CM

## 2017-07-16 NOTE — Procedures (Signed)
PATIENT'S NAME:  Jessica Gibson, Jessica Gibson DOB:      1946/03/08      MRN#:    [id number]     DATE OF RECORDING: 07/09/2017 REFERRING M.D.:  Leanna Battles, M.D. Study Performed:   Baseline Polysomnogram HISTORY: Jessica Gibson is seen here for a new evaluation of possible obstructive sleep apnea based on serially elevated blood pressure readings. She has rhinitis, sleeps on her side and has been claustrophobic - this interferes with CPAP use. In a previous PSG study (October 21st 2015), was diagnosed with mild obstructive sleep apnea at an AHI of 12.9/ hr., but had a high RDI of 24.2/hr., this indicated that she was snoring loudly and has upper airway resistance. During REM sleep her AHI exacerbated to 29/hr. AHI in supine sleep was 28.5/hr.  Oxygen nadir was 86% no prolonged desaturation time, no CO2 retention noted, and no periodic limb movements were seen. The patient endorsed the Epworth Sleepiness Scale at 4/24 points.   The patient's weight 173 pounds with a height of 62 (inches), resulting in a BMI of 31.6 kg/m2. The patient's neck circumference measured 16 inches.  CURRENT MEDICATIONS: Norvasc, Vit. D2, Pepcid, Flonase, Advil, Hyzaar, Antivert, Lopressor, Minoxidil.   PROCEDURE:  This is a multichannel digital polysomnogram utilizing the Somnostar 11.2 system.  Electrodes and sensors were applied and monitored per AASM Specifications.   EEG, EOG, Chin and Limb EMG, were sampled at 200 Hz.  ECG, Snore and Nasal Pressure, Thermal Airflow, Respiratory Effort, CPAP Flow and Pressure, Oximetry was sampled at 50 Hz. Digital video and audio were recorded.      BASELINE STUDY: Lights Out was at 22:11 and Lights On at 04:59.  Total recording time (TRT) was 409 minutes, with a total sleep time (TST) of 169 minutes. The patient's sleep latency was 91 minutes. REM latency was 346.5 minutes. The sleep efficiency was 41.3 %.     SLEEP ARCHITECTURE: WASO (Wake after sleep onset) was 183.5 minutes.  There were 16.5  minutes in Stage N1, 78.5 minutes Stage N2, 69.5 minutes Stage N3 and 4.5 minutes in Stage REM.  The percentage of Stage N1 was 9.8%, Stage N2 was 46.4%, Stage N3 was 41.1% and Stage R (REM sleep) was only 2.7%.   RESPIRATORY ANALYSIS:  There were a total of 64 respiratory events:  25 obstructive apneas, 8 central apneas and 4 mixed apneas with a total of 37 apneas and an apnea index (AI) of 13.1 /hour. There were 27 hypopneas with a hypopnea index of 9.6 /hour. The patient also had 0 respiratory event related arousals (RERAs).     The total APNEA/HYPOPNEA INDEX (AHI) was 22.7/hour and the total RESPIRATORY DISTURBANCE INDEX was 22.7 /hour.  3 events occurred in REM sleep, 60 events in NREM. The REM AHI was 40/hour, versus a non-REM AHI of 22.2/hr. The patient spent 13 minutes of total sleep time in the supine position and 156 minutes in non-supine. The supine AHI was 69.2/hr. versus a non-supine AHI of 18.8.  OXYGEN SATURATION & C02:  The Wake baseline 02 saturation was 95%, with the lowest being 84%. Time spent below 89% saturation equaled 49 minutes.    PERIODIC LIMB MOVEMENTS:  The patient had a total of 5 Periodic Limb Movements.  The Periodic Limb Movement (PLM) index was 1.8 and the PLM Arousal index was 0.7/hour. The arousals were noted as: 52 were spontaneous, 2 were associated with PLMs, and 44 were associated with respiratory events. Audio and video analysis did not  show any abnormal or unusual movements, behaviors, phonations or vocalizations.  The patient took bathroom breaks. Snoring was noted. EKG was in keeping with normal sinus rhythm (NSR). Post-study, the patient indicated that sleep was the same as usual.   IMPRESSION:  1. Complex, but dominantly Obstructive Sleep Apnea (OSA) at AHI 22.7/hr. with accentuation in REM and supine sleep.  2. Low sleep efficiency and prolonged latency to sleep prevented implementing the SPLIT protocol.  3. Most arousals were spontaneous.    RECOMMENDATIONS:  1. Advise full-night, attended, CPAP titration study to optimize therapy.   2. Avoid sedative-hypnotics and alcohol which may worsen sleep apnea (as applicable). 3. Advise to lose weight by diet and exercise if not contraindicated (BMI 31.6). 4. Further information regarding OSA may be obtained from [employer] (www.sleepfoundation.org) or American Sleep Apnea Association (www.sleepapnea.org). 5. Consider dedicated sleep psychology referral for insomnia if not improved under CPAP.   6. A follow up appointment will be scheduled in the Sleep Clinic at [facility]. The referring provider will be notified of the results.      I certify that I have reviewed the entire raw data recording prior to the issuance of this report in accordance with the Standards of Accreditation of the American Academy of Sleep Medicine (AASM)     Larey Seat, MD     07-16-2017  Diplomat, American Board of Psychiatry and Neurology  Diplomat, American Board of [employer] Director, [employer] [facility] [employer]

## 2017-07-19 ENCOUNTER — Telehealth: Payer: Self-pay | Admitting: Neurology

## 2017-07-19 ENCOUNTER — Other Ambulatory Visit: Payer: Self-pay | Admitting: Gynecology

## 2017-07-19 DIAGNOSIS — D4959 Neoplasm of unspecified behavior of other genitourinary organ: Secondary | ICD-10-CM

## 2017-07-19 DIAGNOSIS — R9389 Abnormal findings on diagnostic imaging of other specified body structures: Secondary | ICD-10-CM

## 2017-07-19 DIAGNOSIS — Z780 Asymptomatic menopausal state: Secondary | ICD-10-CM

## 2017-07-19 NOTE — Telephone Encounter (Signed)
-----   Message from Larey Seat, MD sent at 07/16/2017  1:52 PM EST ----- In comparison to previous PSG  this patient's AHI ( apnea hypopnea index) has doubled.  I ordered a return for attended PAP sleep study- this patient has complex apnea and may need change in modalities to improve AHI ( from CPAP to BiPAP, for ex.) Gentle titration.

## 2017-07-19 NOTE — Telephone Encounter (Signed)
I called to go over the sleep study results with the patient. I went in detail over the results with the patient reviewing everything that was seen on the test. I informed her Dr Dohmeier recommends the pt come in for a CPAP titration. The patient states, " I cant wear a CPAP, I am claustrophobic." I informed her that mask have some a long way and there are multiple options that she could choose from. The good thing about having this test if that it gives them the option to really work with her on the mask that may be comforting for her and her to try it and use if over night. The patient states that she has had 2 sleep studies before and she dreads this and the thought of doing this. I informed her the sleep lab would contact her to get her set up and that this would give her time to think about it. Pt verbalized understanding.

## 2017-07-20 ENCOUNTER — Telehealth: Payer: Self-pay | Admitting: Neurology

## 2017-07-20 NOTE — Telephone Encounter (Signed)
Called pt to schedule her for a cpap titration, pt is very hesitant to do so and stated that she does not understand everything. She asked for the nurse to call her.

## 2017-07-20 NOTE — Telephone Encounter (Signed)
Patient still remains very hesitant. I further then educated on what obstructive sleep apnea and central apnea is. I explained the importance of the cpap titration test and how it is necessary to know how to treat her. The patient is still very reluctant but informed me to let the sleep lab know so they can get her scheduled.

## 2017-07-29 ENCOUNTER — Ambulatory Visit (INDEPENDENT_AMBULATORY_CARE_PROVIDER_SITE_OTHER): Payer: Medicare Other

## 2017-07-29 ENCOUNTER — Encounter: Payer: Self-pay | Admitting: Gynecology

## 2017-07-29 ENCOUNTER — Ambulatory Visit (INDEPENDENT_AMBULATORY_CARE_PROVIDER_SITE_OTHER): Payer: Medicare Other | Admitting: Gynecology

## 2017-07-29 VITALS — BP 120/76

## 2017-07-29 DIAGNOSIS — Z780 Asymptomatic menopausal state: Secondary | ICD-10-CM | POA: Diagnosis not present

## 2017-07-29 DIAGNOSIS — N858 Other specified noninflammatory disorders of uterus: Secondary | ICD-10-CM | POA: Diagnosis not present

## 2017-07-29 DIAGNOSIS — N9489 Other specified conditions associated with female genital organs and menstrual cycle: Secondary | ICD-10-CM

## 2017-07-29 DIAGNOSIS — N840 Polyp of corpus uteri: Secondary | ICD-10-CM

## 2017-07-29 DIAGNOSIS — R9389 Abnormal findings on diagnostic imaging of other specified body structures: Secondary | ICD-10-CM | POA: Diagnosis not present

## 2017-07-29 NOTE — Patient Instructions (Signed)
Office will call to schedule surgery

## 2017-07-29 NOTE — Progress Notes (Signed)
    Jessica Gibson [birth year] [id number]        72 y.o.  G2P2 presents for sonohysterogram.  The patient had a renal ultrasound where they incidentally noted that her endometrial lining looked thicker measuring 1.1 cm.  For sonohysterogram.  She has done no bleeding and having no pelvic discomfort.  Past medical history,surgical history, problem list, medications, allergies, family history and social history were all reviewed and documented in the EPIC chart.  Directed ROS with pertinent positives and negatives documented in the history of present illness/assessment and plan.  Exam: [place] assistant BP 120/76 General appearance:  Normal Abdomen soft nontender without masses guarding rebound.  Pelvic external BUS vagina with atrophic changes.  Cervix with atrophic changes.  Uterus normal size midline mobile nontender.  Adnexa without masses or tenderness.  Ultrasound transvaginal shows uterus normal size and echotexture.  Endometrial echo 14.9 mm with cystic/solid pattern negative color flow Doppler.  Right ovary not visualized.  Left ovary small atrophic and normal in appearance.  Cul-de-sac negative.  Sonohysterogram performed, sterile technique, easy catheter introduction, good distention with clear endometrial polyp noted with cystic changes.  Endometrial sample taken with scant return.  The patient tolerated well.  Assessment/Plan:  72 y.o. G2P2 with incidental finding of thickened endometrial echo.  Sonohysterogram confirms endometrial polyp with cystic changes.  Endometrial biopsy was done but scant return and I suspect a little tissue obtained.  Reviewed situation with the patient in my recommendation to proceed with hysteroscopy D&C with resection of the endometrial polyp.  Differential reviewed to include benign endometrial polyp, hyperplastic with and without atypia and endometrial carcinoma.  I reviewed which involved with the procedure, the expected intraoperative and postoperative  courses.  Risks to include infection, hemorrhage necessitating transfusion and the risk of transfusion, damage to internal structures such as vagina cervix uterus perforation with damage to internal organs including bowel bladder ureters vessels and nerves necessitating major exploratory reparative surgeries and future reparative surgeries including ostomy formation bowel resection bladder repair was all discussed with her.  Patient will move towards scheduling her surgery and follow-up for preoperative visit before hand.    Anastasio Auerbach MD, 3:13 PM 07/29/2017

## 2017-08-02 ENCOUNTER — Telehealth: Payer: Self-pay

## 2017-08-02 NOTE — Telephone Encounter (Signed)
I called patient and spoke with her about scheduling surgery.  We discussed dates and she wants to consider the available dates and call me back to schedule. I will wait to hear from her.

## 2017-08-03 ENCOUNTER — Telehealth: Payer: Self-pay

## 2017-08-03 MED ORDER — MISOPROSTOL 200 MCG PO TABS: ORAL_TABLET | ORAL | 0 refills | Status: DC

## 2017-08-03 NOTE — Telephone Encounter (Signed)
Patient called today to schedule surgery for 08/25/17 after considering her schedule. I scheduled her to follow at around 10:15am on this morning at [facility].  I discussed with her the need for pre op appt with TF a week or so before and also the need for Cytotec tab vaginally hs before surgery. I did mention to her that it can cause menstrual like cramping/bleeding as it works overnight and not to let it worry her just means it is working.   Rx sent.  Rosemarie Ax will call her back to schedule pre op appt. w TF.

## 2017-08-11 ENCOUNTER — Encounter (HOSPITAL_BASED_OUTPATIENT_CLINIC_OR_DEPARTMENT_OTHER): Payer: Self-pay | Admitting: Emergency Medicine

## 2017-08-11 ENCOUNTER — Other Ambulatory Visit: Payer: Self-pay

## 2017-08-11 NOTE — Progress Notes (Signed)
SPOKE WITH: patient  RIDING HOME WITH: friend Hoyle Sauer Self [id number] AM MEDICATIONS: amlodipine, metoprolol, minoxidil  NPO STATUS: npo after midnight , sips of water with AM meds LABS: appt for labs 08-12-17 , collect cbc,bmp, EKG COMMENTS/CONCERNS: RN did a consult with anesthesia for patient recent dx of SEVERE OSA. RN made anesthesia Dr Darnell Level.Rose aware of patient pertinent medical history and meds, and noted that patient currently does not use a CPAP device although her neurologist would like to proceed with a CPAP titration to get her fitted for a device (see LOV note Dr. Maureen Chatters epic 04-21-17) and epic telephone note 07-19-17) . Per Dr Darnell Level. Rose, patient ok to proceed as normal since surgery is outpatient and patients are generally alert and oriented before being discharged.  ICE. RN, BSN.

## 2017-08-12 ENCOUNTER — Other Ambulatory Visit: Payer: Self-pay

## 2017-08-12 ENCOUNTER — Encounter (HOSPITAL_COMMUNITY)
Admission: RE | Admit: 2017-08-12 | Discharge: 2017-08-12 | Disposition: A | Discharge status: Home / Self Care | Payer: Medicare Other | Source: Ambulatory Visit | Attending: Gynecology | Admitting: Gynecology

## 2017-08-12 DIAGNOSIS — R001 Bradycardia, unspecified: Secondary | ICD-10-CM | POA: Insufficient documentation

## 2017-08-12 DIAGNOSIS — N840 Polyp of corpus uteri: Secondary | ICD-10-CM | POA: Insufficient documentation

## 2017-08-12 DIAGNOSIS — Z01810 Encounter for preprocedural cardiovascular examination: Secondary | ICD-10-CM | POA: Insufficient documentation

## 2017-08-12 LAB — CBC
HEMATOCRIT: 41.7 % (ref 36.0–46.0)
HEMOGLOBIN: 14 g/dL (ref 12.0–15.0)
MCH: 29.7 pg (ref 26.0–34.0)
MCHC: 33.6 g/dL (ref 30.0–36.0)
MCV: 88.5 fL (ref 78.0–100.0)
Platelets: 382 10*3/uL (ref 150–400)
RBC: 4.71 MIL/uL (ref 3.87–5.11)
RDW: 13.3 % (ref 11.5–15.5)
WBC: 7.5 10*3/uL (ref 4.0–10.5)

## 2017-08-12 LAB — COMPREHENSIVE METABOLIC PANEL
ALBUMIN: 4.1 g/dL (ref 3.5–5.0)
ALK PHOS: 89 U/L (ref 38–126)
ALT: 16 U/L (ref 14–54)
ANION GAP: 11 (ref 5–15)
AST: 20 U/L (ref 15–41)
BILIRUBIN TOTAL: 0.7 mg/dL (ref 0.3–1.2)
BUN: 18 mg/dL (ref 6–20)
CALCIUM: 9.9 mg/dL (ref 8.9–10.3)
CO2: 27 mmol/L (ref 22–32)
Chloride: 101 mmol/L (ref 101–111)
Creatinine, Ser: 0.86 mg/dL (ref 0.44–1.00)
GFR calc Af Amer: >60 mL/min (ref >60)
GLUCOSE: 107 mg/dL — AB (ref 65–99)
Potassium: 4.3 mmol/L (ref 3.5–5.1)
Sodium: 139 mmol/L (ref 135–145)
TOTAL PROTEIN: 7.5 g/dL (ref 6.5–8.1)

## 2017-08-16 NOTE — Pre-Procedure Instructions (Signed)
CMP results 08/12/17 faxed to Dr. Phineas Real via epic.

## 2017-08-19 ENCOUNTER — Ambulatory Visit (INDEPENDENT_AMBULATORY_CARE_PROVIDER_SITE_OTHER): Payer: Medicare Other | Admitting: Gynecology

## 2017-08-19 ENCOUNTER — Encounter: Payer: Self-pay | Admitting: Gynecology

## 2017-08-19 VITALS — BP 122/78

## 2017-08-19 DIAGNOSIS — N840 Polyp of corpus uteri: Secondary | ICD-10-CM

## 2017-08-19 NOTE — Patient Instructions (Signed)
Followup for surgery as scheduled. 

## 2017-08-19 NOTE — H&P (Signed)
Jessica Gibson 04-Dec-1945 [id number]   History and Physical  Chief complaint: Endometrial polyp  History of present illness: 72 y.o. G2P2 with history of renal ultrasound where they incidentally noted the endometrial lining looked thicker at 1.1 cm.  Patient has no history of bleeding, pain or other gynecologic symptoms.  Follow-up pelvic ultrasound showed endometrial echo 14.9 mm with a cystic solid pattern.  Sonohysterogram confirmed a large endometrial polyp with cystic changes.  Endometrial biopsy showed no endometrial tissue in the sampling.  The patient is admitted for hysteroscopy D&C with resection of the endometrial polyp.  Past Medical History:  Diagnosis Date  . Arthritis    "wrist, shoulders; its just there "  . Atrophic vaginitis   . CIN I (cervical intraepithelial neoplasia I) 03/2004   LEEP  . Heart murmur    dx in childohood; " i have a small hole in my hart but it doesnt do anything"   . History of kidney stones    "i had 1 kidney at age 41 and at that time they went up, put a basket and retrieved "   . Hx of vertigo    "3 to 4 months of it; but currently no symptoms becasue i used the epley maneuver"   . Hypercholesteremia    cant tolerate statins; just watching diet ; currently on weight watchers  . Hypertension   . Nasal turbinate hypertrophy    dx per Dr. Redmond Baseman ; uses nasal spray for relief   . OSA (obstructive sleep apnea)    initial dx in 2015 as mild; retest 07-09-17 dx with SEVERE OSA ; no current CPAP use, per Neuro Dr Maureen Chatters plan to complete CPAP titration (see 07-19-17 telephone note in epic)  . Other malaise and fatigue 02/28/2014  . Post-operative nausea and vomiting   . Snoring 02/28/2014    Past Surgical History:  Procedure Laterality Date  . ARTHROSCOPY OF KNEE Right    same practice as Dr Maureen Ralphs, [facility]   . CERVICAL BIOPSY  W/ LOOP ELECTRODE EXCISION  2005   done with Dr Deeann Cree ; [place]. same practice as Dr Phineas Real;  reports : " i guess it was benign because we didnt have to do anything"   . REPLACEMENT TOTAL KNEE Right 2009   dr Wynelle Link     Family History  Problem Relation Age of Onset  . Diabetes Mother   . Hypertension Mother   . Heart disease Mother   . Heart disease Father   . Other Father        circulation problems  . Colon cancer Neg Hx     Social History:  reports that  has never smoked. she has never used smokeless tobacco. She reports that she drinks alcohol. She reports that she does not use drugs.  Allergies: No Known Allergies  Medications: See epic for the most current list  ROS:  Was performed and pertinent positives and negatives are included in the history of present illness.  Exam:  [name] assistant Vitals:   08/19/17 1022  BP: 122/78  General appearance:  Normal HEENT normal Lungs clear Cardiac regular rate no rubs murmurs or gallops Abdomen soft nontender without masses guarding rebound Pelvic external BUS vagina with atrophic changes.  Cervix with atrophic changes.  Uterus normal size midline mobile nontender.  Adnexa without masses or tenderness.        Assessment/Plan:  72 y.o. G2P2 with large endometrial polyp found incidentally on renal ultrasound  for hysteroscopy D&C and resection of the polyp.  I reviewed the proposed surgery with the patient to include the expected intraoperative and postoperative courses as well as the recovery period. The use of the hysteroscope, resectoscope and the D&C portion were all discussed. The risks of surgery to include infection, prolonged antibiotics, hemorrhage necessitating transfusion and the risks of transfusion, including transfusion reaction, hepatitis, HIV, mad cow disease and other unknown entities were all discussed understood and accepted. The risk of damage to internal organs during the procedure, either immediately recognized or delay recognized, including vagina, cervix, uterus, possible perforation causing damage  to bowel, bladder, ureters, vessels and nerves necessitating major exploratory reparative surgery and future reparative surgeries including bladder repair, ureteral damage repair, bowel resection, ostomy formation was also discussed understood and accepted. The patient's questions were answered to her satisfaction and she is ready to proceed with surgery.   Anastasio Auerbach MD, 10:49 AM 08/19/2017

## 2017-08-19 NOTE — Progress Notes (Signed)
    Jessica Gibson [birth year] [id number]        72 y.o.  G2P2 presents for postoperative consult for upcoming hysteroscopy D&C resection of endometrial polyp.  History is outlined in the dictated history and physical that I did for the OR today.  Past medical history,surgical history, problem list, medications, allergies, family history and social history were all reviewed and documented in the EPIC chart.  Directed ROS with pertinent positives and negatives documented in the history of present illness/assessment and plan.  Exam: Jessica Gibson assistant Vitals:   08/19/17 1022  BP: 122/78   General appearance:  Normal HEENT normal Lungs clear Cardiac regular rate no rubs murmurs or gallops Abdomen soft nontender without masses guarding rebound Pelvic external BUS vagina with atrophic changes.  Cervix with atrophic changes.  Uterus normal size midline mobile nontender.  Adnexa without masses or tenderness.  Assessment/Plan:  72 y.o. G2P2 with large endometrial polyp found incidentally on renal ultrasound for hysteroscopy D&C and resection of the polyp.  I reviewed the proposed surgery with the patient to include the expected intraoperative and postoperative courses as well as the recovery period. The use of the hysteroscope, resectoscope and the D&C portion were all discussed. The risks of surgery to include infection, prolonged antibiotics, hemorrhage necessitating transfusion and the risks of transfusion, including transfusion reaction, hepatitis, HIV, mad cow disease and other unknown entities were all discussed understood and accepted. The risk of damage to internal organs during the procedure, either immediately recognized or delay recognized, including vagina, cervix, uterus, possible perforation causing damage to bowel, bladder, ureters, vessels and nerves necessitating major exploratory reparative surgery and future reparative surgeries including bladder repair, ureteral damage repair, bowel  resection, ostomy formation was also discussed understood and accepted. The patient's questions were answered to her satisfaction and she is ready to proceed with surgery.    Anastasio Auerbach MD, 10:45 AM 08/19/2017

## 2017-08-25 ENCOUNTER — Ambulatory Visit (HOSPITAL_BASED_OUTPATIENT_CLINIC_OR_DEPARTMENT_OTHER)
Admission: RE | Admit: 2017-08-25 | Discharge: 2017-08-25 | Disposition: A | Discharge status: Home / Self Care | Payer: Medicare Other | Source: Ambulatory Visit | Attending: Gynecology | Admitting: Gynecology

## 2017-08-25 ENCOUNTER — Ambulatory Visit (HOSPITAL_BASED_OUTPATIENT_CLINIC_OR_DEPARTMENT_OTHER): Payer: Medicare Other | Admitting: Anesthesiology

## 2017-08-25 ENCOUNTER — Other Ambulatory Visit: Payer: Self-pay

## 2017-08-25 ENCOUNTER — Encounter (HOSPITAL_BASED_OUTPATIENT_CLINIC_OR_DEPARTMENT_OTHER)
Admission: RE | Disposition: A | Discharge status: Home / Self Care | Payer: Self-pay | Source: Ambulatory Visit | Attending: Gynecology

## 2017-08-25 ENCOUNTER — Encounter (HOSPITAL_BASED_OUTPATIENT_CLINIC_OR_DEPARTMENT_OTHER): Payer: Self-pay | Admitting: *Deleted

## 2017-08-25 ENCOUNTER — Telehealth: Payer: Self-pay | Admitting: *Deleted

## 2017-08-25 DIAGNOSIS — E7800 Pure hypercholesterolemia, unspecified: Secondary | ICD-10-CM | POA: Insufficient documentation

## 2017-08-25 DIAGNOSIS — N858 Other specified noninflammatory disorders of uterus: Secondary | ICD-10-CM | POA: Diagnosis not present

## 2017-08-25 DIAGNOSIS — N840 Polyp of corpus uteri: Secondary | ICD-10-CM | POA: Insufficient documentation

## 2017-08-25 DIAGNOSIS — G4733 Obstructive sleep apnea (adult) (pediatric): Secondary | ICD-10-CM | POA: Diagnosis not present

## 2017-08-25 DIAGNOSIS — I10 Essential (primary) hypertension: Secondary | ICD-10-CM | POA: Insufficient documentation

## 2017-08-25 DIAGNOSIS — Z87442 Personal history of urinary calculi: Secondary | ICD-10-CM | POA: Insufficient documentation

## 2017-08-25 DIAGNOSIS — Z79899 Other long term (current) drug therapy: Secondary | ICD-10-CM | POA: Insufficient documentation

## 2017-08-25 DIAGNOSIS — J300 Vasomotor rhinitis: Secondary | ICD-10-CM | POA: Diagnosis not present

## 2017-08-25 HISTORY — DX: Personal history of other specified conditions: Z87.898

## 2017-08-25 HISTORY — PX: DILATATION & CURETTAGE/HYSTEROSCOPY WITH MYOSURE: SHX6511

## 2017-08-25 HISTORY — DX: Hypertrophy of nasal turbinates: J34.3

## 2017-08-25 HISTORY — DX: Personal history of urinary calculi: Z87.442

## 2017-08-25 SURGERY — DILATATION & CURETTAGE/HYSTEROSCOPY WITH MYOSURE
Anesthesia: General

## 2017-08-25 MED ORDER — ONDANSETRON HCL 4 MG/2ML IJ SOLN
INTRAMUSCULAR | Status: DC | PRN
Start: 2017-08-25 — End: 2017-08-25
  Administered 2017-08-25: 4 mg via INTRAVENOUS

## 2017-08-25 MED ORDER — ONDANSETRON HCL 4 MG/2ML IJ SOLN
INTRAMUSCULAR | Status: AC
  Filled 2017-08-25: qty 2

## 2017-08-25 MED ORDER — LIDOCAINE HCL 1 % IJ SOLN
INTRAMUSCULAR | Status: DC | PRN
  Administered 2017-08-25: 10 mL

## 2017-08-25 MED ORDER — PROPOFOL 10 MG/ML IV BOLUS
INTRAVENOUS | Status: DC | PRN
Start: 2017-08-25 — End: 2017-08-25
  Administered 2017-08-25: 150 mg via INTRAVENOUS

## 2017-08-25 MED ORDER — FENTANYL CITRATE (PF) 100 MCG/2ML IJ SOLN
INTRAMUSCULAR | Status: AC
  Filled 2017-08-25: qty 2

## 2017-08-25 MED ORDER — CEFOTETAN DISODIUM 2 G IJ SOLR
2.0000 g | INTRAMUSCULAR | Status: AC
  Administered 2017-08-25: 2 g via INTRAVENOUS
  Filled 2017-08-25: qty 2

## 2017-08-25 MED ORDER — LIDOCAINE 2% (20 MG/ML) 5 ML SYRINGE
INTRAMUSCULAR | Status: AC
  Filled 2017-08-25: qty 5

## 2017-08-25 MED ORDER — KETOROLAC TROMETHAMINE 30 MG/ML IJ SOLN
INTRAMUSCULAR | Status: DC | PRN
  Administered 2017-08-25: 15 mg via INTRAVENOUS

## 2017-08-25 MED ORDER — DEXAMETHASONE SODIUM PHOSPHATE 10 MG/ML IJ SOLN
INTRAMUSCULAR | Status: AC
  Filled 2017-08-25: qty 1

## 2017-08-25 MED ORDER — KETOROLAC TROMETHAMINE 30 MG/ML IJ SOLN
INTRAMUSCULAR | Status: AC
Start: 2017-08-25 — End: 2017-08-25
  Filled 2017-08-25: qty 1

## 2017-08-25 MED ORDER — OXYCODONE-ACETAMINOPHEN 5-325 MG PO TABS: 1.0000 | ORAL_TABLET | ORAL | 0 refills | Status: DC | PRN

## 2017-08-25 MED ORDER — ONDANSETRON HCL 4 MG/2ML IJ SOLN
4.0000 mg | Freq: Once | INTRAMUSCULAR | Status: DC | PRN
  Filled 2017-08-25: qty 2

## 2017-08-25 MED ORDER — OXYCODONE HCL 5 MG PO TABS
5.0000 mg | ORAL_TABLET | Freq: Once | ORAL | Status: DC | PRN
  Filled 2017-08-25: qty 1

## 2017-08-25 MED ORDER — LIDOCAINE 2% (20 MG/ML) 5 ML SYRINGE
INTRAMUSCULAR | Status: DC | PRN
  Administered 2017-08-25: 80 mg via INTRAVENOUS

## 2017-08-25 MED ORDER — OXYCODONE-ACETAMINOPHEN 2.5-325 MG PO TABS: 1.0000 | ORAL_TABLET | ORAL | 0 refills | Status: DC | PRN

## 2017-08-25 MED ORDER — FENTANYL CITRATE (PF) 100 MCG/2ML IJ SOLN
25.0000 ug | INTRAMUSCULAR | Status: DC | PRN
  Filled 2017-08-25: qty 1

## 2017-08-25 MED ORDER — LACTATED RINGERS IV SOLN
INTRAVENOUS | Status: DC
  Administered 2017-08-25: 09:00:00 via INTRAVENOUS
  Filled 2017-08-25: qty 1000

## 2017-08-25 MED ORDER — PROPOFOL 10 MG/ML IV BOLUS
INTRAVENOUS | Status: AC
  Filled 2017-08-25: qty 40

## 2017-08-25 MED ORDER — OXYCODONE-ACETAMINOPHEN 5-300 MG PO TABS: 1.0000 | ORAL_TABLET | ORAL | 0 refills | Status: DC | PRN

## 2017-08-25 MED ORDER — OXYCODONE HCL 5 MG/5ML PO SOLN
5.0000 mg | Freq: Once | ORAL | Status: DC | PRN
  Filled 2017-08-25: qty 5

## 2017-08-25 MED ORDER — CEFOTETAN DISODIUM-DEXTROSE 2-2.08 GM-%(50ML) IV SOLR
INTRAVENOUS | Status: AC
Start: 2017-08-25 — End: 2017-08-25
  Filled 2017-08-25: qty 50

## 2017-08-25 MED ORDER — FENTANYL CITRATE (PF) 100 MCG/2ML IJ SOLN
INTRAMUSCULAR | Status: DC | PRN
  Administered 2017-08-25: 50 ug via INTRAVENOUS

## 2017-08-25 MED ORDER — DEXAMETHASONE SODIUM PHOSPHATE 10 MG/ML IJ SOLN
INTRAMUSCULAR | Status: DC | PRN
  Administered 2017-08-25: 5 mg via INTRAVENOUS

## 2017-08-25 SURGICAL SUPPLY — 24 items
CANISTER SUCT 3000ML PPV (MISCELLANEOUS) ×3 IMPLANT
CATH ROBINSON RED A/P 16FR (CATHETERS) ×3 IMPLANT
CLOTH BEACON ORANGE TIMEOUT ST (SAFETY) ×3 IMPLANT
COUNTER NEEDLE 1200 MAGNETIC (NEEDLE) ×3 IMPLANT
DEVICE MYOSURE LITE (MISCELLANEOUS) IMPLANT
DEVICE MYOSURE REACH (MISCELLANEOUS) ×3 IMPLANT
DILATOR CANAL MILEX (MISCELLANEOUS) IMPLANT
FILTER ARTHROSCOPY CONVERTOR (FILTER) ×3 IMPLANT
GLOVE BIO SURGEON STRL SZ7.5 (GLOVE) ×6 IMPLANT
GOWN STRL REUS W/TWL LRG LVL3 (GOWN DISPOSABLE) ×3 IMPLANT
IV NS IRRIG 3000ML ARTHROMATIC (IV SOLUTION) ×3 IMPLANT
KIT TURNOVER CYSTO (KITS) ×3 IMPLANT
MYOSURE XL FIBROID REM (MISCELLANEOUS)
NDL SAFETY ECLIPSE 18X1.5 (NEEDLE) IMPLANT
NEEDLE HYPO 18GX1.5 SHARP (NEEDLE)
PACK VAGINAL MINOR WOMEN LF (CUSTOM PROCEDURE TRAY) ×3 IMPLANT
PAD OB MATERNITY 4.3X12.25 (PERSONAL CARE ITEMS) ×3 IMPLANT
SEAL ROD LENS SCOPE MYOSURE (ABLATOR) ×3 IMPLANT
SYRINGE LUER LOK 1CC (MISCELLANEOUS) IMPLANT
SYSTEM TISS REMOVAL MYSR XL RM (MISCELLANEOUS) IMPLANT
TOWEL OR 17X24 6PK STRL BLUE (TOWEL DISPOSABLE) ×6 IMPLANT
TUBING AQUILEX INFLOW (TUBING) ×3 IMPLANT
TUBING AQUILEX OUTFLOW (TUBING) ×3 IMPLANT
WATER STERILE IRR 500ML POUR (IV SOLUTION) IMPLANT

## 2017-08-25 NOTE — Anesthesia Procedure Notes (Signed)
Procedure Name: LMA Insertion Date/Time: 08/25/2017 9:23 AM Performed by: Bonney Aid, CRNA Pre-anesthesia Checklist: Patient identified, Emergency Drugs available, Suction available and Patient being monitored Patient Re-evaluated:Patient Re-evaluated prior to induction Oxygen Delivery Method: Circle system utilized Preoxygenation: Pre-oxygenation with 100% oxygen Induction Type: IV induction Ventilation: Mask ventilation without difficulty LMA: LMA inserted LMA Size: 4.0 Number of attempts: 1 Airway Equipment and Method: Bite block Placement Confirmation: positive ETCO2 Tube secured with: Tape Dental Injury: Teeth and Oropharynx as per pre-operative assessment

## 2017-08-25 NOTE — Discharge Instructions (Signed)

## 2017-08-25 NOTE — Addendum Note (Signed)
Addended by: Anastasio Auerbach on: 08/25/2017 02:13 PM   Modules accepted: Orders

## 2017-08-25 NOTE — Telephone Encounter (Signed)
Pharmacy called regarding patient pain Rx this am for perocet 2.5/325 this medication is not in stock at the pharmacy. However they do have the current strengths 5/325 mg, 7.5/325mg , 10/325 mg. Please advise

## 2017-08-25 NOTE — Anesthesia Preprocedure Evaluation (Addendum)
Anesthesia Evaluation  Patient identified by MRN, date of birth, ID band Patient awake    Reviewed: Allergy & Precautions, NPO status , Patient's Chart, lab work & pertinent test results  History of Anesthesia Complications (+) PONV  Airway Mallampati: II  TM Distance: >3 FB Neck ROM: Full    Dental  (+) Dental Advisory Given   Pulmonary sleep apnea ,    Pulmonary exam normal breath sounds clear to auscultation       Cardiovascular hypertension, Pt. on medications Normal cardiovascular exam Rhythm:Regular Rate:Normal     Neuro/Psych Vertigo negative psych ROS   GI/Hepatic negative GI ROS, Neg liver ROS,   Endo/Other  Obesity  Renal/GU Nephrolithiasis  negative genitourinary   Musculoskeletal  (+) Arthritis ,   Abdominal   Peds  Hematology negative hematology ROS (+)   Anesthesia Other Findings   Reproductive/Obstetrics                           Anesthesia Physical Anesthesia Plan  ASA: II  Anesthesia Plan: General   Post-op Pain Management:    Induction: Intravenous  PONV Risk Score and Plan: 4 or greater and Treatment may vary due to age or medical condition, Ondansetron, Scopolamine patch - Pre-op and Dexamethasone  Airway Management Planned: LMA  Additional Equipment: None  Intra-op Plan:   Post-operative Plan: Extubation in OR  Informed Consent: I have reviewed the patients History and Physical, chart, labs and discussed the procedure including the risks, benefits and alternatives for the proposed anesthesia with the patient or authorized representative who has indicated his/her understanding and acceptance.   Dental advisory given  Plan Discussed with: CRNA and Anesthesiologist  Anesthesia Plan Comments:         Anesthesia Quick Evaluation

## 2017-08-25 NOTE — Addendum Note (Signed)
Addended by: Thamas Jaegers on: 08/25/2017 11:59 AM   Modules accepted: Orders

## 2017-08-25 NOTE — Telephone Encounter (Signed)
Dr. Phineas Real the pharmacy called back again regarding the new Rx you sent in for oxycodone-acetaminophen 5/300 mg. They do not have this. Only the ones that are listed below. I entered the 5/325 dose and it is pending if this is the dose you want the patient to have,I put the same directions you had on previous Rx with # 5 tablets. Just sign and the other will be removed. Please advise

## 2017-08-25 NOTE — Anesthesia Postprocedure Evaluation (Signed)
Anesthesia Post Note  Patient: Jessica Gibson  Procedure(s) Performed: DILATATION & CURETTAGE/HYSTEROSCOPY WITH MYOSURE (N/A )     Patient location during evaluation: PACU Anesthesia Type: General Level of consciousness: awake and alert Pain management: pain level controlled Vital Signs Assessment: post-procedure vital signs reviewed and stable Respiratory status: spontaneous breathing, nonlabored ventilation and respiratory function stable Cardiovascular status: blood pressure returned to baseline and stable Postop Assessment: no apparent nausea or vomiting Anesthetic complications: no    Last Vitals:  Vitals:   08/25/17 1015 08/25/17 1030  BP: 133/62 (!) 126/54  Pulse: (!) 51 (!) 54  Resp: 17 14  Temp:    SpO2: 100% 96%    Last Pain:  Vitals:   08/25/17 0734  TempSrc: Oral                 Audry Pili

## 2017-08-25 NOTE — H&P (Signed)
The patient was examined.  I reviewed the proposed surgery and consent form with the patient.  The dictated history and physical is current and accurate and all questions were answered. The patient is ready to proceed with surgery and has a realistic understanding and expectation for the outcome.   Anastasio Auerbach MD, 9:10 AM 08/25/2017

## 2017-08-25 NOTE — Transfer of Care (Signed)
Immediate Anesthesia Transfer of Care Note  Patient: Jessica Gibson  Procedure(s) Performed: DILATATION & CURETTAGE/HYSTEROSCOPY WITH MYOSURE (N/A )  Patient Location: PACU  Anesthesia Type:General  Level of Consciousness: awake, alert  and oriented  Airway & Oxygen Therapy: Patient Spontanous Breathing and Patient connected to nasal cannula oxygen  Post-op Assessment: Report given to RN  Post vital signs: Reviewed and stable  Last Vitals:  Vitals:   08/25/17 0734 08/25/17 0954  BP: (!) 156/69 132/75  Pulse: (!) 55 (!) 58  Resp: 18 10  Temp: 36.6 C 36.6 C  SpO2: 98% 100%    Last Pain:  Vitals:   08/25/17 0734  TempSrc: Oral      Patients Stated Pain Goal: 5 (38/18/40 3754)  Complications: No apparent anesthesia complications

## 2017-08-25 NOTE — Op Note (Signed)
Jessica Gibson 11/23/1945 [id number]   Post Operative Note   Date of surgery:  08/25/2017  Pre Op Dx: Endometrial polyp  Post Op Dx: Endometrial polyp  Procedure: Hysteroscopy, D&C, Myosure resection endometrial polyp  Surgeon:  Anastasio Auerbach  Anesthesia:  General  EBL: 5 cc  Distended media discrepancy: 884 cc saline  Complications:  None  Specimen: #1 endometrial curetting #2 endometrial polyp to pathology  Findings: EUA: External BUS vagina with atrophic changes.  Cervix with atrophic changes.  Uterus normal size midline mobile.  Adnexa without masses   Hysteroscopy: Adequate noting fundus, right/left tubal ostia, anterior/posterior endometrial surfaces, lower uterine segment and endocervical canal visualized.  Large endometrial polyp filling the endometrial cavity from posterior endometrial surface, resected in it's entirety.  Endometrium otherwise noted to be atrophic in appearance.  Procedure:  The patient was taken to the operating room, was placed in the low dorsal lithotomy position, underwent general anesthesia, received a perineal/vaginal preparation with Betadine solution per nursing personnel and the bladder was emptied with in and out Foley catheterization. The timeout was performed by the surgical team. An EUA was performed. The patient was draped in the usual fashion. The cervix was visualized with a speculum, anterior lip grasped with a single-tooth tenaculum and a paracervical block was placed using 10 cc's of 1% lidocaine. The cervix was gently dilated to admit the Myosure hysteroscope and hysteroscopy was performed with findings noted above. Using the Myosure Reach resectoscopic wand the polyp was resected in it's entirety to the level the surrounding endometrium. A gentle sharp curettage was performed. Both specimens were sent separately to pathology.  Repeat hysteroscopy showed an empty cavity with good distention and no evidence of perforation. The instruments were  removed and adequate hemostasis was visualized at the tenaculum site and external cervical os.  The specimens were labeled for pathology.  The sponge, needle and instrument count were verified correct.  The patient was given intraoperative Toradol, was awakened without difficulty and was taken to the recovery room in good condition having tolerated the procedure well.   Anastasio Auerbach MD, 9:51 AM 08/25/2017

## 2017-08-26 ENCOUNTER — Encounter (HOSPITAL_BASED_OUTPATIENT_CLINIC_OR_DEPARTMENT_OTHER): Payer: Self-pay | Admitting: Gynecology

## 2017-08-27 ENCOUNTER — Ambulatory Visit (INDEPENDENT_AMBULATORY_CARE_PROVIDER_SITE_OTHER): Payer: Medicare Other | Admitting: Neurology

## 2017-08-27 DIAGNOSIS — I10 Essential (primary) hypertension: Secondary | ICD-10-CM

## 2017-08-27 DIAGNOSIS — G4730 Sleep apnea, unspecified: Secondary | ICD-10-CM

## 2017-08-27 DIAGNOSIS — G4700 Insomnia, unspecified: Secondary | ICD-10-CM

## 2017-08-27 DIAGNOSIS — R0683 Snoring: Secondary | ICD-10-CM

## 2017-08-27 DIAGNOSIS — G4733 Obstructive sleep apnea (adult) (pediatric): Secondary | ICD-10-CM | POA: Diagnosis not present

## 2017-09-03 NOTE — Procedures (Signed)
PATIENT'S NAME:  Jessica Gibson, Jessica Gibson DOB:      01/01/1946      MR#:    [id number]     DATE OF RECORDING: 08/27/2017 REFERRING M.D.:  Jessica Gibson, M.D. Study Performed:   Titration to PAP HISTORY:  This patient's diagnostic polysomnogram performed on 07/09/2017 revealed an (AHI) of 22.7/hour. , and REM AHI of 40/hour, versus a non-REM AHI of 22.2/hr. The supine AHI was 69.2/hr. Time spent below 89% saturation equaled 49 minutes. The patient returned for treatment of moderate OSA with hypoxemia. Patient carried a diagnosis of rhinitis, claustrophobia (interfering with compliance) and poorly controlled HTN.  A previous PSG in 03-2014 had diagnosed an AHI of 12.9, REM AHI of 29, supine of 28.5/hr.  The patient endorsed the Epworth Sleepiness Scale at 4 points and the Fatigue Score at 18 points.   The patient's weight 172 pounds with a height of 62 (inches), resulting in a BMI of 31.6 kg/m2. The patient's neck circumference measured 16 inches.  CURRENT MEDICATIONS: Norvasc, Vitamin D2, Pepcid, Flonase, Advil, Hyzaar, Antivert, Lopressor,    PROCEDURE:  This is a multichannel digital polysomnogram utilizing the SomnoStar 11.2 system.  Electrodes and sensors were applied and monitored per AASM Specifications.   EEG, EOG, Chin and Limb EMG, were sampled at 200 Hz.  ECG, Snore and Nasal Pressure, Thermal Airflow, Respiratory Effort, CPAP Flow and Pressure, Oximetry was sampled at 50 Hz. Digital video and audio were recorded.      CPAP was initiated at 5 cmH20 with heated humidity per AASM split night standards and pressure was advanced to 6 cmH20 because of hypopneas, apneas and desaturations.  At a PAP pressure of 6 cmH20, there was a reduction of the AHI to 0.0 with improvement of obstructive sleep apnea.   Lights Out was at 22:24 and Lights On at 05:00. Total recording time (TRT) was 397 minutes, with a total sleep time (TST) of 202.5 minutes. The patient's sleep latency was 80 minutes with 122 minutes of wake  time after sleep onset.  REM latency was 240.5 minutes.  The sleep efficiency was 51.0 %.    SLEEP ARCHITECTURE: WASO (Wake after sleep onset) was 116 minutes.  There were 7 minutes in Stage N1, 120.5 minutes Stage N2, 46.5 minutes Stage N3 and 28.5 minutes in Stage REM.  The percentage of Stage N1 was 3.5%, Stage N2 was 59.5%, and Stage N3 was 23.0 %, and Stage R (REM sleep) was 14.1%.   RESPIRATORY ANALYSIS:  There was a total of 17 respiratory events: 3 obstructive apneas, 6 central apneas and 4 mixed apneas with a total of 13 apneas and an apnea index (AI) of 3.9 /hour. There were 4 hypopneas with a hypopnea index of 1.2/hour. The patient also had 0 respiratory event related arousals (RERAs).     The total APNEA/HYPOPNEA INDEX (AHI) was 5.0 /hour and the total RESPIRATORY DISTURBANCE INDEX was 5.0/ hour. No events occurred in REM sleep and 17 events in NREM. The REM AHI was 0 /hour versus a non-REM AHI of 5.9 /hour.  The patient spent 6 minutes of total sleep time in the supine position and 197 minutes in non-supine. The supine AHI was 0.0, versus a non-supine AHI of 5.2.  OXYGEN SATURATION & C02:  The baseline 02 saturation was 95%, with the lowest being 88%. Time spent below 89% saturation equaled 33 minutes. PERIODIC LIMB MOVEMENTS:    The patient had a total of 0 Periodic Limb Movements. The arousals were noted as: 26  were spontaneous, 0 were associated with PLMs, and 9 were associated with respiratory events. Audio and video analysis did not show any abnormal or unusual movements, behaviors, phonations or vocalizations. The patient took one bathroom breaks. Snoring was noted. EKG was in keeping with normal sinus rhythm (NSR) with PVCs.  DIAGNOSIS : Insomnia - sleep latency and WASO time were long and delayed the initiation of CPAP therapy for Obstructive Sleep Apnea. OSA resolved at very low CPAP pressure of only 6 cm water.   PLANS/RECOMMENDATIONS:  I appreciate that Jessica Gibson was willing  to give CPAP a chance but she obviously struggles with this treatment. Post-study, the patient indicated that sleep under CPAP was not feeling natural and that she felt not well rested. The patient was fitted with a small size P-10 nasal pillow interface. I would like the patient to use CPAP for 30 days - as a trial phase- and CPAP will be d/c if not tolerated.  1. Treatment options alternative to CPAP need to be discussed - REM accentuated sleep apnea is usually not improved by a dental device, but the dental device can help with snoring. Will discuss the Inspire device, and Weight loss, as well as avoiding supine sleep- all can reduce apnea significantly (see supine AHI ). 2. Further recommended are:1) Improvement of nasal patency, 2)avoidance of medications with muscle relaxant properties and alcohol, as indicated.  A follow up appointment will be scheduled in the Sleep Clinic at [facility].   Please call [phone] with any questions.     I certify that I have reviewed the entire raw data recording prior to the issuance of this report in accordance with the Standards of Accreditation of the American Academy of Sleep Medicine (AASM)      Larey Seat, M.D.    09-03-2017  Diplomat, American Board of Psychiatry and Neurology  Diplomat, [place] of Sleep Medicine Medical Director of [facility] at [facility]

## 2017-09-03 NOTE — Addendum Note (Signed)
Addended by: Larey Seat on: 09/03/2017 01:07 PM   Modules accepted: Orders

## 2017-09-07 ENCOUNTER — Telehealth: Payer: Self-pay | Admitting: Neurology

## 2017-09-07 DIAGNOSIS — Z9989 Dependence on other enabling machines and devices: Principal | ICD-10-CM

## 2017-09-07 DIAGNOSIS — G4733 Obstructive sleep apnea (adult) (pediatric): Secondary | ICD-10-CM

## 2017-09-07 NOTE — Telephone Encounter (Signed)
I called Jessica Gibson. I advised Jessica Gibson that Dr. Brett Fairy reviewed their sleep study results and found that Jessica Gibson has sleep apnea. Dr. Brett Fairy recommends that Jessica Gibson starts CPAP at 6.6 cm of water pressure for a 30 day trial. I reviewed PAP compliance expectations with the Jessica Gibson. Jessica Gibson is agreeable to starting a CPAP. I advised Jessica Gibson that an order will be sent to a DME, [facility], and [facility] will call the Jessica Gibson within about one week after they file with the Jessica Gibson's insurance. [facility] will show the Jessica Gibson how to use the machine, fit for masks, and troubleshoot the CPAP if needed. A follow up appt was made for insurance purposes with Dr. Brett Fairy on November 09, 2017 at 8:30 am. Jessica Gibson verbalized understanding to arrive 15 minutes early and bring their CPAP. A letter with all of this information in it will be mailed to the Jessica Gibson as a reminder. I verified with the Jessica Gibson that the address we have on file is correct. Jessica Gibson verbalized understanding of results. Jessica Gibson had no questions at this time but was encouraged to call back if questions arise.

## 2017-09-07 NOTE — Telephone Encounter (Signed)
-----   Message from Larey Seat, MD sent at 09/03/2017  1:07 PM EDT ----- DIAGNOSIS : Insomnia - sleep latency and WASO time were long and  delayed the initiation of CPAP therapy for Obstructive Sleep  Apnea. OSA resolved at very low CPAP pressure of only 6 cm water.    PLANS/RECOMMENDATIONS: I appreciate that Jessica Gibson was willing to give CPAP a chance but she obviously struggles with this treatment. Post-study, the patient indicated that sleep under CPAP was not feeling natural and that she felt not well rested.  The patient was fitted with a small size P-10 nasal pillow interface.  I would like the patient to use CPAP for 30 days -Please have patient measure her BP once or twice week in AM and keep a record.  CPAP therapy as a trial phase- ( and CPAP will be d/c if not tolerated).  1. Treatment options alternative to CPAP need to be discussed if the patient cannot use CPAP at home -

## 2017-09-09 ENCOUNTER — Ambulatory Visit (INDEPENDENT_AMBULATORY_CARE_PROVIDER_SITE_OTHER): Payer: Medicare Other | Admitting: Gynecology

## 2017-09-09 ENCOUNTER — Encounter: Payer: Self-pay | Admitting: Gynecology

## 2017-09-09 VITALS — BP 118/78

## 2017-09-09 DIAGNOSIS — Z9889 Other specified postprocedural states: Secondary | ICD-10-CM

## 2017-09-09 NOTE — Patient Instructions (Signed)
Follow-up end of summer for annual exam

## 2017-09-09 NOTE — Progress Notes (Signed)
    Jessica Gibson 24-Nov-1945 [id number]        72 y.o.  G2P2 presents for her postoperative visit status post hysteroscopic resection endometrial polyps.  Has done well with no complaints or bleeding.  Past medical history,surgical history, problem list, medications, allergies, family history and social history were all reviewed and documented in the EPIC chart.  Directed ROS with pertinent positives and negatives documented in the history of present illness/assessment and plan.  Exam: [name] assistant Vitals:   09/09/17 1059  BP: 118/78   General appearance:  Normal Abdomen soft nontender without masses guarding rebound Pelvic external BUS vagina with atrophic changes.  Cervix with atrophic changes.  Uterus grossly normal size midline mobile nontender.  Adnexa without masses or tenderness.  Assessment/Plan:  72 y.o. G2P2 with normal postoperative visit status post hysteroscopic resection endometrial polyps.  Reviewed benign pathology and pictures from the surgery with her.  Patient will follow-up end of summer for annual exam, sooner if any issues.    Anastasio Auerbach MD, 11:11 AM 09/09/2017

## 2017-09-24 DIAGNOSIS — Z1231 Encounter for screening mammogram for malignant neoplasm of breast: Secondary | ICD-10-CM | POA: Diagnosis not present

## 2017-09-29 NOTE — Telephone Encounter (Signed)
Spoke with Jessica Gibson who sts. she is not able to tolerate CPAP--difficulty going to sleep and wakes up feeling as if the top of her head is going to blow off. I have advised that she call [facility]; explained that they will be able to eval pressures to see if they need to be adjusted; other measures to help increase her tolerability/compliance.  She verbalized understanding of same/fim

## 2017-09-29 NOTE — Telephone Encounter (Signed)
Patient was given trial CPAP from [facility] on 09-21-17. She could only tolerate machine for 2-3-hours because it makes her very dizzy and disoriented. She discontinued using machine. Please call and discuss.

## 2017-10-06 NOTE — Telephone Encounter (Signed)
Decrease pressure to 5.5 cm water and give it 14 days, if not tolerated, will change away from CPAP to dental device or INSPIRE .

## 2017-10-06 NOTE — Telephone Encounter (Signed)
Patient is having difficulty with using the cpap as she felt she would. The pressure is only set at 6.6 cm. When I pull the 14 day download the pt has only used it for 4 days. I have placed the report for Dr Dohmeier to review to see if its worth assessing if the patient can have a decrease in her pressure or what she recommends. Will call pt back once I have discussed with Dr Brett Fairy.

## 2017-10-06 NOTE — Telephone Encounter (Signed)
Patient called [facility] yesterday regarding changing pressure on her CPAP. She was told by [facility] the  change in pressure would have to come from Dr,. Dohmeier.

## 2017-10-06 NOTE — Addendum Note (Signed)
Addended by: Larey Seat on: 10/06/2017 04:19 PM   Modules accepted: Orders

## 2017-10-07 NOTE — Telephone Encounter (Signed)
Called the pt and made her aware tht Dr Brett Fairy reviewed her cpap download. Informed her that in the 4-5 days she used it, the Cpap was working well at treating her apnea. Pt states that she would wake up from the pressure that she felt and she was unable to sleep. I informed her that she was a pretty low pressure but that Dr Brett Fairy will decrease the pressure to 5.5 cm of water pressure. Strongly encouraged the pt to use for another 14 days and to really give it a chance. The pt laughed and states that she will try but if she cant sleep then she will not continue with it. I informed her that we will see what other options (Ex dental device or inspire) would work for her after the trial.  Order sent to [facility] at this time.

## 2017-11-09 ENCOUNTER — Ambulatory Visit (INDEPENDENT_AMBULATORY_CARE_PROVIDER_SITE_OTHER): Payer: Medicare Other | Admitting: Neurology

## 2017-11-09 ENCOUNTER — Encounter: Payer: Self-pay | Admitting: Neurology

## 2017-11-09 VITALS — BP 152/79 | HR 59 | Ht 62.0 in | Wt 167.0 lb

## 2017-11-09 DIAGNOSIS — Z789 Other specified health status: Secondary | ICD-10-CM | POA: Diagnosis not present

## 2017-11-09 DIAGNOSIS — G4733 Obstructive sleep apnea (adult) (pediatric): Secondary | ICD-10-CM

## 2017-11-09 NOTE — Progress Notes (Signed)
[place]   Provider:  Larey Seat, M D  Primary Care Physician:  Jessica Battles, MD   Referring Provider: Leanna Battles, MD    HPI:  04-2017, Jessica Gibson is a 72 y.o. female , seen here as in a referral  from Dr. Philip Gibson for a new evaluation of possible obstructive sleep apnea based on serially elevated blood pressure readings. She has rhinitis, sleeps on her side and has been claustrophobic - this interferes with CPAP use.   Years ago, she underwent a PSG study ( October 21st  2015), was diagnosed with mild obstructive sleep apnea and an AHI of 12.9/ hr. , but an RDI of 24.2/hr.  indicated that she was snoring loudly.   During REM sleep her AHI exacerbated to 29/h of supine sleep to 28.5.   Oxygen nadir was 86% SPO2 was 16.2 minutes of desaturation time, there was no CO2 retention noted, no periodic limb movements were seen.   Her heart rate did not vary greatly during the sleep study.    I felt that the polysomnography revealed obstructive sleep apnea and upper airway resistance sleep, and that CPAP titration was not option but not an oral appliance or an ENT evaluation for procedure were also treatment possibilities and ,in addition, weight loss.  Jessica Gibson also reports that she has struggled with elevated blood pressure readings since age 83, she has gained weight.  This may have caused a higher degree of sleep apnea. BP readings a have been all over the map-  Up to 341 mmHg systolic.   Sleep habits are as follows:  She lives alone.  The patient's intended bedtime is usually 11 PM but once in bed sleeping on her side she has been woken by a congested nasal passage. She has been prescribed a nasal spray by Dr. Wilburn Gibson that helps her to keep the nasal passage open for exactly 6 hours, after which time she will wake up congested again difficult is to return to sleep without using again medication.  She also has benign positional vertigo arising from the right  ear for the limits her choice of sleep positions.  She sleeps on a flat mattress, 2 pillows for head support.  She wakes up after 6 hours of sleep usually with the urge to go and urinate as well as with a congested nose.  Her sleep is otherwise not very fragmented. She estimates that she gets nocturnally 6 hours of good sleep, she rises in the morning at 7 AM, wakes up spontaneously. He does have arthritic pain joint pain, but there is no headache, dizziness in the morning, nausea, diaphoresis or palpitation.  Does wake up with a very dry mouth indicating that she may still be snoring.   Sleep medical history and family sleep history: I reviewed Jessica Gibson on previous sleep study results, I also reviewed her medication list, the patient has a history of untreated obstructive sleep apnea, hypertension currently on 5  medications including minoxidil, Norvasc, metoprolol and Hyzaar.  Hyzaar is a combination product of losartan and hydrochlorothiazide. She still has elevated blood pressures in the 962I systolic.  She also has hyperlipidemia but cannot tolerate statins, she has lower back pain and joint pain.  Renovascular hypertension?   Social history:  Widowed with 2 sons , 62 and 63. One grandson . 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    11-09-2017, Jessica.  I have the pleasure of meeting with Jessica Gibson today, meanwhile 72 year old Caucasian female  patient but underwent a repeat sleep study on 09 July 2017, upon referral by Dr. Bevelyn Gibson.  Her previous AHI in 2015 was 12.9 but she had a high RDI indicating snoring at 24.2.  This repeat sleep study showed very low sleep efficiency she only slept for about 42% of the time in the lab, had very little REM sleep at 2.7%, her AHI was mild to moderate at 22.7/h and here her RDI was similar.  During REM sleep apnea and hypercapnia was increased to 40/h and in supine sleep to 69.2/h.  The total time spent below 89% oxygen saturation equal to 49 minutes, given  that she slept only 169 minutes this was a large proportion.  She had few periodic limb movements.  I ordered an attended sleep study CPAP titration, which was performed on the CPAP titration was performed on 27 August 2017 again the patient was not excessively daytime sleepiness fatigue.  Her BMI is 31.6 she is not morbidly obese.  During the titration we reached an apnea hypopnea index of 5/h.  I realized that she had already previously struggled with CPAP but she gave it a good trial at 6 cm water pressure.  Sleep efficiency was still 51% during the CPAP titration she used a small size P 10 nasal pillow.  She still felt it was not comfortable.  I allowed for an auto titration.  The data here are not promising.  She is used in auto-titrator at 6 cmH2O but could not tolerate it well I reduce the set pressure to 5.6 cm water and her obstructive apneas rose to 3.9 and her hypopneas also known as shallow breathing, rose to 3.2. Overall AHI was over 8.   In other words she does not tolerate CPAP she could not use the machine every 4 hours or longer in the last 90 days and over the last 30 days only used it on 8 days at all.  We are discussing therefore to change her to a different modality CPAP was considered optimal because of the hypoxemia involved, the REM accentuated apnea.   We can also certainly reduce the apnea by the patient avoiding sleeping supine, losing weight, improving exercise and respiratory depth, I would also like to consider her snoring to be treated with a dental device.  My goal here is to reduce the apnea I know that I cannot completely alleviate it.  I hope that this dental device would be easier to tolerate for her in comparison to the CPAP.  She gave CPAP a good trial and I thank her for it but I think we need to go either to the inspire procedure for to a dental device to help her further reducing her apnea index.  While the inspire procedure is a surgical procedure with an implantable small  pacemaker triggering hypoglossal tone and quite expensive, the dental device is less invasive.    Review of Systems: Out of a complete 14 system review, the patient complains of only the following symptoms, and all other reviewed systems are negative. Snoring. Claustrophobia, congested nasal passage   Epworth score  4 , Fatigue severity score 18  , depression score 1/1 5    Social History   Socioeconomic History  . Marital status: Widowed    Spouse name: Not on file  . Number of children: 2  . Years of education: College  . Highest education level: Not on file  Social Needs  . Financial resource strain: Not on file  .  Food insecurity - worry: Not on file  . Food insecurity - inability: Not on file  . Transportation needs - medical: Not on file  . Transportation needs - non-medical: Not on file  Occupational History    Employer: SELF EMPLOYED  Tobacco Use  . Smoking status: Never Smoker  . Smokeless tobacco: Never Used  Substance and Sexual Activity  . Alcohol use: Yes    Alcohol/week: 0.0 oz    Comment: seldom  . Drug use: No  . Sexual activity: No    Birth control/protection: Post-menopausal    Comment: 1st intercourse 72 yo-Fewer than 5 partners  Other Topics Concern  . Not on file  Social History Narrative   Patient is widowed and lives alone.   Patient has two adult children.   Patient is self-employed, semi-retired.   Patient is right-handed.   Patient drinks three cups of coffee daily.   Patient has some college education.    Family History  Problem Relation Age of Onset  . Diabetes Mother   . Hypertension Mother   . Heart disease Mother   . Heart disease Father   . Other Father        circulation problems  . Colon cancer Neg Hx     Past Medical History:  Diagnosis Date  . Arthritis    KNEE  . Atrophic vaginitis   . CIN I (cervical intraepithelial neoplasia I) 03/2004   LEEP  . Heart murmur   . Hypercholesteremia   . Hypertension   . OSA  (obstructive sleep apnea)   . Other malaise and fatigue 02/28/2014  . Post-operative nausea and vomiting   . Snoring 02/28/2014    Past Surgical History:  Procedure Laterality Date  . ARTHROSCOPY OF KNEE    . CERVICAL BIOPSY  W/ LOOP ELECTRODE EXCISION  2005  . JOINT REPLACEMENT Right    knee  . 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 amLODipine (NORVASC) 5 MG tablet Take 5 mg by mouth daily.      . ergocalciferol (VITAMIN D2) 50000 UNITS capsule Take 50,000 Units by mouth every 30 (thirty) days.    . famotidine (PEPCID) 10 MG tablet Take 10 mg daily as needed by mouth for heartburn or indigestion.    . fluticasone (FLONASE) 50 MCG/ACT nasal spray SHAKE LIQUID AND USE 2 SPRAYS IN EACH NOSTRIL DAILY    . ibuprofen (ADVIL,MOTRIN) 200 MG tablet Take 400 mg by mouth every 6 (six) hours as needed for moderate pain (pain).    Marland Kitchen losartan-hydrochlorothiazide (HYZAAR) 100-12.5 MG per tablet Take 0.5 tablets by mouth 2 (two) times daily.     . meclizine (ANTIVERT) 25 MG tablet Take 1 tablet (25 mg total) by mouth 2 (two) times daily as needed for dizziness or nausea. 30 tablet 0  . metoprolol (LOPRESSOR) 50 MG tablet Take 50 mg by mouth 2 (two) times daily.      . minoxidil (LONITEN) 2.5 MG tablet Take 2.5 mg by mouth daily.     No current facility-administered medications for this visit.     Allergies as of 04/21/2017  . (No Known Allergies)    Vitals: BP (!) 170/82 Comment: manually  Pulse (!) 59   Ht 5\' 2"  (1.575 m)   Wt 173 lb (78.5 kg)   BMI 31.64 kg/m  Last Weight:  Wt Readings from Last 1 Encounters:  04/21/17 173 lb (78.5 kg)  KDT:OIZT mass index is 31.64 kg/m.     Last Height:   Ht Readings from Last 1 Encounters:  04/21/17 5\' 2"  (1.575 m)    Physical exam:  General: The patient is awake, alert and appears not in acute distress. The patient is well groomed. Head: Normocephalic, atraumatic. Neck is  supple. Mallampati  4 neck circumference: 16. Nasal airflow congested, Retrognathia is seen.  Cardiovascular:  Regular rate and rhythm , without  murmurs or carotid bruit, and without distended neck veins. Respiratory: Lungs are clear to auscultation. Skin:  Without evidence of edema, or rash Trunk: BMI is 32 . The patient's posture is erect  Neurologic exam : The patient is awake and alert, oriented to place and time.   Attention span & concentration ability appears normal.  Speech is fluent,  without dysarthria, dysphonia or aphasia.  Mood and affect are appropriate.  Cranial nerves: Pupils are equal and briskly reactive to light. Extraocular movements  in vertical and horizontal planes intact and without nystagmus. Visual fields by finger perimetry are intact. Hearing to finger rub intact.  Facial sensation intact to fine touch. Facial motor strength is symmetric and tongue and uvula move midline. Shoulder shrug was symmetrical.  Motor exam: Normal tone, muscle bulk and symmetric strength in all extremities. Sensory:  Proprioception tested in the upper extremities was normal. Deep tendon reflexes: in the upper and lower extremities are symmetric and intact. Babinski maneuver response is downgoing.  Assessment:  After physical and neurologic examination, review of laboratory studies,  Personal review of imaging studies, reports of other /same  Imaging studies, results of polysomnography and / or neurophysiology testing and pre-existing records as far as provided in visit., my assessment is .   1) The type of OSA apnea she has can be reduced by dental device or inspire procedure , by sleeping on her side and not on her back.  She also should pursue weight loss by diet and exercise.  She has excellent sleep hygiene, she does not drink she does not smoke and she is a very moderate caffeine drinker.   The patient was advised of the nature of the diagnosed disorder , the treatment options and  the  risks for general health and wellness arising from not treating the condition.   I spent more than 25 minutes of face to face time with the patient. Greater than 50% of time was spent in counseling and coordination of care. We have discussed the diagnosis and differential and I answered the patient's questions.    Plan:  Treatment plan and additional workup :  Dental device referral to Dr. Augustina Mood . The patient understands that a dental device is likely r to reduce but not alleviate AHI and cannot help with hypoxemia.  Rv with me.   Larey Seat, MD 24/58/0998, 3:38 PM  Certified in Neurology by ABPN Certified in [place] by [facility] [address], [place], [place] [address]

## 2017-11-10 DIAGNOSIS — H40053 Ocular hypertension, bilateral: Secondary | ICD-10-CM | POA: Diagnosis not present

## 2017-11-10 DIAGNOSIS — H524 Presbyopia: Secondary | ICD-10-CM | POA: Diagnosis not present

## 2017-11-10 DIAGNOSIS — H25012 Cortical age-related cataract, left eye: Secondary | ICD-10-CM | POA: Diagnosis not present

## 2017-11-10 DIAGNOSIS — H2513 Age-related nuclear cataract, bilateral: Secondary | ICD-10-CM | POA: Diagnosis not present

## 2018-01-20 ENCOUNTER — Encounter: Payer: Medicare Other | Admitting: Gynecology

## 2018-01-25 ENCOUNTER — Ambulatory Visit (INDEPENDENT_AMBULATORY_CARE_PROVIDER_SITE_OTHER): Payer: Medicare Other | Admitting: Gynecology

## 2018-01-25 ENCOUNTER — Encounter: Payer: Self-pay | Admitting: Gynecology

## 2018-01-25 VITALS — BP 124/80 | Ht 62.0 in | Wt 170.0 lb

## 2018-01-25 DIAGNOSIS — N952 Postmenopausal atrophic vaginitis: Secondary | ICD-10-CM

## 2018-01-25 DIAGNOSIS — Z01419 Encounter for gynecological examination (general) (routine) without abnormal findings: Secondary | ICD-10-CM | POA: Diagnosis not present

## 2018-01-25 NOTE — Patient Instructions (Signed)
Follow-up in 1 year for annual exam, sooner if any issues. 

## 2018-01-25 NOTE — Progress Notes (Signed)
    Jessica Gibson 1945/12/07 [id number]        72 y.o.  G2P2 for breast and pelvic exam.  Doing well without complaints.  Had hysteroscopy D&C with resection of benign endometrial polyp earlier this year.  Has done well with no bleeding since.  Past medical history,surgical history, problem list, medications, allergies, family history and social history were all reviewed and documented as reviewed in the EPIC chart.  ROS:  Performed with pertinent positives and negatives included in the history, assessment and plan.   Additional significant findings : None   Exam: [name] assistant Vitals:   01/25/18 0930  BP: 124/80  Weight: 170 lb (77.1 kg)  Height: 5\' 2"  (1.575 m)   Body mass index is 31.09 kg/m.  General appearance:  Normal affect, orientation and appearance. Skin: Grossly normal HEENT: Without gross lesions.  No cervical or supraclavicular adenopathy. Thyroid normal.  Lungs:  Clear without wheezing, rales or rhonchi Cardiac: RR, without RMG Abdominal:  Soft, nontender, without masses, guarding, rebound, organomegaly or hernia Breasts:  Examined lying and sitting without masses, retractions, discharge or axillary adenopathy. Pelvic:  Ext, BUS, Vagina: Normal with atrophic changes  Cervix: With atrophic changes  Uterus: Grossly normal size midline mobile nontender  Adnexa: Without masses or tenderness    Anus and perineum: Normal   Rectovaginal: Normal sphincter tone without palpated masses or tenderness.    Assessment/Plan:  72 y.o. G2P2 female for breast and pelvic exam.   1. Postmenopausal/atrophic genital changes.  No significant menopausal symptoms or any bleeding.  Continue to monitor and report any issues or bleeding. 2. Mammography 09/2017.  Continue with annual mammography when due next year.  Breast exam normal today. 3. Pap smear 2018.  No Pap smear done today.  History of LEEP for persistent CIN-1 2005.  Options to stop screening versus less frequent  screening intervals reviewed per current screening guidelines.  Will readdress on an annual basis. 4. Colonoscopy 2014.  Repeat at their recommended interval. 5. DEXA 2013 through her primary physician's office.  I have no copies of this.  She is on monthly vitamin D supplement through their office.  She will continue to follow-up with them in reference to bone health. 6. Health maintenance.  No routine lab work done as patient does this elsewhere.  Follow-up 1 year, sooner as needed.   Anastasio Auerbach MD, 10:23 AM 01/25/2018

## 2018-03-22 DIAGNOSIS — R82998 Other abnormal findings in urine: Secondary | ICD-10-CM | POA: Diagnosis not present

## 2018-03-22 DIAGNOSIS — E7849 Other hyperlipidemia: Secondary | ICD-10-CM | POA: Diagnosis not present

## 2018-03-22 DIAGNOSIS — I10 Essential (primary) hypertension: Secondary | ICD-10-CM | POA: Diagnosis not present

## 2018-03-29 DIAGNOSIS — Z23 Encounter for immunization: Secondary | ICD-10-CM | POA: Diagnosis not present

## 2018-03-29 DIAGNOSIS — Z6831 Body mass index (BMI) 31.0-31.9, adult: Secondary | ICD-10-CM | POA: Diagnosis not present

## 2018-03-29 DIAGNOSIS — R05 Cough: Secondary | ICD-10-CM | POA: Diagnosis not present

## 2018-03-29 DIAGNOSIS — N200 Calculus of kidney: Secondary | ICD-10-CM | POA: Diagnosis not present

## 2018-03-29 DIAGNOSIS — I10 Essential (primary) hypertension: Secondary | ICD-10-CM | POA: Diagnosis not present

## 2018-03-29 DIAGNOSIS — Z0000 Encounter for general adult medical examination without abnormal findings: Secondary | ICD-10-CM | POA: Diagnosis not present

## 2018-03-29 DIAGNOSIS — Z1389 Encounter for screening for other disorder: Secondary | ICD-10-CM | POA: Diagnosis not present

## 2018-03-29 DIAGNOSIS — E785 Hyperlipidemia, unspecified: Secondary | ICD-10-CM | POA: Diagnosis not present

## 2018-03-29 DIAGNOSIS — M545 Low back pain: Secondary | ICD-10-CM | POA: Diagnosis not present

## 2018-03-29 DIAGNOSIS — G4733 Obstructive sleep apnea (adult) (pediatric): Secondary | ICD-10-CM | POA: Diagnosis not present

## 2018-04-01 DIAGNOSIS — Z1212 Encounter for screening for malignant neoplasm of rectum: Secondary | ICD-10-CM | POA: Diagnosis not present

## 2018-04-15 DIAGNOSIS — L570 Actinic keratosis: Secondary | ICD-10-CM | POA: Diagnosis not present

## 2018-04-15 DIAGNOSIS — Z85828 Personal history of other malignant neoplasm of skin: Secondary | ICD-10-CM | POA: Diagnosis not present

## 2018-04-15 DIAGNOSIS — D1801 Hemangioma of skin and subcutaneous tissue: Secondary | ICD-10-CM | POA: Diagnosis not present

## 2018-04-15 DIAGNOSIS — L821 Other seborrheic keratosis: Secondary | ICD-10-CM | POA: Diagnosis not present

## 2018-04-15 DIAGNOSIS — D224 Melanocytic nevi of scalp and neck: Secondary | ICD-10-CM | POA: Diagnosis not present

## 2018-04-15 DIAGNOSIS — L820 Inflamed seborrheic keratosis: Secondary | ICD-10-CM | POA: Diagnosis not present

## 2018-04-15 DIAGNOSIS — B078 Other viral warts: Secondary | ICD-10-CM | POA: Diagnosis not present

## 2018-04-15 DIAGNOSIS — D2271 Melanocytic nevi of right lower limb, including hip: Secondary | ICD-10-CM | POA: Diagnosis not present

## 2018-04-15 DIAGNOSIS — L309 Dermatitis, unspecified: Secondary | ICD-10-CM | POA: Diagnosis not present

## 2018-04-15 DIAGNOSIS — D225 Melanocytic nevi of trunk: Secondary | ICD-10-CM | POA: Diagnosis not present

## 2018-04-15 DIAGNOSIS — L814 Other melanin hyperpigmentation: Secondary | ICD-10-CM | POA: Diagnosis not present

## 2018-05-27 DIAGNOSIS — G4733 Obstructive sleep apnea (adult) (pediatric): Secondary | ICD-10-CM | POA: Diagnosis not present

## 2018-05-27 DIAGNOSIS — I10 Essential (primary) hypertension: Secondary | ICD-10-CM | POA: Diagnosis not present

## 2018-05-27 DIAGNOSIS — E669 Obesity, unspecified: Secondary | ICD-10-CM | POA: Diagnosis not present

## 2018-05-27 DIAGNOSIS — Z6831 Body mass index (BMI) 31.0-31.9, adult: Secondary | ICD-10-CM | POA: Diagnosis not present

## 2018-07-01 DIAGNOSIS — I10 Essential (primary) hypertension: Secondary | ICD-10-CM | POA: Diagnosis not present

## 2018-07-01 DIAGNOSIS — E668 Other obesity: Secondary | ICD-10-CM | POA: Diagnosis not present

## 2018-07-01 DIAGNOSIS — Z6830 Body mass index (BMI) 30.0-30.9, adult: Secondary | ICD-10-CM | POA: Diagnosis not present

## 2018-07-01 DIAGNOSIS — G4733 Obstructive sleep apnea (adult) (pediatric): Secondary | ICD-10-CM | POA: Diagnosis not present

## 2018-07-01 DIAGNOSIS — E7849 Other hyperlipidemia: Secondary | ICD-10-CM | POA: Diagnosis not present

## 2018-09-26 IMAGING — US US RENAL
1 series · 13 of 25 positions shown · non-contrast
Comparison: None.

CLINICAL DATA: History of hypertension. Evaluate for renal artery
stenosis.

EXAM:
RENAL/URINARY TRACT ULTRASOUND
RENAL DUPLEX DOPPLER ULTRASOUND

[Series 1: us renal · 0.23mm/px · 13 of 95 slices shown]
[im 1/95]
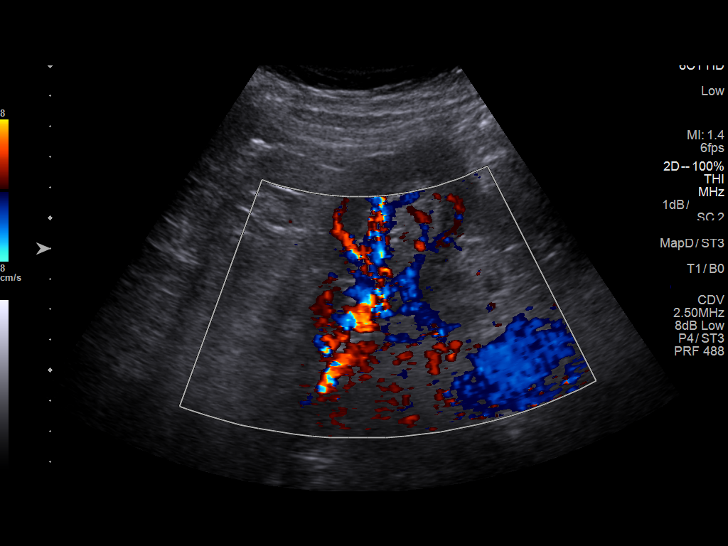
[im 8/95]
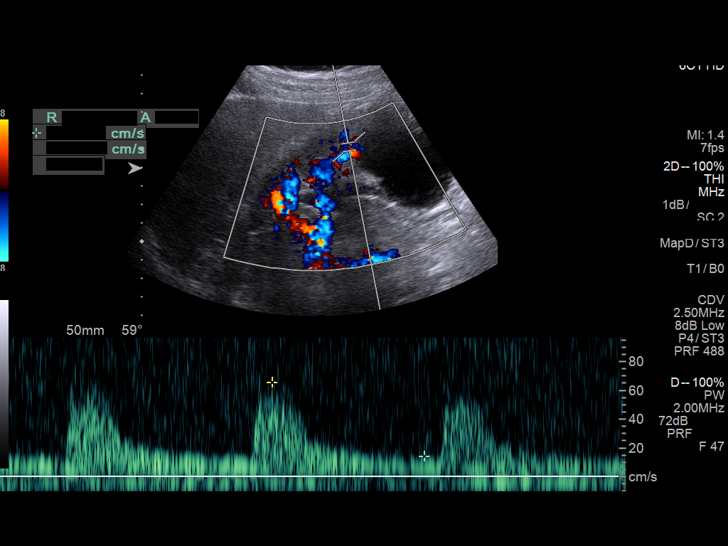
[im 16/95]
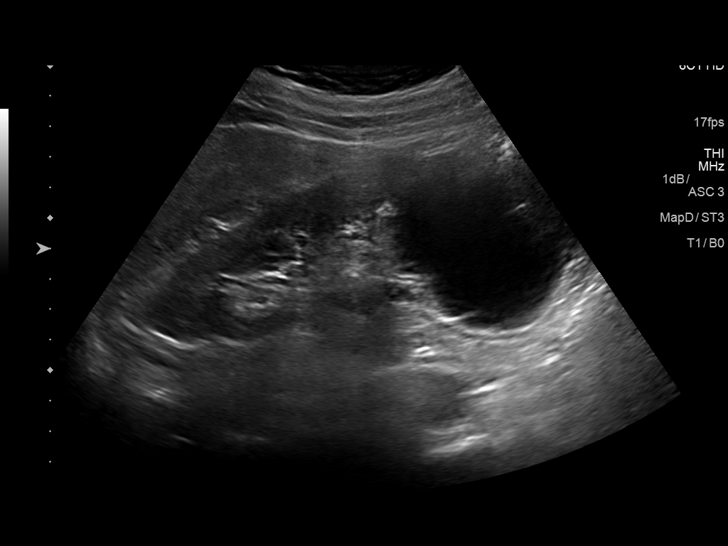
[im 24/95]
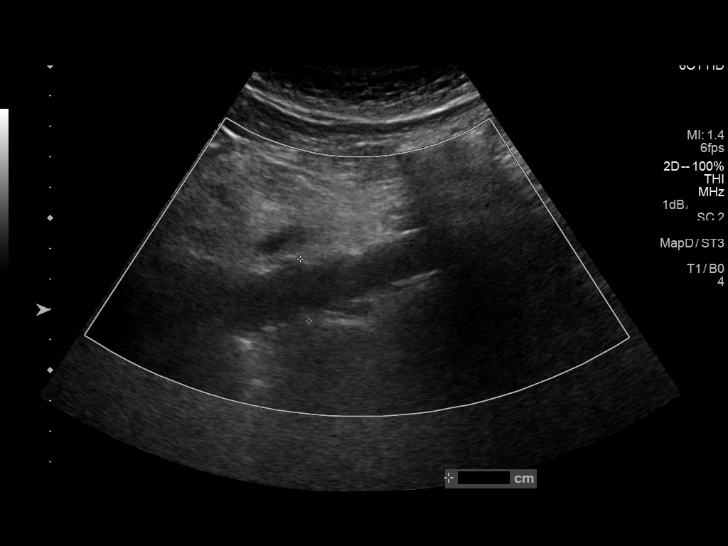
[im 32/95]
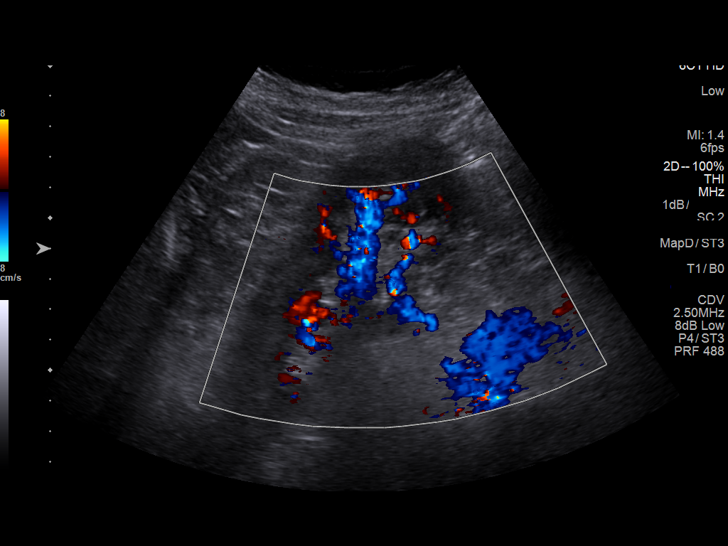
[im 40/95]
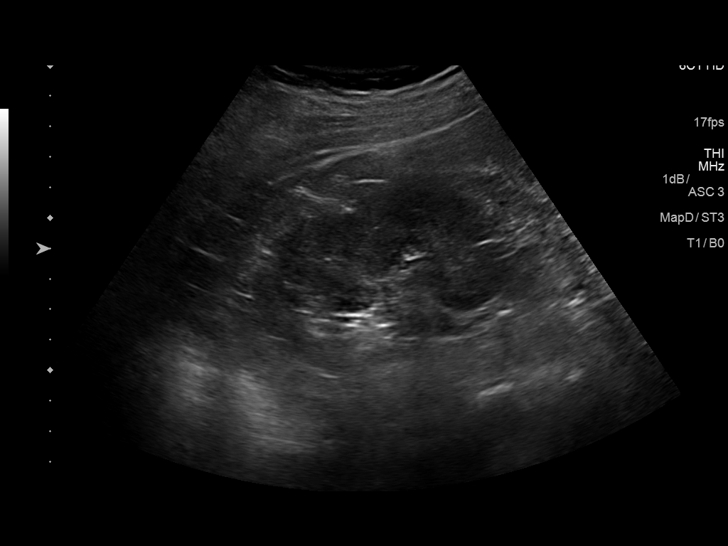
[im 48/95]
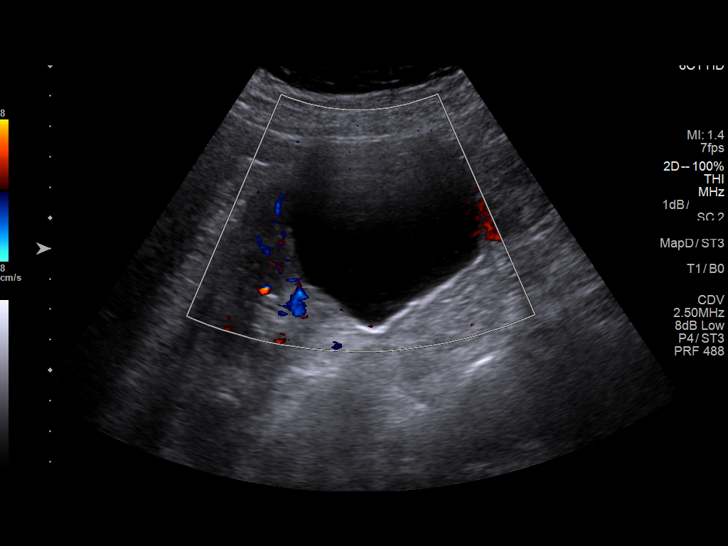
[im 55/95]
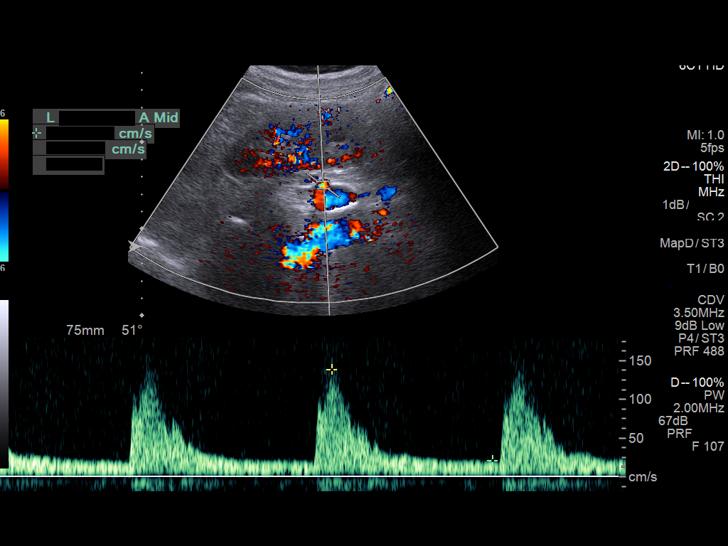
[im 63/95]
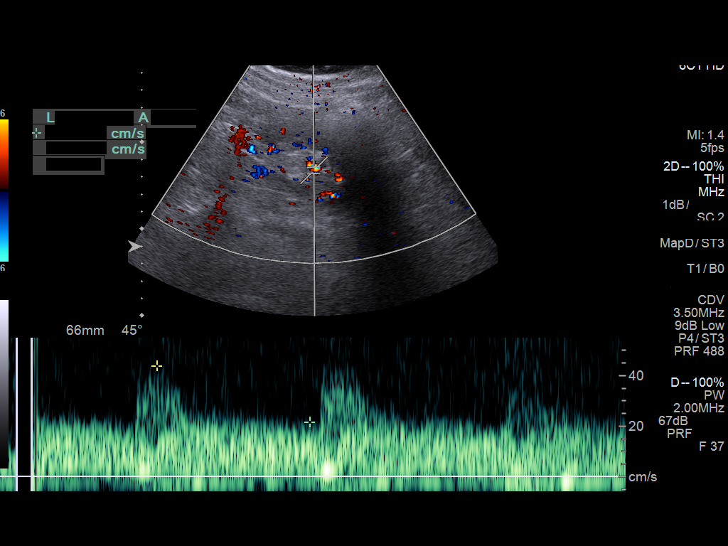
[im 71/95]
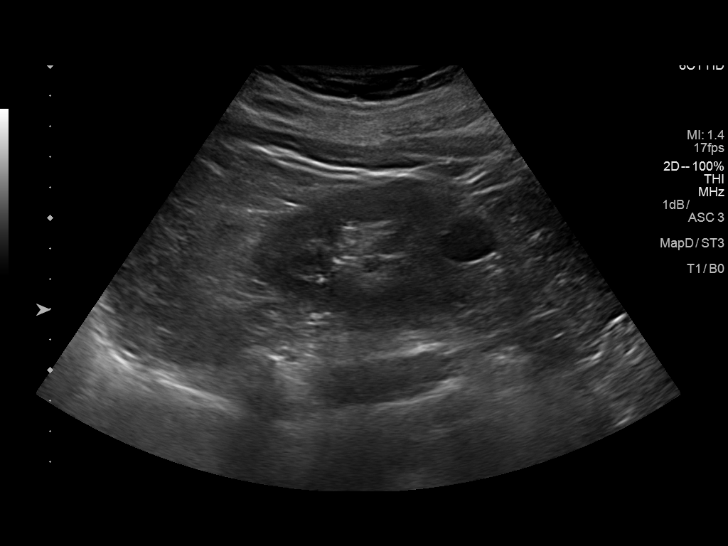
[im 79/95]
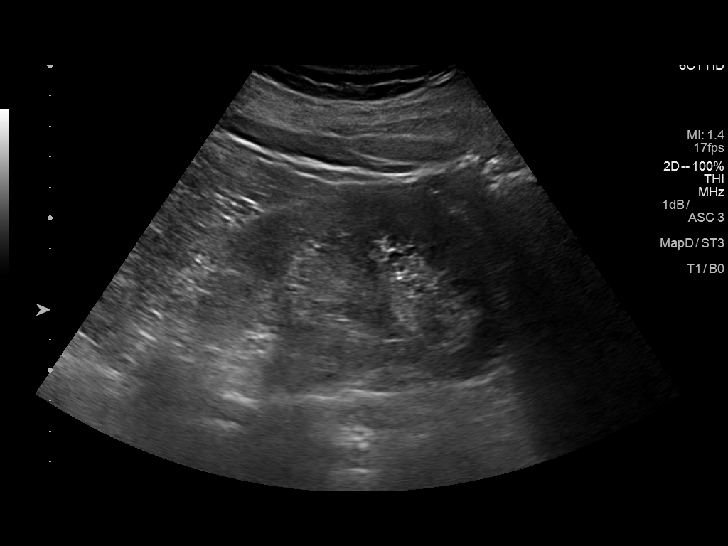
[im 87/95]
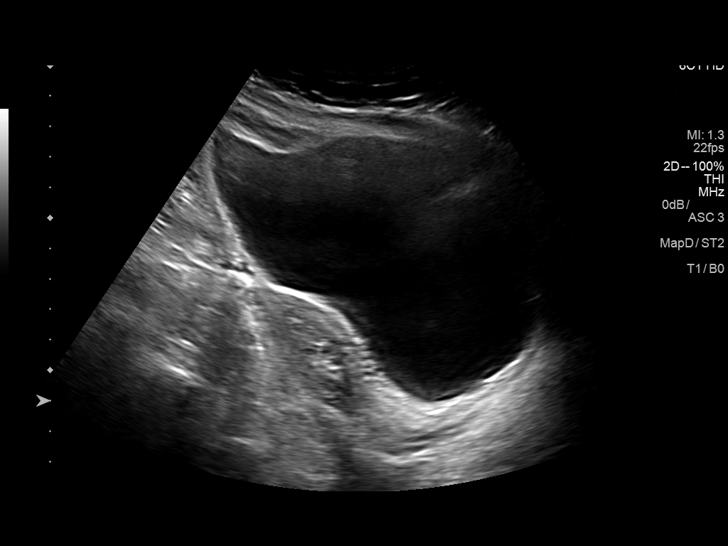
[im 95/95]
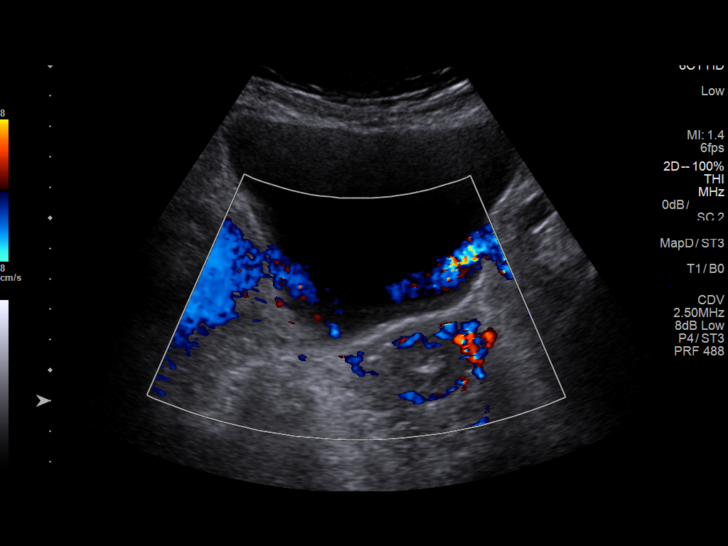

[13 of 25 positions shown; findings below may reference images not displayed]

FINDINGS: Right Kidney:

Normal cortical thickness, echogenicity and size, measuring 11.0 cm
in length. Note is made of an approximately 7.5 x 7.0 x 6.7 cm
anechoic partially exophytic cyst arising from the inferior pole of
the right kidney. Note is also made of an adjacent approximately
x 1.5 cm anechoic cyst within the inferior pole the right kidney
(best seen on the provided cine images). No echogenic renal stones.
No urinary obstruction.

Left Kidney:

Normal cortical thickness, echogenicity and size, measuring 11.7 cm
in length. Note is made of an approximately 2.0 x 1.8 x 1.8 cm
anechoic cyst within the interpolar aspect the left kidney. No
echogenic renal stones. No urinary obstruction.

Bladder:  Appears normal given degree distention.

RENAL DUPLEX ULTRASOUND

Right Renal Artery Velocities:

Origin:  122 cm/sec

Mid:  101 cm/sec

Hilum:  73 cm/sec

Interlobar:  76 cm/sec

Arcuate:  27 cm/sec

Left Renal Artery Velocities:

Origin:  132 cm/sec

Mid:  139 cm/sec

Hilum:  125 cm/sec

Interlobar:  42 cm/sec

Arcuate:  21 cm/sec

Aortic Velocity:  134 cm/sec

Right Renal-Aortic Ratios:

Origin:

Mid:

Hilum:

Interlobar:

Arcuate:

Left Renal-Aortic Ratios:

Origin:

Mid:

Hilum:

Interlobar:

Arcuate:

Normal velocities and low resistance waveforms are demonstrated
throughout the bilateral renal arteries and renal parenchyma.

The bilateral renal veins appear patent where imaged.

The endometrium appears minimally thickened measuring 1.1 cm in
diameter (image 89).
IMPRESSION: 1. No explanation for patient's hypertension. Specifically, no
evidence of renal artery stenosis.
2. Incidentally noted bilateral renal cysts.
3. No evidence of urinary obstruction.
4. **An incidental finding of potential clinical significance has
been found. Apparent endometrial thickening, incompletely evaluated
though an atypical finding in this postmenopausal patient. Further
evaluation dedicated pelvic ultrasound is recommended.**

These results will be called to the ordering clinician or
representative by the Radiologist Assistant, and communication
documented in the PACS or zVision Dashboard.

## 2018-11-16 DIAGNOSIS — H2513 Age-related nuclear cataract, bilateral: Secondary | ICD-10-CM | POA: Diagnosis not present

## 2018-11-16 DIAGNOSIS — H40053 Ocular hypertension, bilateral: Secondary | ICD-10-CM | POA: Diagnosis not present

## 2018-11-16 DIAGNOSIS — H25013 Cortical age-related cataract, bilateral: Secondary | ICD-10-CM | POA: Diagnosis not present

## 2019-01-03 DIAGNOSIS — I10 Essential (primary) hypertension: Secondary | ICD-10-CM | POA: Diagnosis not present

## 2019-01-03 DIAGNOSIS — G4733 Obstructive sleep apnea (adult) (pediatric): Secondary | ICD-10-CM | POA: Diagnosis not present

## 2019-01-03 DIAGNOSIS — E785 Hyperlipidemia, unspecified: Secondary | ICD-10-CM | POA: Diagnosis not present

## 2019-03-15 ENCOUNTER — Encounter: Payer: Self-pay | Admitting: Gynecology

## 2019-03-28 DIAGNOSIS — E7849 Other hyperlipidemia: Secondary | ICD-10-CM | POA: Diagnosis not present

## 2019-03-28 DIAGNOSIS — Z23 Encounter for immunization: Secondary | ICD-10-CM | POA: Diagnosis not present

## 2019-04-04 DIAGNOSIS — N200 Calculus of kidney: Secondary | ICD-10-CM | POA: Diagnosis not present

## 2019-04-04 DIAGNOSIS — Z0000 Encounter for general adult medical examination without abnormal findings: Secondary | ICD-10-CM | POA: Diagnosis not present

## 2019-04-04 DIAGNOSIS — G4733 Obstructive sleep apnea (adult) (pediatric): Secondary | ICD-10-CM | POA: Diagnosis not present

## 2019-04-04 DIAGNOSIS — Z1339 Encounter for screening examination for other mental health and behavioral disorders: Secondary | ICD-10-CM | POA: Diagnosis not present

## 2019-04-04 DIAGNOSIS — E785 Hyperlipidemia, unspecified: Secondary | ICD-10-CM | POA: Diagnosis not present

## 2019-04-04 DIAGNOSIS — R82998 Other abnormal findings in urine: Secondary | ICD-10-CM | POA: Diagnosis not present

## 2019-04-04 DIAGNOSIS — E669 Obesity, unspecified: Secondary | ICD-10-CM | POA: Diagnosis not present

## 2019-04-04 DIAGNOSIS — M545 Low back pain: Secondary | ICD-10-CM | POA: Diagnosis not present

## 2019-04-04 DIAGNOSIS — K219 Gastro-esophageal reflux disease without esophagitis: Secondary | ICD-10-CM | POA: Diagnosis not present

## 2019-04-04 DIAGNOSIS — R202 Paresthesia of skin: Secondary | ICD-10-CM | POA: Diagnosis not present

## 2019-04-04 DIAGNOSIS — I10 Essential (primary) hypertension: Secondary | ICD-10-CM | POA: Diagnosis not present

## 2019-06-28 DIAGNOSIS — D2271 Melanocytic nevi of right lower limb, including hip: Secondary | ICD-10-CM | POA: Diagnosis not present

## 2019-06-28 DIAGNOSIS — C4441 Basal cell carcinoma of skin of scalp and neck: Secondary | ICD-10-CM | POA: Diagnosis not present

## 2019-06-28 DIAGNOSIS — L570 Actinic keratosis: Secondary | ICD-10-CM | POA: Diagnosis not present

## 2019-06-28 DIAGNOSIS — D2262 Melanocytic nevi of left upper limb, including shoulder: Secondary | ICD-10-CM | POA: Diagnosis not present

## 2019-06-28 DIAGNOSIS — L918 Other hypertrophic disorders of the skin: Secondary | ICD-10-CM | POA: Diagnosis not present

## 2019-06-28 DIAGNOSIS — D485 Neoplasm of uncertain behavior of skin: Secondary | ICD-10-CM | POA: Diagnosis not present

## 2019-06-28 DIAGNOSIS — D225 Melanocytic nevi of trunk: Secondary | ICD-10-CM | POA: Diagnosis not present

## 2019-06-28 DIAGNOSIS — L821 Other seborrheic keratosis: Secondary | ICD-10-CM | POA: Diagnosis not present

## 2019-06-28 DIAGNOSIS — D2261 Melanocytic nevi of right upper limb, including shoulder: Secondary | ICD-10-CM | POA: Diagnosis not present

## 2019-06-28 DIAGNOSIS — C44319 Basal cell carcinoma of skin of other parts of face: Secondary | ICD-10-CM | POA: Diagnosis not present

## 2019-06-28 DIAGNOSIS — Z85828 Personal history of other malignant neoplasm of skin: Secondary | ICD-10-CM | POA: Diagnosis not present

## 2019-06-28 DIAGNOSIS — L820 Inflamed seborrheic keratosis: Secondary | ICD-10-CM | POA: Diagnosis not present

## 2019-09-21 ENCOUNTER — Encounter: Payer: Self-pay | Admitting: Obstetrics and Gynecology

## 2019-09-21 DIAGNOSIS — Z1231 Encounter for screening mammogram for malignant neoplasm of breast: Secondary | ICD-10-CM | POA: Diagnosis not present

## 2019-11-22 DIAGNOSIS — J3489 Other specified disorders of nose and nasal sinuses: Secondary | ICD-10-CM | POA: Diagnosis not present

## 2019-11-22 DIAGNOSIS — H9311 Tinnitus, right ear: Secondary | ICD-10-CM | POA: Diagnosis not present

## 2019-11-22 DIAGNOSIS — H6121 Impacted cerumen, right ear: Secondary | ICD-10-CM | POA: Insufficient documentation

## 2019-11-22 DIAGNOSIS — H9313 Tinnitus, bilateral: Secondary | ICD-10-CM | POA: Insufficient documentation

## 2020-03-11 DIAGNOSIS — Z23 Encounter for immunization: Secondary | ICD-10-CM | POA: Diagnosis not present

## 2020-03-18 DIAGNOSIS — Z23 Encounter for immunization: Secondary | ICD-10-CM | POA: Diagnosis not present

## 2020-04-09 DIAGNOSIS — M25562 Pain in left knee: Secondary | ICD-10-CM | POA: Diagnosis not present

## 2020-05-24 DIAGNOSIS — M25562 Pain in left knee: Secondary | ICD-10-CM | POA: Diagnosis not present

## 2020-07-15 DIAGNOSIS — M25562 Pain in left knee: Secondary | ICD-10-CM | POA: Diagnosis not present

## 2020-07-17 DIAGNOSIS — M25562 Pain in left knee: Secondary | ICD-10-CM | POA: Diagnosis not present

## 2020-08-20 DIAGNOSIS — E785 Hyperlipidemia, unspecified: Secondary | ICD-10-CM | POA: Diagnosis not present

## 2020-08-27 DIAGNOSIS — R82998 Other abnormal findings in urine: Secondary | ICD-10-CM | POA: Diagnosis not present

## 2020-08-27 DIAGNOSIS — I10 Essential (primary) hypertension: Secondary | ICD-10-CM | POA: Diagnosis not present

## 2020-08-27 DIAGNOSIS — G4733 Obstructive sleep apnea (adult) (pediatric): Secondary | ICD-10-CM | POA: Diagnosis not present

## 2020-08-27 DIAGNOSIS — Z0000 Encounter for general adult medical examination without abnormal findings: Secondary | ICD-10-CM | POA: Diagnosis not present

## 2020-08-27 DIAGNOSIS — K219 Gastro-esophageal reflux disease without esophagitis: Secondary | ICD-10-CM | POA: Diagnosis not present

## 2020-08-27 DIAGNOSIS — Z1212 Encounter for screening for malignant neoplasm of rectum: Secondary | ICD-10-CM | POA: Diagnosis not present

## 2020-08-27 DIAGNOSIS — N200 Calculus of kidney: Secondary | ICD-10-CM | POA: Diagnosis not present

## 2020-08-27 DIAGNOSIS — E785 Hyperlipidemia, unspecified: Secondary | ICD-10-CM | POA: Diagnosis not present

## 2020-08-27 DIAGNOSIS — E669 Obesity, unspecified: Secondary | ICD-10-CM | POA: Diagnosis not present

## 2020-08-27 DIAGNOSIS — Z1339 Encounter for screening examination for other mental health and behavioral disorders: Secondary | ICD-10-CM | POA: Diagnosis not present

## 2020-08-27 DIAGNOSIS — M1990 Unspecified osteoarthritis, unspecified site: Secondary | ICD-10-CM | POA: Diagnosis not present

## 2020-08-27 DIAGNOSIS — Z1331 Encounter for screening for depression: Secondary | ICD-10-CM | POA: Diagnosis not present

## 2020-09-09 NOTE — Progress Notes (Signed)
75 y.o. G2P2 Widowed Caucasian female here for Pelvic and Breast exam.  Patient complaining of vaginal discharge for 2 - 3 weeks.  Has vaginal dryness.  Denies vaginal itching.  Maybe slight odor.  Used vaginal estrogen in the past.   Does leak urine if she sneezes too hard.  Not often so not bothersome.   No vaginal bleeding or spotting.   Not sexually active.    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   Received her Covid booster.   PCP:  Leanna Battles, MD   No LMP recorded. Patient is postmenopausal.           Sexually active: No.  The current method of family planning is post menopausal status.    Exercising: No.  The patient does not participate in regular exercise at present. Smoker:  no  Health Maintenance: Pap:  11-04-16 Neg, 02-26-14 Neg, 02-18-11 Neg History of abnormal Pap:  Yes, 2005 Hx LEEP--CIN I MMG: 09/2019 normal per patient;appt. 09/2020 with [facility] Colonoscopy: 03-15-13;next 2024 BMD: years ago  Result :normal with PCP TDaP: PCP Gardasil:   no HIV:no Hep C:no Screening Labs:  PCP.   reports that she has never smoked. She has never used smokeless tobacco. She reports current alcohol use. She reports that she does not use drugs.  Past Medical History:  Diagnosis Date  . Arthritis    "wrist, shoulders; its just there "  . Atrophic vaginitis   . CIN I (cervical intraepithelial neoplasia I) 03/2004   LEEP  . Heart murmur    dx in childohood; " i have a small hole in my hart but it doesnt do anything"   . 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 Hx of vertigo    "3 to 4 months of it; but currently no symptoms becasue i used the epley maneuver"   . Hypercholesteremia    cant tolerate statins; just watching diet ; currently on weight watchers  . Hypertension   . Nasal turbinate hypertrophy    dx per Dr. Redmond Baseman ; uses nasal spray for relief   . OSA (obstructive sleep apnea)     initial dx in 2015 as mild; retest 07-09-17 dx with SEVERE OSA ; no current CPAP use, per Neuro Dr Maureen Chatters plan to complete CPAP titration (see 07-19-17 telephone note in epic)  . Other malaise and fatigue 02/28/2014  . Post-operative nausea and vomiting   . Snoring 02/28/2014    Past Surgical History:  Procedure Laterality Date  . ARTHROSCOPY OF KNEE Right    same practice as Dr Maureen Ralphs, [facility]   . CERVICAL BIOPSY  W/ LOOP ELECTRODE EXCISION  2005   done with Dr Deeann Cree ; [place]. same practice as Dr Phineas Real; reports : " i guess it was benign because we didnt have to do anything"   . DILATATION & CURETTAGE/HYSTEROSCOPY WITH MYOSURE N/A 08/25/2017   Procedure: DILATATION & CURETTAGE/HYSTEROSCOPY WITH MYOSURE;  Surgeon: Anastasio Auerbach, MD;  Location: [facility];  Service: Gynecology;  Laterality: N/A;  request to follow around 10:15am in [facility] block time. requests one hour OR time.  . REPLACEMENT TOTAL KNEE Right 2009   dr Wynelle Link     Current Outpatient Medications  Medication Sig Dispense Refill  . amLODipine (NORVASC) 5 MG tablet Take 1 tablet by mouth daily.    Marland Kitchen  amoxicillin (AMOXIL) 500 MG tablet Take 500 mg by mouth as needed (takes 4 tablets one hour before dental appts).    . ergocalciferol (VITAMIN D2) 50000 UNITS capsule Take 50,000 Units by mouth every 30 (thirty) days.    . fluticasone (FLONASE) 50 MCG/ACT nasal spray fluticasone propionate 50 mcg/actuation nasal spray,suspension    . ibuprofen (ADVIL,MOTRIN) 200 MG tablet Take 400 mg by mouth every 6 (six) hours as needed for moderate pain (pain).    Marland Kitchen losartan-hydrochlorothiazide (HYZAAR) 100-12.5 MG tablet Take 1 tablet by mouth daily.    . metoprolol tartrate (LOPRESSOR) 50 MG tablet Take 1 tablet by mouth 2 (two) times daily.    . minoxidil (LONITEN) 2.5 MG tablet minoxidil 2.5 mg tablet    . TURMERIC PO Take by mouth.     No current facility-administered  medications for this visit.    Family History  Problem Relation Age of Onset  . Diabetes Mother   . Hypertension Mother   . Heart disease Mother   . Heart disease Father   . Other Father        circulation problems  . Colon cancer Neg Hx     Review of Systems  All other systems reviewed and are negative.   Exam:   BP (!) 154/70 (Cuff Size: Large)   Pulse 64   Ht 5\' 1"  (1.549 m)   Wt 164 lb (74.4 kg)   SpO2 97%   BMI 30.99 kg/m     General appearance: alert, cooperative and appears stated age Head: normocephalic, without obvious abnormality, atraumatic Lungs: clear to auscultation bilaterally Breasts: normal appearance, no masses or tenderness, No nipple retraction or dimpling, No nipple discharge or bleeding, No axillary adenopathy Heart: regular rate and rhythm.  Systolic murmur.  Abdomen: soft, non-tender; no masses, no organomegaly Extremities: extremities normal, atraumatic, no cyanosis or edema Neurologic: grossly normal  Pelvic: External genitalia:  no lesions              No abnormal inguinal nodes palpated.              Urethra:  normal appearing urethra with no masses, tenderness or lesions              Bartholins and Skenes: normal                 Vagina: atrophy and orange discharge.               Cervix: no lesions              Pap taken: Yes.   Bimanual Exam:  Uterus:  normal size, contour, position, consistency, mobility, non-tender              Adnexa: no mass, fullness, tenderness              Rectal exam: Yes.  .  Confirms.              Anus:  normal sphincter tone, no lesions  Chaperone was present for exam.  Assessment:    Hx LEEP.  Cervical cancer screening today. Hx hysteroscopy with benign polypectomy.  Screening breast exam. Pelvic exam with abnormal findings present. Vaginal atrophy.  Vaginal discharge.  Plan: Mammogram screening discussed. Self breast awareness reviewed. Pap and HR HPV as above. Guidelines for Calcium, Vitamin D,  regular exercise program including cardiovascular and weight bearing exercise. Wet prep: neg yeast, trichomonas, and clue cells.  We discussed water based lubricants, cooking oils, and local  vaginal estrogen treatment.  Rx for estrace cream.  Instructed in used.  I discussed potential effect on breast cancer.  29 min  total time was spent for this patient encounter, including preparation, face-to-face counseling with the patient, coordination of care, and documentation of the encounter.

## 2020-09-10 ENCOUNTER — Encounter: Payer: Self-pay | Admitting: Obstetrics and Gynecology

## 2020-09-10 ENCOUNTER — Other Ambulatory Visit (HOSPITAL_COMMUNITY)
Admission: RE | Admit: 2020-09-10 | Discharge: 2020-09-10 | Disposition: A | Discharge status: Home / Self Care | Payer: Medicare Other | Source: Ambulatory Visit | Attending: Obstetrics and Gynecology | Admitting: Obstetrics and Gynecology

## 2020-09-10 ENCOUNTER — Other Ambulatory Visit: Payer: Self-pay

## 2020-09-10 ENCOUNTER — Ambulatory Visit (INDEPENDENT_AMBULATORY_CARE_PROVIDER_SITE_OTHER): Payer: Medicare Other | Admitting: Obstetrics and Gynecology

## 2020-09-10 VITALS — BP 154/70 | HR 64 | Ht 61.0 in | Wt 164.0 lb

## 2020-09-10 DIAGNOSIS — N898 Other specified noninflammatory disorders of vagina: Secondary | ICD-10-CM

## 2020-09-10 DIAGNOSIS — Z124 Encounter for screening for malignant neoplasm of cervix: Secondary | ICD-10-CM | POA: Insufficient documentation

## 2020-09-10 DIAGNOSIS — Z01411 Encounter for gynecological examination (general) (routine) with abnormal findings: Secondary | ICD-10-CM

## 2020-09-10 DIAGNOSIS — N952 Postmenopausal atrophic vaginitis: Secondary | ICD-10-CM

## 2020-09-10 DIAGNOSIS — Z1239 Encounter for other screening for malignant neoplasm of breast: Secondary | ICD-10-CM | POA: Diagnosis not present

## 2020-09-10 LAB — WET PREP FOR TRICH, YEAST, CLUE

## 2020-09-10 MED ORDER — ESTRADIOL 0.1 MG/GM VA CREA: TOPICAL_CREAM | VAGINAL | 2 refills | Status: DC

## 2020-09-10 NOTE — Patient Instructions (Addendum)
EXERCISE AND DIET:  We recommended that you start or continue a regular exercise program for good health. Regular exercise means any activity that makes your heart beat faster and makes you sweat.  We recommend exercising at least 30 minutes per day at least 3 days a week, preferably 4 or 5.  We also recommend a diet low in fat and sugar.  Inactivity, poor dietary choices and obesity can cause diabetes, heart attack, stroke, and kidney damage, among others.    ALCOHOL AND SMOKING:  Women should limit their alcohol intake to no more than 7 drinks/beers/glasses of wine (combined, not each!) per week. Moderation of alcohol intake to this level decreases your risk of breast cancer and liver damage. And of course, no recreational drugs are part of a healthy lifestyle.  And absolutely no smoking or even second hand smoke. Most people know smoking can cause heart and lung diseases, but did you know it also contributes to weakening of your bones? Aging of your skin?  Yellowing of your teeth and nails?  CALCIUM AND VITAMIN D:  Adequate intake of calcium and Vitamin D are recommended.  The recommendations for exact amounts of these supplements seem to change often, but generally speaking 600 mg of calcium (either carbonate or citrate) and 800 units of Vitamin D per day seems prudent. Certain women may benefit from higher intake of Vitamin D.  If you are among these women, your doctor will have told you during your visit.    PAP SMEARS:  Pap smears, to check for cervical cancer or precancers,  have traditionally been done yearly, although recent scientific advances have shown that most women can have pap smears less often.  However, every woman still should have a physical exam from her gynecologist every year. It will include a breast check, inspection of the vulva and vagina to check for abnormal growths or skin changes, a visual exam of the cervix, and then an exam to evaluate the size and shape of the uterus and  ovaries.  And after 75 years of age, a rectal exam is indicated to check for rectal cancers. We will also provide age appropriate advice regarding health maintenance, like when you should have certain vaccines, screening for sexually transmitted diseases, bone density testing, colonoscopy, mammograms, etc.   MAMMOGRAMS:  All women over 40 years old should have a yearly mammogram. Many facilities now offer a "3D" mammogram, which may cost around $50 extra out of pocket. If possible,  we recommend you accept the option to have the 3D mammogram performed.  It both reduces the number of women who will be called back for extra views which then turn out to be normal, and it is better than the routine mammogram at detecting truly abnormal areas.    COLONOSCOPY:  Colonoscopy to screen for colon cancer is recommended for all women at age 50.  We know, you hate the idea of the prep.  We agree, BUT, having colon cancer and not knowing it is worse!!  Colon cancer so often starts as a polyp that can be seen and removed at colonscopy, which can quite literally save your life!  And if your first colonoscopy is normal and you have no family history of colon cancer, most women don't have to have it again for 10 years.  Once every ten years, you can do something that may end up saving your life, right?  We will be happy to help you get it scheduled when you are ready.    Be sure to check your insurance coverage so you understand how much it will cost.  It may be covered as a preventative service at no cost, but you should check your particular policy.      Atrophic Vaginitis  Atrophic vaginitis is a condition in which the tissues that line the vagina become dry and thin. This condition is most common in women who have stopped having regular menstrual periods (are in menopause). This usually starts when a woman is 34 to 75 years old. That is the time when a woman's estrogen levels begin to decrease. Estrogen is a female  hormone. It helps to keep the tissues of the vagina moist. It stimulates the vagina to produce a clear fluid that lubricates the vagina for sex. This fluid also protects the vagina from infection. Lack of estrogen can cause the lining of the vagina to get thinner and dryer. The vagina may also shrink in size. It may become less elastic. Atrophic vaginitis tends to get worse over time as a woman's estrogen level drops. What are the causes? This condition is caused by the normal drop in estrogen that happens around the time of menopause. What increases the risk? Certain conditions or situations may lower a woman's estrogen level, leading to a higher risk for atrophic vaginitis. You are more likely to develop this condition if:  You are taking medicines that block estrogen.  You have had your ovaries removed.  You are being treated for cancer with radiation or medicines (chemotherapy).  You have given birth or are breastfeeding.  You are older than age 44.  You smoke. What are the signs or symptoms? Symptoms of this condition include:  Pain, soreness, a feeling of pressure, or bleeding during sex (dyspareunia).  Vaginal burning, irritation, or itching.  Pain or bleeding when a speculum is used in a vaginal exam.  Having burning pain while urinating.  Vaginal discharge. In some cases, there are no symptoms. How is this diagnosed? This condition is diagnosed based on your medical history and a physical exam. This will include a pelvic exam that checks the vaginal tissues. Though rare, you may also have other tests, including:  A urine test.  A test that checks the acid balance in your vagina (acid balance test). How is this treated? Treatment for this condition depends on how severe your symptoms are. Treatment may include:  Using an over-the-counter vaginal lubricant before sex.  Using a long-acting vaginal moisturizer.  Using low-dose estrogen for moderate to severe symptoms  that do not respond to other treatments. Options include creams, tablets, and inserts (vaginal rings). Before you use a vaginal estrogen, tell your health care provider if you have a history of: ? Breast cancer. ? Endometrial cancer. ? Blood clots. If you are not sexually active and your symptoms are very mild, you may not need treatment. Follow these instructions at home: Medicines  Take over-the-counter and prescription medicines only as told by your health care provider.  Do not use herbal or alternative medicines unless your health care provider says that you can.  Use over-the-counter creams, lubricants, or moisturizers for dryness only as told by your health care provider. General instructions  If your atrophic vaginitis is caused by menopause, discuss all of your menopause symptoms and treatment options with your health care provider.  Do not douche.  Do not use products that can make your vagina dry. These include: ? Scented feminine sprays. ? Scented tampons. ? Scented soaps.  Vaginal sex can  help to improve blood flow and elasticity of vaginal tissue. If you choose to have sex and it hurts, try using a water-soluble lubricant or moisturizer right before having sex. Contact a health care provider if:  Your discharge looks different than normal.  Your vagina has an unusual smell.  You have new symptoms.  Your symptoms do not improve with treatment.  Your symptoms get worse. Summary  Atrophic vaginitis is a condition in which the tissues that line the vagina become dry and thin. It is most common in women who have stopped having regular menstrual periods (are in menopause).  Treatment options include using vaginal lubricants and low-dose vaginal estrogen.  Contact a health care provider if your vagina has an unusual smell, or if your symptoms get worse or do not improve after treatment. This information is not intended to replace advice given to you by your health  care provider. Make sure you discuss any questions you have with your health care provider. Document Revised: 11/23/2019 Document Reviewed: 11/23/2019 Elsevier Patient Education  [employer].

## 2020-09-12 DIAGNOSIS — L814 Other melanin hyperpigmentation: Secondary | ICD-10-CM | POA: Diagnosis not present

## 2020-09-12 DIAGNOSIS — Z85828 Personal history of other malignant neoplasm of skin: Secondary | ICD-10-CM | POA: Diagnosis not present

## 2020-09-12 DIAGNOSIS — L821 Other seborrheic keratosis: Secondary | ICD-10-CM | POA: Diagnosis not present

## 2020-09-12 DIAGNOSIS — L570 Actinic keratosis: Secondary | ICD-10-CM | POA: Diagnosis not present

## 2020-09-12 DIAGNOSIS — D485 Neoplasm of uncertain behavior of skin: Secondary | ICD-10-CM | POA: Diagnosis not present

## 2020-09-12 DIAGNOSIS — L918 Other hypertrophic disorders of the skin: Secondary | ICD-10-CM | POA: Diagnosis not present

## 2020-09-12 DIAGNOSIS — D225 Melanocytic nevi of trunk: Secondary | ICD-10-CM | POA: Diagnosis not present

## 2020-09-12 DIAGNOSIS — D2271 Melanocytic nevi of right lower limb, including hip: Secondary | ICD-10-CM | POA: Diagnosis not present

## 2020-09-12 LAB — CYTOLOGY - PAP: Diagnosis: NEGATIVE

## 2020-09-26 DIAGNOSIS — Z1231 Encounter for screening mammogram for malignant neoplasm of breast: Secondary | ICD-10-CM | POA: Diagnosis not present

## 2020-11-06 DIAGNOSIS — J343 Hypertrophy of nasal turbinates: Secondary | ICD-10-CM | POA: Diagnosis not present

## 2020-11-06 DIAGNOSIS — G4733 Obstructive sleep apnea (adult) (pediatric): Secondary | ICD-10-CM | POA: Diagnosis not present

## 2020-11-07 ENCOUNTER — Other Ambulatory Visit: Payer: Self-pay | Admitting: Otolaryngology

## 2020-12-05 ENCOUNTER — Other Ambulatory Visit: Payer: Self-pay

## 2020-12-05 ENCOUNTER — Encounter (HOSPITAL_COMMUNITY): Payer: Self-pay | Admitting: Otolaryngology

## 2020-12-05 DIAGNOSIS — E785 Hyperlipidemia, unspecified: Secondary | ICD-10-CM | POA: Diagnosis not present

## 2020-12-05 DIAGNOSIS — I10 Essential (primary) hypertension: Secondary | ICD-10-CM | POA: Diagnosis not present

## 2020-12-05 DIAGNOSIS — G4733 Obstructive sleep apnea (adult) (pediatric): Secondary | ICD-10-CM | POA: Diagnosis not present

## 2020-12-05 DIAGNOSIS — E559 Vitamin D deficiency, unspecified: Secondary | ICD-10-CM | POA: Diagnosis not present

## 2020-12-05 NOTE — Progress Notes (Signed)
No answer/ left voicemail for pt and her 2 sons. Her friend Hoyle Sauer, who stated she will be driving the patient tomorrow. She voiced understanding of new arrival time of 22.

## 2020-12-05 NOTE — Progress Notes (Signed)
Jessica Gibson denies chest pain or shortness of breath. Patient denies any s/s of Covid in her home and is unaware of any exposures.   I instructed patient to shower with antibiotic soap, if it is available.  Dry off with a clean towel. Do not put lotion, powder, cologne or deodorant or makeup.No jewelry or piercings. Man may shave their face and neck. Woman should not shave. No nail polish or artificial nails. Wear clean clothes, brush your teeth. Glasses, contact lens,dentures or partials may not be worn in the OR. If you need to wear them, please bring a case for glasses, do not wear contacts or bring a case, the hospital does not have contact cases, dentures or partials will have to be removed , make sure they are clean, we will provide a denture cup to put them in.

## 2020-12-06 ENCOUNTER — Ambulatory Visit (HOSPITAL_COMMUNITY): Payer: Medicare Other | Admitting: Certified Registered"

## 2020-12-06 ENCOUNTER — Encounter (HOSPITAL_COMMUNITY): Payer: Self-pay | Admitting: Otolaryngology

## 2020-12-06 ENCOUNTER — Ambulatory Visit (HOSPITAL_COMMUNITY)
Admission: RE | Admit: 2020-12-06 | Discharge: 2020-12-06 | Disposition: A | Discharge status: Home / Self Care | Payer: Medicare Other | Attending: Otolaryngology | Admitting: Otolaryngology

## 2020-12-06 ENCOUNTER — Encounter (HOSPITAL_COMMUNITY)
Admission: RE | Disposition: A | Discharge status: Home / Self Care | Payer: Self-pay | Source: Home / Self Care | Attending: Otolaryngology

## 2020-12-06 DIAGNOSIS — Z888 Allergy status to other drugs, medicaments and biological substances status: Secondary | ICD-10-CM | POA: Insufficient documentation

## 2020-12-06 DIAGNOSIS — Z79899 Other long term (current) drug therapy: Secondary | ICD-10-CM | POA: Diagnosis not present

## 2020-12-06 DIAGNOSIS — J343 Hypertrophy of nasal turbinates: Secondary | ICD-10-CM | POA: Insufficient documentation

## 2020-12-06 DIAGNOSIS — G4733 Obstructive sleep apnea (adult) (pediatric): Secondary | ICD-10-CM | POA: Diagnosis not present

## 2020-12-06 DIAGNOSIS — E7800 Pure hypercholesterolemia, unspecified: Secondary | ICD-10-CM | POA: Diagnosis not present

## 2020-12-06 DIAGNOSIS — Z96651 Presence of right artificial knee joint: Secondary | ICD-10-CM | POA: Insufficient documentation

## 2020-12-06 DIAGNOSIS — I10 Essential (primary) hypertension: Secondary | ICD-10-CM | POA: Diagnosis not present

## 2020-12-06 HISTORY — PX: DRUG INDUCED ENDOSCOPY: SHX6808

## 2020-12-06 HISTORY — PX: NASAL TURBINATE REDUCTION: SHX2072

## 2020-12-06 LAB — CBC
HCT: 42.2 % (ref 36.0–46.0)
Hemoglobin: 13.8 g/dL (ref 12.0–15.0)
MCH: 29.2 pg (ref 26.0–34.0)
MCHC: 32.7 g/dL (ref 30.0–36.0)
MCV: 89.4 fL (ref 80.0–100.0)
Platelets: 336 10*3/uL (ref 150–400)
RBC: 4.72 MIL/uL (ref 3.87–5.11)
RDW: 13.4 % (ref 11.5–15.5)
WBC: 6.2 10*3/uL (ref 4.0–10.5)
nRBC: 0 % (ref 0.0–0.2)

## 2020-12-06 LAB — BASIC METABOLIC PANEL
Anion gap: 11 (ref 5–15)
BUN: 15 mg/dL (ref 8–23)
CO2: 24 mmol/L (ref 22–32)
Calcium: 9.9 mg/dL (ref 8.9–10.3)
Chloride: 104 mmol/L (ref 98–111)
Creatinine, Ser: 0.91 mg/dL (ref 0.44–1.00)
GFR, Estimated: >60 mL/min (ref >60)
Glucose, Bld: 105 mg/dL — ABNORMAL HIGH (ref 70–99)
Potassium: 3.8 mmol/L (ref 3.5–5.1)
Sodium: 139 mmol/L (ref 135–145)

## 2020-12-06 SURGERY — DRUG INDUCED SLEEP ENDOSCOPY
Anesthesia: General | Site: Throat

## 2020-12-06 MED ORDER — AMISULPRIDE (ANTIEMETIC) 5 MG/2ML IV SOLN
10.0000 mg | Freq: Once | INTRAVENOUS | Status: DC | PRN
Start: 2020-12-06 — End: 2020-12-06

## 2020-12-06 MED ORDER — PROPOFOL 10 MG/ML IV BOLUS
INTRAVENOUS | Status: DC | PRN
  Administered 2020-12-06 (×4): 10 mg via INTRAVENOUS
  Administered 2020-12-06: 120 mg via INTRAVENOUS
  Administered 2020-12-06 (×3): 10 mg via INTRAVENOUS
  Administered 2020-12-06: 50 mg via INTRAVENOUS
  Administered 2020-12-06: 10 mg via INTRAVENOUS

## 2020-12-06 MED ORDER — EPHEDRINE SULFATE-NACL 50-0.9 MG/10ML-% IV SOSY
PREFILLED_SYRINGE | INTRAVENOUS | Status: DC | PRN
  Administered 2020-12-06 (×2): 10 mg via INTRAVENOUS

## 2020-12-06 MED ORDER — OXYMETAZOLINE HCL 0.05 % NA SOLN
NASAL | Status: AC
  Filled 2020-12-06: qty 30

## 2020-12-06 MED ORDER — OXYMETAZOLINE HCL 0.05 % NA SOLN
NASAL | Status: DC | PRN
  Administered 2020-12-06: 1

## 2020-12-06 MED ORDER — CEFAZOLIN SODIUM-DEXTROSE 2-4 GM/100ML-% IV SOLN
2.0000 g | INTRAVENOUS | Status: AC
  Administered 2020-12-06: 2 g via INTRAVENOUS

## 2020-12-06 MED ORDER — PROPOFOL 500 MG/50ML IV EMUL
INTRAVENOUS | Status: DC | PRN
  Administered 2020-12-06: 50 ug/kg/min via INTRAVENOUS

## 2020-12-06 MED ORDER — CHLORHEXIDINE GLUCONATE 0.12 % MT SOLN
OROMUCOSAL | Status: AC
  Administered 2020-12-06: 15 mL via OROMUCOSAL
  Filled 2020-12-06: qty 15

## 2020-12-06 MED ORDER — LIDOCAINE-EPINEPHRINE 1 %-1:100000 IJ SOLN
INTRAMUSCULAR | Status: AC
  Filled 2020-12-06: qty 1

## 2020-12-06 MED ORDER — DEXAMETHASONE SODIUM PHOSPHATE 10 MG/ML IJ SOLN
INTRAMUSCULAR | Status: DC | PRN
  Administered 2020-12-06: 10 mg via INTRAVENOUS

## 2020-12-06 MED ORDER — ROCURONIUM BROMIDE 100 MG/10ML IV SOLN: INTRAVENOUS | Status: DC | PRN

## 2020-12-06 MED ORDER — ROCURONIUM BROMIDE 10 MG/ML (PF) SYRINGE
PREFILLED_SYRINGE | INTRAVENOUS | Status: DC | PRN
  Administered 2020-12-06: 50 mg via INTRAVENOUS

## 2020-12-06 MED ORDER — FENTANYL CITRATE (PF) 250 MCG/5ML IJ SOLN
INTRAMUSCULAR | Status: AC
  Filled 2020-12-06: qty 5

## 2020-12-06 MED ORDER — EPHEDRINE 5 MG/ML INJ
INTRAVENOUS | Status: AC
  Filled 2020-12-06: qty 10

## 2020-12-06 MED ORDER — CHLORHEXIDINE GLUCONATE 0.12 % MT SOLN: 15.0000 mL | Freq: Once | OROMUCOSAL | Status: AC

## 2020-12-06 MED ORDER — SUGAMMADEX SODIUM 200 MG/2ML IV SOLN
INTRAVENOUS | Status: DC | PRN
  Administered 2020-12-06: 200 mg via INTRAVENOUS

## 2020-12-06 MED ORDER — LIDOCAINE-EPINEPHRINE 1 %-1:100000 IJ SOLN
INTRAMUSCULAR | Status: DC | PRN
  Administered 2020-12-06: 6 mL

## 2020-12-06 MED ORDER — LIDOCAINE 2% (20 MG/ML) 5 ML SYRINGE
INTRAMUSCULAR | Status: AC
  Filled 2020-12-06: qty 10

## 2020-12-06 MED ORDER — CEFAZOLIN SODIUM-DEXTROSE 2-4 GM/100ML-% IV SOLN
INTRAVENOUS | Status: AC
  Filled 2020-12-06: qty 100

## 2020-12-06 MED ORDER — FENTANYL CITRATE (PF) 100 MCG/2ML IJ SOLN: 25.0000 ug | INTRAMUSCULAR | Status: DC | PRN

## 2020-12-06 MED ORDER — FENTANYL CITRATE (PF) 250 MCG/5ML IJ SOLN
INTRAMUSCULAR | Status: DC | PRN
  Administered 2020-12-06 (×2): 50 ug via INTRAVENOUS

## 2020-12-06 MED ORDER — ONDANSETRON HCL 4 MG/2ML IJ SOLN
INTRAMUSCULAR | Status: DC | PRN
  Administered 2020-12-06: 4 mg via INTRAVENOUS

## 2020-12-06 MED ORDER — LACTATED RINGERS IV SOLN: INTRAVENOUS | Status: DC

## 2020-12-06 MED ORDER — ORAL CARE MOUTH RINSE: 15.0000 mL | Freq: Once | OROMUCOSAL | Status: AC

## 2020-12-06 MED ORDER — 0.9 % SODIUM CHLORIDE (POUR BTL) OPTIME
TOPICAL | Status: DC | PRN
  Administered 2020-12-06: 1000 mL

## 2020-12-06 SURGICAL SUPPLY — 34 items
BAG COUNTER SPONGE SURGICOUNT (BAG) ×3 IMPLANT
BAG SURGICOUNT SPONGE COUNTING (BAG) ×1
BLADE INF TURB ROT M4 2 5PK (BLADE) ×3 IMPLANT
BLADE INF TURB ROT M4 2MM 5PK (BLADE) ×1
BLADE SURG 15 STRL LF DISP TIS (BLADE) IMPLANT
BLADE SURG 15 STRL SS (BLADE)
CANISTER SUCT 1200ML W/VALVE (MISCELLANEOUS) ×4 IMPLANT
CANISTER SUCT 3000ML PPV (MISCELLANEOUS) ×8 IMPLANT
COAGULATOR SUCT 6 FR SWTCH (ELECTROSURGICAL)
COAGULATOR SUCT SWTCH 10FR 6 (ELECTROSURGICAL) IMPLANT
DRAPE HALF SHEET 40X57 (DRAPES) IMPLANT
DRSG TELFA 3X8 NADH (GAUZE/BANDAGES/DRESSINGS) IMPLANT
ELECT REM PT RETURN 9FT ADLT (ELECTROSURGICAL) ×4
ELECTRODE REM PT RTRN 9FT ADLT (ELECTROSURGICAL) ×2 IMPLANT
GLOVE SURG ENC MOIS LTX SZ7.5 (GLOVE) ×8 IMPLANT
GOWN STRL REUS W/ TWL LRG LVL3 (GOWN DISPOSABLE) ×4 IMPLANT
GOWN STRL REUS W/TWL LRG LVL3 (GOWN DISPOSABLE) ×8
KIT BASIN OR (CUSTOM PROCEDURE TRAY) ×4 IMPLANT
KIT TURNOVER KIT B (KITS) ×4 IMPLANT
NEEDLE HYPO 25GX1X1/2 BEV (NEEDLE) ×4 IMPLANT
NEEDLE PRECISIONGLIDE 27X1.5 (NEEDLE) IMPLANT
NS IRRIG 1000ML POUR BTL (IV SOLUTION) ×4 IMPLANT
PAD ARMBOARD 7.5X6 YLW CONV (MISCELLANEOUS) ×8 IMPLANT
PATTIES SURGICAL .5 X3 (DISPOSABLE) ×4 IMPLANT
POSITIONER HEAD DONUT 9IN (MISCELLANEOUS) ×4 IMPLANT
SHEET MEDIUM DRAPE 40X70 STRL (DRAPES) IMPLANT
SOL ANTI FOG 6CC (MISCELLANEOUS) ×2 IMPLANT
SOLUTION ANTI FOG 6CC (MISCELLANEOUS) ×2
SYR CONTROL 10ML LL (SYRINGE) IMPLANT
TOWEL GREEN STERILE FF (TOWEL DISPOSABLE) ×4 IMPLANT
TRAY ENT MC OR (CUSTOM PROCEDURE TRAY) ×4 IMPLANT
TUBE CONNECTING 20'X1/4 (TUBING) ×1
TUBE CONNECTING 20X1/4 (TUBING) ×3 IMPLANT
WATER STERILE IRR 1000ML POUR (IV SOLUTION) ×4 IMPLANT

## 2020-12-06 NOTE — H&P (Signed)
Jessica Gibson is an 75 y.o. female.   Chief Complaint: Sleep apnea and nasal obstruction HPI: 75 year old female with obstructive sleep apnea who has had difficulty tolerating CPAP and also who has problems with nasal obstruction not responding to medical management.  Past Medical History:  Diagnosis Date   Arthritis    "wrist, shoulders; its just there "   Atrophic vaginitis    CIN I (cervical intraepithelial neoplasia I) 03/2004   LEEP   Heart murmur    dx in childohood; " i have a small hole in my hart but it doesnt do anything"    History of kidney stones    "i had 1 kidney at age 67 and at that time they went up, put a basket and retrieved "    Hx of vertigo    "3 to 4 months of it; but currently no symptoms becasue i used the epley maneuver"    Hypercholesteremia    cant tolerate statins; just watching diet ; currently on weight watchers   Hypertension    Nasal turbinate hypertrophy    dx per Dr. Redmond Baseman ; uses nasal spray for relief    OSA (obstructive sleep apnea)    initial dx in 2015 as mild; retest 07-09-17 dx with SEVERE OSA ; no current CPAP use, per Neuro Dr Maureen Chatters plan to complete CPAP titration (see 07-19-17 telephone note in epic)   Other malaise and fatigue 02/28/2014   Post-operative nausea and vomiting    Snoring 02/28/2014    Past Surgical History:  Procedure Laterality Date   ARTHROSCOPY OF KNEE Right    same practice as Dr [facility]    CERVICAL BIOPSY  W/ LOOP ELECTRODE EXCISION  2005   done with Dr Deeann Cree ; [place]. same practice as Dr Phineas Real; reports : " i guess it was benign because we didnt have to do anything"    [place] N/A 08/25/2017   Procedure: [employer];  Surgeon: Anastasio Auerbach, MD;  Location: [facility];  Service: Gynecology;  Laterality: N/A;  request to follow around 10:15am in [facility] block  time. requests one hour OR time.   REPLACEMENT TOTAL KNEE Right 2009   dr Wynelle Link     Family History  Problem Relation Age of Onset   Diabetes Mother    Hypertension Mother    Heart disease Mother    Heart disease Father    Other Father        circulation problems   Colon cancer Neg Hx    Social History:  reports that she has never smoked. She has never used smokeless tobacco. She reports current alcohol use. She reports that she does not use drugs.  Allergies:  Allergies  Allergen Reactions   Ezetimibe Other (See Comments)    Joint pain   Labetalol Hcl Other (See Comments)    Took way taste   Other Other (See Comments)   Pravastatin Other (See Comments)    Joint pain    Medications Prior to Admission  Medication Sig Dispense Refill   acetaminophen (TYLENOL) 650 MG CR tablet Take 650 mg by mouth every 8 (eight) hours as needed for pain.     amLODipine (NORVASC) 5 MG tablet Take 5 mg by mouth daily.     estradiol (ESTRACE) 0.1 MG/GM vaginal cream Use 1/2 g vaginally every night for the first 2 weeks,  then use 1/2 g vaginally two or three times per week as needed to maintain symptom relief. (Patient taking differently: Place 0.5 g vaginally See admin instructions. Use 0.5 g vaginally every night for the first 2 weeks, then use 1/2 g vaginally two or three times per week as needed to maintain symptom relief.) 42.5 g 2   ibuprofen (ADVIL,MOTRIN) 200 MG tablet Take 400 mg by mouth every 6 (six) hours as needed for moderate pain (pain).     losartan-hydrochlorothiazide (HYZAAR) 100-12.5 MG tablet Take 1 tablet by mouth daily.     metoprolol tartrate (LOPRESSOR) 50 MG tablet Take 50 mg by mouth 2 (two) times daily.     minoxidil (LONITEN) 2.5 MG tablet Take 10 mg by mouth 2 (two) times daily.     amoxicillin (AMOXIL) 500 MG tablet Take 500 mg by mouth as needed (takes 4 tablets one hour before dental appts).      Results for orders placed or performed during the hospital encounter  of 12/06/20 (from the past 48 hour(s))  CBC per protocol     Status: None   Collection Time: 12/06/20  8:04 AM  Result Value Ref Range   WBC 6.2 4.0 - 10.5 K/uL   RBC 4.72 3.87 - 5.11 MIL/uL   Hemoglobin 13.8 12.0 - 15.0 g/dL   HCT 42.2 36.0 - 46.0 %   MCV 89.4 80.0 - 100.0 fL   MCH 29.2 26.0 - 34.0 pg   MCHC 32.7 30.0 - 36.0 g/dL   RDW 13.4 11.5 - 15.5 %   Platelets 336 150 - 400 K/uL   nRBC 0.0 0.0 - 0.2 %    Comment: Performed at [facility], [place] [address] [place], [place] [address]  Basic metabolic panel per protocol     Status: Abnormal   Collection Time: 12/06/20  8:04 AM  Result Value Ref Range   Sodium 139 135 - 145 mmol/L   Potassium 3.8 3.5 - 5.1 mmol/L   Chloride 104 98 - 111 mmol/L   CO2 24 22 - 32 mmol/L   Glucose, Bld 105 (H) 70 - 99 mg/dL    Comment: Glucose reference range applies only to samples taken after fasting for at least 8 hours.   BUN 15 8 - 23 mg/dL   Creatinine, Ser 0.91 0.44 - 1.00 mg/dL   Calcium 9.9 8.9 - 10.3 mg/dL   GFR, Estimated >60 >60 mL/min    Comment: (NOTE) Calculated using the CKD-EPI Creatinine Equation (2021)    Anion gap 11 5 - 15    Comment: Performed at [facility] [address], [place], [place] [address]   No results found.  Review of Systems  All other systems reviewed and are negative.  Blood pressure (!) 179/57, pulse 62, temperature 98.6 F (37 C), temperature source Oral, resp. rate 17, height 5\' 1"  (1.549 m), weight 73.5 kg, SpO2 96 %. Physical Exam Constitutional:      Appearance: Normal appearance. She is normal weight.  HENT:     Head: Normocephalic and atraumatic.     Right Ear: External ear normal.     Left Ear: External ear normal.     Nose: Nose normal.     Mouth/Throat:     Mouth: Mucous membranes are moist.     Pharynx: Oropharynx is clear.  Eyes:     Extraocular Movements: Extraocular movements intact.     Conjunctiva/sclera: Conjunctivae normal.     Pupils: Pupils are equal, round,  and reactive to light.  Cardiovascular:     Rate and Rhythm: Normal rate.  Pulmonary:     Effort: Pulmonary effort is normal.  Musculoskeletal:     Cervical back: Normal range of motion.  Skin:    General: Skin is warm and dry.  Neurological:     General: No focal deficit present.     Mental Status: She is alert and oriented to person, place, and time.  Psychiatric:        Mood and Affect: Mood normal.        Behavior: Behavior normal.        Thought Content: Thought content normal.        Judgment: Judgment normal.     Assessment/Plan Obstructive sleep apnea and turbinate hypertrophy  To OR for DISE and turbinate reduction.  Melida Quitter, MD 12/06/2020, 10:17 AM

## 2020-12-06 NOTE — Transfer of Care (Signed)
Immediate Anesthesia Transfer of Care Note  Patient: Jessica Gibson  Procedure(s) Performed: DRUG INDUCED ENDOSCOPY (Throat) TURBINATE REDUCTION/SUBMUCOSAL RESECTION (Bilateral: Nose)  Patient Location: PACU  Anesthesia Type:General  Level of Consciousness: awake, alert  and oriented  Airway & Oxygen Therapy: Patient Spontanous Breathing and Patient connected to face mask oxygen  Post-op Assessment: Report given to RN and Post -op Vital signs reviewed and stable  Post vital signs: Reviewed and stable  Last Vitals:  Vitals Value Taken Time  BP    Temp    Pulse 56 12/06/20 1148  Resp 13 12/06/20 1148  SpO2 100 % 12/06/20 1148  Vitals shown include unvalidated device data.  Last Pain:  Vitals:   12/06/20 0833  TempSrc:   PainSc: 0-No pain      Patients Stated Pain Goal: 3 (14/10/30 1314)  Complications: No notable events documented.

## 2020-12-06 NOTE — Op Note (Signed)
Preop diagnosis: Obstructive sleep apnea, turbinate hypertrophy Postop diagnosis: same Procedure: Drug-induced sleep endoscopy and bilateral turbinate reduction Surgeon: Redmond Baseman Anesth: IV sedation followed by general endotracheal anesthesia Compl: None Findings: There is 90% anterior-posterior collapse at the velum making her a candidate for hypoglossal nerve stimulator placement.  There was not much collapse at the tongue base.  Turbinates are enlarged. Description:  After discussing risks, benefits, and alternatives, the patient was brought to the operative suite and placed on the operative table in the supine position.  Anesthesia was induced and the patient was given light sedation to simulate natural sleep. When the proper level was reached, an Afrin-soaked pledget was placed in the right nasal passage for a couple of minutes and then removed.  The fiberoptic laryngoscope was then passed to view the pharynx and larynx.  Findings are noted above and the exam was recorded.  After completion, the scope was removed.  General anesthesia was induced and the patient was intubated by the anesthesia team without difficulty.  The eyes were taped closed.  The patient was given intravenous antibiotics during the case.  The nose was packed in both sides with Afrin-soaked pledgets for several minutes that were then removed.  The inferior turbinates were injected with local anesthetic.  An incision was made at the anterior head of each inferior turbinate and soft tissues were elevated off of the underlying bone using a freer elevator.  The microdebrider was then used with the turbinate blade to remove submucosal tissue keeping the overlying mucosa and underlying bone intact.  Soft tissues were then redraped and the turbinates out-fractured.  The nasal passage was suctioned.  Pledgets were placed back in the nose for wake-up.  Drapes were removed and the patient was cleaned off.  The patient was returned to anesthesia  for wakeup, extubated, and taken to the recovery room in stable condition.

## 2020-12-06 NOTE — Anesthesia Procedure Notes (Signed)
Procedure Name: Intubation Date/Time: 12/06/2020 11:15 AM Performed by: Lavonia Dana, CRNA Pre-anesthesia Checklist: Patient identified, Emergency Drugs available, Suction available and Patient being monitored Patient Re-evaluated:Patient Re-evaluated prior to induction Oxygen Delivery Method: Circle system utilized Preoxygenation: Pre-oxygenation with 100% oxygen Induction Type: IV induction Ventilation: Mask ventilation without difficulty and Oral airway inserted - appropriate to patient size Laryngoscope Size: Miller and 2 Grade View: Grade I Tube type: Oral Tube size: 7.0 mm Number of attempts: 1 Airway Equipment and Method: Stylet and Oral airway Placement Confirmation: ETT inserted through vocal cords under direct vision, positive ETCO2 and breath sounds checked- equal and bilateral Secured at: 22 cm Tube secured with: Tape Dental Injury: Teeth and Oropharynx as per pre-operative assessment  Comments: DL x1 with Mac 3, grade III view; DL x1 with Miller 2, grade I view

## 2020-12-06 NOTE — Brief Op Note (Signed)
12/06/2020  11:31 AM  PATIENT:  [name]  75 y.o. female  PRE-OPERATIVE DIAGNOSIS:  Obstrucive Sleep Apnea Nasal Turbinate Hypertrophy  POST-OPERATIVE DIAGNOSIS:  Obstrucive Sleep Apnea Nasal Turbinate Hypertrophy  PROCEDURE:  Procedure(s): DRUG INDUCED ENDOSCOPY (N/A) TURBINATE REDUCTION/SUBMUCOSAL RESECTION (Bilateral)  SURGEON:  Surgeon(s) and Role:    Melida Quitter, MD - Primary  PHYSICIAN ASSISTANT:   ASSISTANTS: none   ANESTHESIA:   IV sedation then general  EBL:  Minimal  BLOOD ADMINISTERED:none  DRAINS: none   LOCAL MEDICATIONS USED:  LIDOCAINE   SPECIMEN:  No Specimen  DISPOSITION OF SPECIMEN:  N/A  COUNTS:  YES  TOURNIQUET:  * No tourniquets in log *  DICTATION: .Note written in EPIC  PLAN OF CARE: Discharge to home after PACU  PATIENT DISPOSITION:  PACU - hemodynamically stable.   Delay start of Pharmacological VTE agent (>24hrs) due to surgical blood loss or risk of bleeding: yes

## 2020-12-06 NOTE — Anesthesia Preprocedure Evaluation (Signed)
Anesthesia Evaluation  Patient identified by MRN, date of birth, ID band Patient awake    Reviewed: Allergy & Precautions, NPO status , Patient's Chart, lab work & pertinent test results  History of Anesthesia Complications (+) PONV  Airway Mallampati: II  TM Distance: >3 FB Neck ROM: Full    Dental  (+) Dental Advisory Given   Pulmonary sleep apnea ,    breath sounds clear to auscultation       Cardiovascular hypertension, Pt. on medications and Pt. on home beta blockers  Rhythm:Regular Rate:Normal     Neuro/Psych    GI/Hepatic negative GI ROS, Neg liver ROS,   Endo/Other  negative endocrine ROS  Renal/GU negative Renal ROS     Musculoskeletal  (+) Arthritis ,   Abdominal   Peds  Hematology negative hematology ROS (+)   Anesthesia Other Findings   Reproductive/Obstetrics                             Lab Results  Component Value Date   WBC 6.2 12/06/2020   HGB 13.8 12/06/2020   HCT 42.2 12/06/2020   MCV 89.4 12/06/2020   PLT 336 12/06/2020   Lab Results  Component Value Date   CREATININE 0.91 12/06/2020   BUN 15 12/06/2020   NA 139 12/06/2020   K 3.8 12/06/2020   CL 104 12/06/2020   CO2 24 12/06/2020    Anesthesia Physical Anesthesia Plan  ASA: 2  Anesthesia Plan: General   Post-op Pain Management:    Induction: Intravenous  PONV Risk Score and Plan: 3 and Ondansetron, Dexamethasone, Treatment may vary due to age or medical condition and Propofol infusion  Airway Management Planned: Oral ETT  Additional Equipment: None  Intra-op Plan:   Post-operative Plan: Extubation in OR  Informed Consent: I have reviewed the patients History and Physical, chart, labs and discussed the procedure including the risks, benefits and alternatives for the proposed anesthesia with the patient or authorized representative who has indicated his/her understanding and acceptance.      Dental advisory given  Plan Discussed with: CRNA  Anesthesia Plan Comments:         Anesthesia Quick Evaluation

## 2020-12-10 ENCOUNTER — Encounter (HOSPITAL_COMMUNITY): Payer: Self-pay | Admitting: Otolaryngology

## 2020-12-10 NOTE — Anesthesia Postprocedure Evaluation (Signed)
Anesthesia Post Note  Patient: Jessica Gibson  Procedure(s) Performed: DRUG INDUCED ENDOSCOPY (Throat) TURBINATE REDUCTION/SUBMUCOSAL RESECTION (Bilateral: Nose)     Patient location during evaluation: PACU Anesthesia Type: General Level of consciousness: awake and alert Pain management: pain level controlled Vital Signs Assessment: post-procedure vital signs reviewed and stable Respiratory status: spontaneous breathing, nonlabored ventilation, respiratory function stable and patient connected to nasal cannula oxygen Cardiovascular status: blood pressure returned to baseline and stable Postop Assessment: no apparent nausea or vomiting Anesthetic complications: no   No notable events documented.  Last Vitals:  Vitals:   12/06/20 1200 12/06/20 1215  BP: (!) 129/50 (!) 129/55  Pulse: 61 60  Resp: 15 16  Temp:  (!) 36.3 C  SpO2: 96% 96%    Last Pain:  Vitals:   12/06/20 1145  TempSrc:   PainSc: 3                  Tiajuana Amass

## 2020-12-26 DIAGNOSIS — I10 Essential (primary) hypertension: Secondary | ICD-10-CM | POA: Diagnosis not present

## 2021-01-02 DIAGNOSIS — I10 Essential (primary) hypertension: Secondary | ICD-10-CM | POA: Diagnosis not present

## 2021-01-23 DIAGNOSIS — H2513 Age-related nuclear cataract, bilateral: Secondary | ICD-10-CM | POA: Diagnosis not present

## 2021-01-23 DIAGNOSIS — H5203 Hypermetropia, bilateral: Secondary | ICD-10-CM | POA: Diagnosis not present

## 2021-01-23 DIAGNOSIS — H40053 Ocular hypertension, bilateral: Secondary | ICD-10-CM | POA: Diagnosis not present

## 2021-02-14 DIAGNOSIS — G4733 Obstructive sleep apnea (adult) (pediatric): Secondary | ICD-10-CM | POA: Diagnosis not present

## 2021-02-14 DIAGNOSIS — E785 Hyperlipidemia, unspecified: Secondary | ICD-10-CM | POA: Diagnosis not present

## 2021-02-14 DIAGNOSIS — Z23 Encounter for immunization: Secondary | ICD-10-CM | POA: Diagnosis not present

## 2021-02-14 DIAGNOSIS — T466X5A Adverse effect of antihyperlipidemic and antiarteriosclerotic drugs, initial encounter: Secondary | ICD-10-CM | POA: Diagnosis not present

## 2021-02-14 DIAGNOSIS — I10 Essential (primary) hypertension: Secondary | ICD-10-CM | POA: Diagnosis not present

## 2021-03-17 DIAGNOSIS — Z23 Encounter for immunization: Secondary | ICD-10-CM | POA: Diagnosis not present

## 2021-04-23 ENCOUNTER — Encounter: Payer: Self-pay | Admitting: Neurology

## 2021-04-23 ENCOUNTER — Ambulatory Visit (INDEPENDENT_AMBULATORY_CARE_PROVIDER_SITE_OTHER): Payer: Medicare Other | Admitting: Neurology

## 2021-04-23 VITALS — BP 153/71 | HR 62 | Ht 62.0 in | Wt 160.5 lb

## 2021-04-23 DIAGNOSIS — G4730 Sleep apnea, unspecified: Secondary | ICD-10-CM

## 2021-04-23 DIAGNOSIS — Z789 Other specified health status: Secondary | ICD-10-CM | POA: Diagnosis not present

## 2021-04-23 DIAGNOSIS — Z9889 Other specified postprocedural states: Secondary | ICD-10-CM | POA: Diagnosis not present

## 2021-04-23 DIAGNOSIS — G478 Other sleep disorders: Secondary | ICD-10-CM

## 2021-04-23 DIAGNOSIS — G4733 Obstructive sleep apnea (adult) (pediatric): Secondary | ICD-10-CM | POA: Diagnosis not present

## 2021-04-23 DIAGNOSIS — J300 Vasomotor rhinitis: Secondary | ICD-10-CM

## 2021-04-23 DIAGNOSIS — G4700 Insomnia, unspecified: Secondary | ICD-10-CM | POA: Diagnosis not present

## 2021-04-23 NOTE — Progress Notes (Signed)
SLEEP MEDICINE CLINIC    Provider:  Larey Seat, MD  Primary Care Physician:  Leanna Battles, MD [place] [address]     Referring Provider: Melida Quitter, [place] [address],  [place] [address]          Chief Complaint according to patient   Patient presents with:     New Patient (Initial Visit)     Question about inspire after nasal surgery and weight loss, can't tolerate CPAP       HISTORY OF PRESENT ILLNESS:  Jessica Gibson is a 75 y.o. Caucasian female patient seen here upon a requested sleep Consultation per dr Redmond Baseman, MD, ENT.  Seen on 04/23/2021 Inspire? .  Chief concern according to patient :   Paper referral for alternative treatment for OSA. Pt is interested in getting Inspire. Pt had SS done here in 2015 and 2019. Was last seen in our office on 11/09/17 for RV as well.  She recently has nasal surgery with ENT Dr Redmond Baseman, and she feels she already sleeps better.   Jessica Gibson has a past medical history of Arthritis, Atrophic vaginitis, CIN I (cervical intraepithelial neoplasia I) (03/2004), Heart murmur, History of kidney stones, vertigo, Hypercholesteremia, Hypertension, Nasal turbinate hypertrophy, OSA (obstructive sleep apnea), Other malaise and fatigue (02/28/2014), Post-operative nausea and vomiting, and Snoring (02/28/2014).   The patient had the first sleep study on April 05, 2014 at the time referred by her primary care physician Dr. Bevelyn Buckles.  The patient had a low sleep efficiency 60.9% only she did have a mild form of obstructive sleep apnea 12.9 her RDI was 24.2 which means that she was a loud snorer.  This was not REM dependent apnea with a REM sleep AHI of 29/h non-REM sleep AHI of 11.1.  There was also a lot of supine apnea exacerbation.  Supine AHI was 28.5 versus nonsupine 9.1.  She did only have 1612 2 minutes of desaturation which is not clinically significant.  She then had a new sleep study on 3-22 2019 there  were no periodic limb movements the patient slept very well for the last two thirds of the night but she preferred to sleep on her back.  She was titrated to CPAP beginning at 5 and ending at 6 cmH2O and her RDI or AHI reached 1.9.  She now underwent nasal surgery and feels that since she can breathe easier through her nose she may no longer have the same degree of sleep apnea or type of sleep apnea and also if sleep apnea is still present she would like to be evaluated if she is a valid candidate for the inspire device.  She endorsed the Epworth sleepiness score at 6 out of 24 points fatigue severity was low at 17.  Her geriatric depression score was endorsed at only 1 out of 15 points.  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     Sleep relevant medical history: Nocturia once, no RLS, averages 7 hours of sleep, no naps in daytime.  Nasal surgery recently restored her nasal patency.      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  Tobacco use; none .  ETOH use rare ,  Caffeine intake in form of Coffee( 2  cups in AM ) Soda( rare) Tea ( none) or energy drinks. Regular exercise in form of knee replacement pending. Dr Maureen Ralphs.    Hobbies :church group.    Sleep habits are as follows: The patient's dinner time is between 6 PM. The patient goes to bed and reads, is in bed to sleep by 11 PM and continues to sleep for 7-8 hours.  The preferred sleep position is sideways- horseshoe pillow- Dreams are reportedly frequent/vivid.  8  AM is the usual rise time. The patient wakes up spontaneously.  She reports not feeling refreshed or restored in AM, with symptoms such as dry mouth, morning headaches, and residual fatigue. This improved after nasal surgery-    Review of Systems: Out of a complete  14 system review, the patient complains of only the following symptoms, and all other reviewed systems are negative.:     How likely are you to doze in the following situations: 0 = not likely, 1 = slight chance, 2 = moderate chance, 3 = high chance   Sitting and Reading? Watching Television? Sitting inactive in a public place (theater or meeting)? As a passenger in a car for an hour without a break? Lying down in the afternoon when circumstances permit? Sitting and talking to someone? Sitting quietly after lunch without alcohol? In a car, while stopped for a few minutes in traffic?   Total = 6/ 24 points   FSS endorsed at 17/ 63 points.   Social History   Socioeconomic History   Marital status: Widowed    Spouse name: Not on file   Number of children: 2   Years of education: College   Highest education level: Not on file  Occupational History    Employer: SELF EMPLOYED  Tobacco Use   Smoking status: Never   Smokeless tobacco: Never  Vaping Use   Vaping Use: Never used  Substance and Sexual Activity   Alcohol use: Yes    Alcohol/week: 0.0 standard drinks    Comment: 2 drinks/month   Drug use: No   Sexual activity: Not Currently    Birth control/protection: Post-menopausal    Comment: 1st intercourse 75 yo-Fewer than 5 partners  Other Topics Concern   Not on file  Social History Narrative   Patient is widowed and lives alone.   Patient has two adult children.   Patient is self-employed, semi-retired.   Patient is right-handed.   Patient drinks three cups of coffee daily.   Patient has some college education.   Social Determinants of Health   Financial Resource Strain: Not on file  Food Insecurity: Not on file  Transportation Needs: Not on file  Physical Activity: Not on file  Stress: Not on file  Social Connections: Not on file    Family History  Problem Relation Age of Onset   Diabetes Mother    Hypertension Mother    Heart disease Mother    Heart  disease Father    Other Father        circulation problems   Colon cancer Neg Hx     Past Medical History:  Diagnosis Date   Arthritis    "wrist, shoulders; its just there "   Atrophic vaginitis    CIN I (cervical intraepithelial neoplasia I) 03/2004   LEEP   Heart murmur    dx in childohood; " i have a small hole in my hart but it doesnt do anything"    History of kidney stones    "i had  1 kidney at age 50 and at that time they went up, put a basket and retrieved "    Hx of vertigo    "3 to 4 months of it; but currently no symptoms becasue i used the epley maneuver"    Hypercholesteremia    cant tolerate statins; just watching diet ; currently on weight watchers   Hypertension    Nasal turbinate hypertrophy    dx per Dr. Redmond Baseman ; uses nasal spray for relief    OSA (obstructive sleep apnea)    initial dx in 2015 as mild; retest 07-09-17 dx with SEVERE OSA ; no current CPAP use, per Neuro Dr Maureen Chatters plan to complete CPAP titration (see 07-19-17 telephone note in epic)   Other malaise and fatigue 02/28/2014   Post-operative nausea and vomiting    Snoring 02/28/2014    Past Surgical History:  Procedure Laterality Date   ARTHROSCOPY OF KNEE Right    same practice as Dr Brunetta [facility] now [facility]    CERVICAL BIOPSY  W/ LOOP ELECTRODE EXCISION  2005   done with Dr Deeann Cree ; [place]. same practice as Dr Phineas Real; reports : " i guess it was benign because we didnt have to do anything"    [place] N/A 08/25/2017   Procedure: [place];  Surgeon: Anastasio Auerbach, MD;  Location: [place];  Service: Gynecology;  Laterality: N/A;  request to follow around 10:15am in [facility] block time. requests one hour OR time.   DRUG INDUCED ENDOSCOPY N/A 12/06/2020   Procedure: DRUG INDUCED ENDOSCOPY;  Surgeon: Melida Quitter, MD;  Location: [facility];  Service: ENT;   Laterality: N/A;   NASAL TURBINATE REDUCTION Bilateral 12/06/2020   Procedure: TURBINATE REDUCTION/SUBMUCOSAL RESECTION;  Surgeon: Melida Quitter, MD;  Location: [facility];  Service: ENT;  Laterality: Bilateral;   REPLACEMENT TOTAL KNEE Right 2009   dr Wynelle Link      Current Outpatient Medications on File Prior to Visit  Medication Sig Dispense Refill   acetaminophen (TYLENOL) 650 MG CR tablet Take 650 mg by mouth every 8 (eight) hours as needed for pain.     amLODipine (NORVASC) 10 MG tablet Take 10 mg by mouth daily.     amoxicillin (AMOXIL) 500 MG tablet Take 500 mg by mouth as needed (takes 4 tablets one hour before dental appts).     estradiol (ESTRACE) 0.1 MG/GM vaginal cream Use 1/2 g vaginally every night for the first 2 weeks, then use 1/2 g vaginally two or three times per week as needed to maintain symptom relief. (Patient taking differently: Place 0.5 g vaginally See admin instructions. Use 0.5 g vaginally every night for the first 2 weeks, then use 1/2 g vaginally two or three times per week as needed to maintain symptom relief.) 42.5 g 2   ibuprofen (ADVIL,MOTRIN) 200 MG tablet Take 400 mg by mouth every 6 (six) hours as needed for moderate pain (pain).     losartan-hydrochlorothiazide (HYZAAR) 100-12.5 MG tablet Take 1 tablet by mouth daily.     metoprolol tartrate (LOPRESSOR) 50 MG tablet Take 50 mg by mouth 2 (two) times daily.     minoxidil (LONITEN) 10 MG tablet Take 10 mg by mouth 2 (two) times daily.     No current facility-administered medications on file prior to visit.    Allergies  Allergen Reactions   Ezetimibe Other (See Comments)    Joint pain  Labetalol Hcl Other (See Comments)    Took way taste   Other Other (See Comments)   Pravastatin Other (See Comments)    Joint pain    Physical exam:  Today's Vitals   04/23/21 1103  BP: (!) 153/71  Pulse: 62  Weight: 160 lb 8 oz (72.8 kg)  Height: 5\' 2"  (1.575 m)   Body mass index is 29.36 kg/m.   Wt Readings  from Last 3 Encounters:  04/23/21 160 lb 8 oz (72.8 kg)  12/06/20 162 lb (73.5 kg)  09/10/20 164 lb (74.4 kg)     Ht Readings from Last 3 Encounters:  04/23/21 5\' 2"  (1.575 m)  12/06/20 5\' 1"  (1.549 m)  09/10/20 5\' 1"  (1.549 m)      General: The patient is awake, alert and appears not in acute distress. The patient is well groomed. Head: Normocephalic, atraumatic. Neck is supple.  Mallampati 2,  neck circumference:15 inches   Nasal airflow improved,  patent.  Retrognathia is not seen.  Cardiovascular:  Regular rate and cardiac rhythm by pulse,  without distended neck veins. Respiratory: Lungs are clear to auscultation.  Skin:  Without evidence of ankle edema, or rash. Trunk: The patient's posture is erect.   Neurologic exam : The patient is awake and alert, oriented to place and time.   Memory subjective described as intact.  Attention span & concentration ability appears normal.  Speech is fluent,  without  dysarthria, dysphonia or aphasia.  Mood and affect are appropriate.   Cranial nerves: no loss of smell or taste reported  Pupils are equal and briskly reactive to light. Funduscopic exam deferred, beginning cataracts. .  Extraocular movements in vertical and horizontal planes were intact and without nystagmus. No Diplopia. Visual fields by finger perimetry are intact. Hearing was intact to soft voice and finger rubbing.    Facial sensation intact to fine touch.  Facial motor strength is symmetric and tongue and uvula move midline.  Neck ROM : rotation, tilt and flexion extension were normal for age and shoulder shrug was symmetrical.    Motor exam:  Symmetric bulk, tone and ROM.   Normal tone without cog wheeling, symmetric grip strength .   Sensory:  Fine touch, pinprick and vibration were tested  and  normal.  Proprioception tested in the upper extremities was normal.   Coordination: Rapid alternating movements in the fingers/hands were of normal speed.  The  Finger-to-nose maneuver was intact without evidence of ataxia, dysmetria or tremor.   Gait and station: Patient could rise unassisted from a seated position, walked without assistive device.  Stance is of normal width/ base and the patient turned with 3 steps.  Toe and heel walk were deferred.  Deep tendon reflexes: in the  upper and lower extremities are symmetric and intact.  Babinski response was deferred.       After spending a total time of  45  minutes face to face and additional time for physical and neurologic examination, review of laboratory studies,  personal review of imaging studies, reports and results of other testing and review of referral information / records as far as provided in visit, I have established the following assessments:  1) still uncontrolled HTN after weight loss.  2) likely change in OSA since nasal surgery after she was not able to tolerate CPAP.  3) Inspire evaluation.    My Plan is to proceed with:  1) HST to evaluate for OSA at current level-  REM dependence ? Hypoxia? Marland Kitchen  2) I explained the type of apnea than can benefit from inspire.   Her morning headaches and dry mouth and fatigue  have improved post surgery. Her sleep is more restorative and refreshing.   I would like to thank Leanna Battles, MD and Melida Quitter, [address],  [place] [address] for allowing me to meet with and to take care of this pleasant patient.   I plan to follow up either personally or through our NP within 2-4 month.  Electronically signed by: Larey Seat, MD 04/23/2021 11:41 AM  [facility] Board certified by The [employer] of Sleep Medicine and Diplomate of the [employer] of Sleep Medicine. Board certified In Neurology through the [place], Fellow of the [employer] of Neurology. Medical Director of [facility].

## 2021-04-28 ENCOUNTER — Telehealth: Payer: Self-pay | Admitting: Neurology

## 2021-04-28 NOTE — Telephone Encounter (Signed)
LVM for pt to call me back to schedule sleep study  

## 2021-05-14 ENCOUNTER — Ambulatory Visit (INDEPENDENT_AMBULATORY_CARE_PROVIDER_SITE_OTHER): Payer: Medicare Other | Admitting: Neurology

## 2021-05-14 DIAGNOSIS — G4700 Insomnia, unspecified: Secondary | ICD-10-CM

## 2021-05-14 DIAGNOSIS — G4733 Obstructive sleep apnea (adult) (pediatric): Secondary | ICD-10-CM

## 2021-05-14 DIAGNOSIS — G478 Other sleep disorders: Secondary | ICD-10-CM

## 2021-05-14 DIAGNOSIS — Z9889 Other specified postprocedural states: Secondary | ICD-10-CM

## 2021-05-14 DIAGNOSIS — Z789 Other specified health status: Secondary | ICD-10-CM

## 2021-05-20 NOTE — Progress Notes (Signed)
IMPRESSION: REM exacerbated OSA- This HST confirms the presence of ongoing severe sleep apnea /obstructive in nature and clustering in REM sleep.  Since REM sleep was concentrated after 2:30 AM there were many more apneas and hypopneas noted in that the later half of the study.   The patient slept very little in supine sleep a total of 55 minutes and I would advise her to avoid supine sleep as much as possible. No hypoxemia was present.    RECOMMENDATION:I expect that only the non-REM sleep apnea could be reduced by Inspire. This still would account for over 50 % reduction in apnea. Avoiding supine sleep may further reduce the overall AHI to mild degree apnea, AHI 15 or below.

## 2021-05-20 NOTE — Procedures (Signed)
[facility] TEST REPORT ( by [facility] PAT)   STUDY DATE:  05-19-2021 DOB:  May 14, 1946    ORDERING CLINICIAN: Larey Seat, MD  REFERRING CLINICIAN: Dr Redmond Baseman, ENT   CLINICAL INFORMATION/HISTORY: 04-23-2021 , HST for this Inspire interested  patient  to evaluate apnea type and degree and expected therapy effect. CPAP intolerant patient who had nasal surgery.    Epworth sleepiness score: 6/24.   BMI: 30 kg/m   Neck Circumference: 15 inches   FINDINGS:   Sleep Summary:   Total Recording Time (hours, min): Total recording time for this home sleep test was 8 hours and 53 minutes of which 7 hours and 51 minutes were the calculated total sleep time.  There were 24.6% REM sleep           Percent REM (%): 24.6%                                       Respiratory Indices:   Calculated pAHI (per hour): The calculated AHI by 4 % desaturation was 18.9/h and by 3% desaturation 32.9/h . In REM sleep AHI was 51.8/h and in non-REM sleep 26.7/h.                                                 Positional AHI: In supine sleep the AHI was 65.2/h sleeping on the right side 29.8/h and on the left 26.7/h.  Snoring was of moderate volume with a mean volume of 41 dB and accompanied 34% of total sleep time.                                                 Oxygen Saturation Statistics:                     O2 Saturation Range (%): Between a nadir at 82% and a maximum of 98% the mean oxygenation was 92%.  There were only 6.4 minutes of hypoxia noted which is clinically irrelevant..                                       O2 Saturation (minutes) <89%:6.4 minutes.           Pulse Rate Statistics:  Pulse Range:   Between 50 and 80 bpm with a mean heart rate of 58 bpm.              IMPRESSION: REM exacerbated OSA- This HST confirms the presence of ongoing severe sleep apnea /obstructive in nature and clustering in REM sleep.  Since REM sleep was concentrated after 2:30 AM there  were many more apneas and hypopneas noted in that the later half of the study.   The patient slept very little in supine sleep a total of 55 minutes and I would advise her to avoid supine sleep as much as possible. No hypoxemia was present.     RECOMMENDATION:I expect that only the non-REM sleep apnea could be reduced by Inspire. This  still would account for over 50 % reduction in apnea. Avoiding supine sleep may further reduce the overall AHI to mild degree apnea, AHI 15 or below.    INTERPRETING PHYSICIAN:   Larey Seat, MD   Medical Director of [facility] at [facility].

## 2021-05-20 NOTE — Progress Notes (Signed)
[facility] TEST REPORT ( by Watch PAT)   STUDY DATE:  05-19-2021 DOB:  1945/09/03    ORDERING CLINICIAN: Larey Seat, MD  REFERRING CLINICIAN: Dr Redmond Baseman, ENT   CLINICAL INFORMATION/HISTORY: 04-23-2021 , HST for this Inspire interested  patient  to evaluate apnea type and degree and expected therapy effect. CPAP intolerant patient who had nasal surgery.    Epworth sleepiness score: 6/24.   BMI: 30 kg/m   Neck Circumference: 15 inches   FINDINGS:   Sleep Summary:   Total Recording Time (hours, min): Total recording time for this home sleep test was 8 hours and 53 minutes of which 7 hours and 51 minutes were the calculated total sleep time.  There were 24.6% REM sleep           Percent REM (%): 24.6%                                       Respiratory Indices:   Calculated pAHI (per hour): The calculated AHI by 4 % desaturation was 18.9/h and by 3% desaturation 32.9/h . In REM sleep AHI was 51.8/h and in non-REM sleep 26.7/h.                                                 Positional AHI: In supine sleep the AHI was 65.2/h sleeping on the right side 29.8/h and on the left 26.7/h.  Snoring was of moderate volume with a mean volume of 41 dB and accompanied 34% of total sleep time.                                                 Oxygen Saturation Statistics:                     O2 Saturation Range (%): Between a nadir at 82% and a maximum of 98% the mean oxygenation was 92%.  There were only 6.4 minutes of hypoxia noted which is clinically irrelevant..                                       O2 Saturation (minutes) <89%:6.4 minutes.           Pulse Rate Statistics:  Pulse Range:   Between 50 and 80 bpm with a mean heart rate of 58 bpm.              IMPRESSION: REM exacerbated OSA- This HST confirms the presence of ongoing severe sleep apnea /obstructive in nature and clustering in REM sleep.  Since REM sleep was concentrated after 2:30 AM  there were many more apneas and hypopneas noted in that the later half of the study.   The patient slept very little in supine sleep a total of 55 minutes and I would advise her to avoid supine sleep as much as possible. No hypoxemia was present.     RECOMMENDATION:I expect that only the non-REM sleep apnea could be reduced by Inspire.  INTERPRETING PHYSICIAN:   Larey Seat, MD   Medical Director of [facility].

## 2021-05-21 ENCOUNTER — Telehealth: Payer: Self-pay | Admitting: Neurology

## 2021-05-21 NOTE — Telephone Encounter (Signed)
-----   Message from Larey Seat, MD sent at 05/20/2021  1:32 PM EST ----- IMPRESSION: REM exacerbated OSA- This HST confirms the presence of ongoing severe sleep apnea /obstructive in nature and clustering in REM sleep.  Since REM sleep was concentrated after 2:30 AM there were many more apneas and hypopneas noted in that the later half of the study.   The patient slept very little in supine sleep a total of 55 minutes and I would advise her to avoid supine sleep as much as possible. No hypoxemia was present.    RECOMMENDATION:I expect that only the non-REM sleep apnea could be reduced by Inspire. This still would account for over 50 % reduction in apnea. Avoiding supine sleep may further reduce the overall AHI to mild degree apnea, AHI 15 or below.

## 2021-05-21 NOTE — Telephone Encounter (Signed)
Called the patient and reviewed the sleep study results.  Advised the study showed the apnea events were in the moderate to severe range.  Patient has attempted CPAP previously and was unable to tolerate.  She is worked with the ENT physician on a nasal surgery to try and help.  She is interested in pursuing inspire.  Advised that based off of the study, the inspire may not treat her apnea but could potentially reduce the apnea.  Inspire along with avoiding sleeping on her back could hopefully reduce the apnea to a lower range.  Advised we would not know until completing a sleep study after the procedure.  Patient wanted to make sure this report has gone to Dr. Redmond Baseman and will follow-up with their office on the next steps.  She verbalized understanding of the study and had no concerns or questions at this time.

## 2021-07-07 DIAGNOSIS — G4733 Obstructive sleep apnea (adult) (pediatric): Secondary | ICD-10-CM | POA: Diagnosis not present

## 2021-07-07 DIAGNOSIS — I10 Essential (primary) hypertension: Secondary | ICD-10-CM | POA: Diagnosis not present

## 2021-07-07 DIAGNOSIS — E785 Hyperlipidemia, unspecified: Secondary | ICD-10-CM | POA: Diagnosis not present

## 2021-07-07 DIAGNOSIS — M1990 Unspecified osteoarthritis, unspecified site: Secondary | ICD-10-CM | POA: Diagnosis not present

## 2021-07-22 DIAGNOSIS — Z85828 Personal history of other malignant neoplasm of skin: Secondary | ICD-10-CM | POA: Diagnosis not present

## 2021-07-22 DIAGNOSIS — C44311 Basal cell carcinoma of skin of nose: Secondary | ICD-10-CM | POA: Diagnosis not present

## 2021-07-22 DIAGNOSIS — C44319 Basal cell carcinoma of skin of other parts of face: Secondary | ICD-10-CM | POA: Diagnosis not present

## 2021-07-22 DIAGNOSIS — D485 Neoplasm of uncertain behavior of skin: Secondary | ICD-10-CM | POA: Diagnosis not present

## 2021-07-22 DIAGNOSIS — L570 Actinic keratosis: Secondary | ICD-10-CM | POA: Diagnosis not present

## 2021-08-20 DIAGNOSIS — C44311 Basal cell carcinoma of skin of nose: Secondary | ICD-10-CM | POA: Diagnosis not present

## 2021-08-20 DIAGNOSIS — Z85828 Personal history of other malignant neoplasm of skin: Secondary | ICD-10-CM | POA: Diagnosis not present

## 2021-08-29 ENCOUNTER — Other Ambulatory Visit: Payer: Self-pay | Admitting: Otolaryngology

## 2021-09-15 DIAGNOSIS — E785 Hyperlipidemia, unspecified: Secondary | ICD-10-CM | POA: Diagnosis not present

## 2021-09-15 DIAGNOSIS — I10 Essential (primary) hypertension: Secondary | ICD-10-CM | POA: Diagnosis not present

## 2021-09-15 DIAGNOSIS — E559 Vitamin D deficiency, unspecified: Secondary | ICD-10-CM | POA: Diagnosis not present

## 2021-09-19 ENCOUNTER — Other Ambulatory Visit: Payer: Self-pay

## 2021-09-19 ENCOUNTER — Encounter (HOSPITAL_BASED_OUTPATIENT_CLINIC_OR_DEPARTMENT_OTHER): Payer: Self-pay | Admitting: Otolaryngology

## 2021-09-22 DIAGNOSIS — E785 Hyperlipidemia, unspecified: Secondary | ICD-10-CM | POA: Diagnosis not present

## 2021-09-22 DIAGNOSIS — G4733 Obstructive sleep apnea (adult) (pediatric): Secondary | ICD-10-CM | POA: Diagnosis not present

## 2021-09-22 DIAGNOSIS — I10 Essential (primary) hypertension: Secondary | ICD-10-CM | POA: Diagnosis not present

## 2021-09-22 DIAGNOSIS — E559 Vitamin D deficiency, unspecified: Secondary | ICD-10-CM | POA: Diagnosis not present

## 2021-09-22 DIAGNOSIS — K219 Gastro-esophageal reflux disease without esophagitis: Secondary | ICD-10-CM | POA: Diagnosis not present

## 2021-09-22 DIAGNOSIS — Z1339 Encounter for screening examination for other mental health and behavioral disorders: Secondary | ICD-10-CM | POA: Diagnosis not present

## 2021-09-22 DIAGNOSIS — R82998 Other abnormal findings in urine: Secondary | ICD-10-CM | POA: Diagnosis not present

## 2021-09-22 DIAGNOSIS — Z1212 Encounter for screening for malignant neoplasm of rectum: Secondary | ICD-10-CM | POA: Diagnosis not present

## 2021-09-22 DIAGNOSIS — T466X5A Adverse effect of antihyperlipidemic and antiarteriosclerotic drugs, initial encounter: Secondary | ICD-10-CM | POA: Diagnosis not present

## 2021-09-22 DIAGNOSIS — Z1331 Encounter for screening for depression: Secondary | ICD-10-CM | POA: Diagnosis not present

## 2021-09-22 DIAGNOSIS — Z0000 Encounter for general adult medical examination without abnormal findings: Secondary | ICD-10-CM | POA: Diagnosis not present

## 2021-09-24 ENCOUNTER — Encounter (HOSPITAL_BASED_OUTPATIENT_CLINIC_OR_DEPARTMENT_OTHER)
Admission: RE | Admit: 2021-09-24 | Discharge: 2021-09-24 | Disposition: A | Discharge status: Home / Self Care | Payer: Medicare Other | Source: Ambulatory Visit | Attending: Otolaryngology | Admitting: Otolaryngology

## 2021-09-24 DIAGNOSIS — I150 Renovascular hypertension: Secondary | ICD-10-CM | POA: Insufficient documentation

## 2021-09-24 DIAGNOSIS — Z01812 Encounter for preprocedural laboratory examination: Secondary | ICD-10-CM | POA: Insufficient documentation

## 2021-09-24 LAB — BASIC METABOLIC PANEL
Anion gap: 8 (ref 5–15)
BUN: 19 mg/dL (ref 8–23)
CO2: 27 mmol/L (ref 22–32)
Calcium: 9.8 mg/dL (ref 8.9–10.3)
Chloride: 104 mmol/L (ref 98–111)
Creatinine, Ser: 0.9 mg/dL (ref 0.44–1.00)
GFR, Estimated: >60 mL/min (ref >60)
Glucose, Bld: 99 mg/dL (ref 70–99)
Potassium: 4.4 mmol/L (ref 3.5–5.1)
Sodium: 139 mmol/L (ref 135–145)

## 2021-09-29 DIAGNOSIS — G4733 Obstructive sleep apnea (adult) (pediatric): Secondary | ICD-10-CM | POA: Diagnosis not present

## 2021-09-30 ENCOUNTER — Other Ambulatory Visit: Payer: Self-pay

## 2021-09-30 ENCOUNTER — Ambulatory Visit (HOSPITAL_BASED_OUTPATIENT_CLINIC_OR_DEPARTMENT_OTHER)
Admission: RE | Admit: 2021-09-30 | Discharge: 2021-09-30 | Disposition: A | Discharge status: Home / Self Care | Payer: Medicare Other | Attending: Otolaryngology | Admitting: Otolaryngology

## 2021-09-30 ENCOUNTER — Encounter (HOSPITAL_BASED_OUTPATIENT_CLINIC_OR_DEPARTMENT_OTHER)
Admission: RE | Disposition: A | Discharge status: Home / Self Care | Payer: Self-pay | Source: Home / Self Care | Attending: Otolaryngology

## 2021-09-30 ENCOUNTER — Ambulatory Visit (HOSPITAL_BASED_OUTPATIENT_CLINIC_OR_DEPARTMENT_OTHER): Payer: Medicare Other | Admitting: Certified Registered"

## 2021-09-30 ENCOUNTER — Ambulatory Visit (HOSPITAL_COMMUNITY): Payer: Medicare Other

## 2021-09-30 ENCOUNTER — Encounter (HOSPITAL_BASED_OUTPATIENT_CLINIC_OR_DEPARTMENT_OTHER): Payer: Self-pay | Admitting: Otolaryngology

## 2021-09-30 DIAGNOSIS — M1990 Unspecified osteoarthritis, unspecified site: Secondary | ICD-10-CM | POA: Diagnosis not present

## 2021-09-30 DIAGNOSIS — I517 Cardiomegaly: Secondary | ICD-10-CM | POA: Diagnosis not present

## 2021-09-30 DIAGNOSIS — I119 Hypertensive heart disease without heart failure: Secondary | ICD-10-CM | POA: Diagnosis not present

## 2021-09-30 DIAGNOSIS — R42 Dizziness and giddiness: Secondary | ICD-10-CM | POA: Diagnosis not present

## 2021-09-30 DIAGNOSIS — Z6830 Body mass index (BMI) 30.0-30.9, adult: Secondary | ICD-10-CM | POA: Diagnosis not present

## 2021-09-30 DIAGNOSIS — G4733 Obstructive sleep apnea (adult) (pediatric): Secondary | ICD-10-CM

## 2021-09-30 DIAGNOSIS — I150 Renovascular hypertension: Secondary | ICD-10-CM

## 2021-09-30 DIAGNOSIS — I10 Essential (primary) hypertension: Secondary | ICD-10-CM

## 2021-09-30 DIAGNOSIS — Z79899 Other long term (current) drug therapy: Secondary | ICD-10-CM | POA: Diagnosis not present

## 2021-09-30 DIAGNOSIS — Z969 Presence of functional implant, unspecified: Secondary | ICD-10-CM | POA: Diagnosis not present

## 2021-09-30 DIAGNOSIS — Z462 Encounter for fitting and adjustment of other devices related to nervous system and special senses: Secondary | ICD-10-CM | POA: Diagnosis not present

## 2021-09-30 DIAGNOSIS — Z6829 Body mass index (BMI) 29.0-29.9, adult: Secondary | ICD-10-CM | POA: Insufficient documentation

## 2021-09-30 DIAGNOSIS — E663 Overweight: Secondary | ICD-10-CM | POA: Diagnosis not present

## 2021-09-30 DIAGNOSIS — M47812 Spondylosis without myelopathy or radiculopathy, cervical region: Secondary | ICD-10-CM | POA: Diagnosis not present

## 2021-09-30 HISTORY — PX: IMPLANTATION OF HYPOGLOSSAL NERVE STIMULATOR: SHX6827

## 2021-09-30 SURGERY — INSERTION, HYPOGLOSSAL NERVE STIMULATOR
Anesthesia: General | Site: Neck | Laterality: Right

## 2021-09-30 MED ORDER — FENTANYL CITRATE (PF) 100 MCG/2ML IJ SOLN
INTRAMUSCULAR | Status: AC
  Filled 2021-09-30: qty 2

## 2021-09-30 MED ORDER — FENTANYL CITRATE (PF) 100 MCG/2ML IJ SOLN
INTRAMUSCULAR | Status: DC | PRN
  Administered 2021-09-30 (×4): 50 ug via INTRAVENOUS

## 2021-09-30 MED ORDER — OXYMETAZOLINE HCL 0.05 % NA SOLN
NASAL | Status: AC
  Filled 2021-09-30: qty 60

## 2021-09-30 MED ORDER — FENTANYL CITRATE (PF) 100 MCG/2ML IJ SOLN
25.0000 ug | INTRAMUSCULAR | Status: DC | PRN
  Administered 2021-09-30 (×2): 50 ug via INTRAVENOUS

## 2021-09-30 MED ORDER — SUCCINYLCHOLINE CHLORIDE 200 MG/10ML IV SOSY
PREFILLED_SYRINGE | INTRAVENOUS | Status: DC | PRN
Start: 2021-09-30 — End: 2021-09-30
  Administered 2021-09-30: 100 mg via INTRAVENOUS

## 2021-09-30 MED ORDER — LIDOCAINE-EPINEPHRINE 1 %-1:100000 IJ SOLN
INTRAMUSCULAR | Status: AC
  Filled 2021-09-30: qty 1

## 2021-09-30 MED ORDER — AMISULPRIDE (ANTIEMETIC) 5 MG/2ML IV SOLN
10.0000 mg | Freq: Once | INTRAVENOUS | Status: AC | PRN
  Administered 2021-09-30: 10 mg via INTRAVENOUS

## 2021-09-30 MED ORDER — LIDOCAINE-EPINEPHRINE 1 %-1:100000 IJ SOLN
INTRAMUSCULAR | Status: DC | PRN
  Administered 2021-09-30: 5 mL

## 2021-09-30 MED ORDER — 0.9 % SODIUM CHLORIDE (POUR BTL) OPTIME
TOPICAL | Status: DC | PRN
  Administered 2021-09-30: 135 mL

## 2021-09-30 MED ORDER — KETOROLAC TROMETHAMINE 30 MG/ML IJ SOLN
INTRAMUSCULAR | Status: AC
  Filled 2021-09-30: qty 1

## 2021-09-30 MED ORDER — ONDANSETRON HCL 4 MG/2ML IJ SOLN: 4.0000 mg | Freq: Once | INTRAMUSCULAR | Status: DC | PRN

## 2021-09-30 MED ORDER — CEFAZOLIN SODIUM-DEXTROSE 2-4 GM/100ML-% IV SOLN: 2.0000 g | INTRAVENOUS | Status: DC

## 2021-09-30 MED ORDER — LACTATED RINGERS IV SOLN: INTRAVENOUS | Status: DC

## 2021-09-30 MED ORDER — ONDANSETRON HCL 4 MG/2ML IJ SOLN
INTRAMUSCULAR | Status: DC | PRN
  Administered 2021-09-30: 4 mg via INTRAVENOUS

## 2021-09-30 MED ORDER — CEFAZOLIN SODIUM-DEXTROSE 2-4 GM/100ML-% IV SOLN
INTRAVENOUS | Status: AC
  Filled 2021-09-30: qty 100

## 2021-09-30 MED ORDER — AMISULPRIDE (ANTIEMETIC) 5 MG/2ML IV SOLN
INTRAVENOUS | Status: AC
  Filled 2021-09-30: qty 4

## 2021-09-30 MED ORDER — PROPOFOL 10 MG/ML IV BOLUS
INTRAVENOUS | Status: DC | PRN
  Administered 2021-09-30: 140 mg via INTRAVENOUS

## 2021-09-30 MED ORDER — PROPOFOL 500 MG/50ML IV EMUL
INTRAVENOUS | Status: DC | PRN
Start: 2021-09-30 — End: 2021-09-30
  Administered 2021-09-30: 35 ug/kg/min via INTRAVENOUS

## 2021-09-30 MED ORDER — DEXAMETHASONE SODIUM PHOSPHATE 4 MG/ML IJ SOLN
INTRAMUSCULAR | Status: DC | PRN
  Administered 2021-09-30: 5 mg via INTRAVENOUS

## 2021-09-30 MED ORDER — EPHEDRINE SULFATE (PRESSORS) 50 MG/ML IJ SOLN
INTRAMUSCULAR | Status: DC | PRN
  Administered 2021-09-30 (×2): 10 mg via INTRAVENOUS

## 2021-09-30 MED ORDER — LIDOCAINE HCL (CARDIAC) PF 100 MG/5ML IV SOSY
PREFILLED_SYRINGE | INTRAVENOUS | Status: DC | PRN
  Administered 2021-09-30: 60 mg via INTRAVENOUS

## 2021-09-30 MED ORDER — PHENYLEPHRINE HCL-NACL 20-0.9 MG/250ML-% IV SOLN
INTRAVENOUS | Status: DC | PRN
  Administered 2021-09-30: 40 ug/min via INTRAVENOUS

## 2021-09-30 MED ORDER — HYDROCODONE-ACETAMINOPHEN 7.5-325 MG PO TABS: 1.0000 | ORAL_TABLET | Freq: Four times a day (QID) | ORAL | 0 refills | Status: DC | PRN

## 2021-09-30 MED ORDER — KETOROLAC TROMETHAMINE 15 MG/ML IJ SOLN
15.0000 mg | Freq: Once | INTRAMUSCULAR | Status: AC | PRN
  Administered 2021-09-30: 15 mg via INTRAVENOUS

## 2021-09-30 SURGICAL SUPPLY — 67 items
ACC NRSTM 4 TRQ WRNCH STRL (MISCELLANEOUS)
ADH SKN CLS APL DERMABOND .7 (GAUZE/BANDAGES/DRESSINGS) ×2
BLADE CLIPPER SURG (BLADE) IMPLANT
BLADE SURG 15 STRL LF DISP TIS (BLADE) ×1 IMPLANT
BLADE SURG 15 STRL SS (BLADE) ×2
CANISTER SUCT 1200ML W/VALVE (MISCELLANEOUS) ×2 IMPLANT
CORD BIPOLAR FORCEPS 12FT (ELECTRODE) ×2 IMPLANT
COVER PROBE W GEL 5X96 (DRAPES) ×2 IMPLANT
DERMABOND ADVANCED (GAUZE/BANDAGES/DRESSINGS) ×2
DERMABOND ADVANCED .7 DNX12 (GAUZE/BANDAGES/DRESSINGS) ×2 IMPLANT
DRAPE C-ARM 35X43 STRL (DRAPES) ×2 IMPLANT
DRAPE HEAD BAR (DRAPES) IMPLANT
DRAPE INCISE IOBAN 66X45 STRL (DRAPES) ×2 IMPLANT
DRAPE MICROSCOPE WILD 40.5X102 (DRAPES) ×2 IMPLANT
DRAPE UTILITY XL STRL (DRAPES) ×2 IMPLANT
DRSG TEGADERM 2-3/8X2-3/4 SM (GAUZE/BANDAGES/DRESSINGS) ×2 IMPLANT
DRSG TEGADERM 4X4.75 (GAUZE/BANDAGES/DRESSINGS) IMPLANT
ELECT COATED BLADE 2.86 ST (ELECTRODE) ×2 IMPLANT
ELECT EMG 18 NIMS (NEUROSURGERY SUPPLIES) ×2
ELECT REM PT RETURN 9FT ADLT (ELECTROSURGICAL) ×2
ELECTRODE EMG 18 NIMS (NEUROSURGERY SUPPLIES) ×1 IMPLANT
ELECTRODE REM PT RTRN 9FT ADLT (ELECTROSURGICAL) ×1 IMPLANT
FORCEPS BIPOLAR SPETZLER 8 1.0 (NEUROSURGERY SUPPLIES) ×2 IMPLANT
GAUZE 4X4 16PLY ~~LOC~~+RFID DBL (SPONGE) ×2 IMPLANT
GAUZE SPONGE 4X4 12PLY STRL (GAUZE/BANDAGES/DRESSINGS) ×2 IMPLANT
GENERATOR PULSE INSPIRE (Generator) ×2 IMPLANT
GENERATOR PULSE INSPIRE IV (Generator) ×1 IMPLANT
GLOVE BIO SURGEON STRL SZ 6.5 (GLOVE) IMPLANT
GLOVE BIO SURGEON STRL SZ7.5 (GLOVE) ×2 IMPLANT
GOWN STRL REUS W/ TWL LRG LVL3 (GOWN DISPOSABLE) ×3 IMPLANT
GOWN STRL REUS W/TWL LRG LVL3 (GOWN DISPOSABLE) ×6
IV CATH 18G SAFETY (IV SOLUTION) ×2 IMPLANT
KIT NEURO ACCESSORY W/WRENCH (MISCELLANEOUS) IMPLANT
LEAD SENSING RESP INSPIRE (Lead) ×2 IMPLANT
LEAD SENSING RESP INSPIRE IV (Lead) ×1 IMPLANT
LEAD SLEEP STIM INSPIRE IV/V (Lead) ×1 IMPLANT
LEAD SLEEP STIMULATION INSPIRE (Lead) ×2 IMPLANT
LOOP VESSEL MAXI BLUE (MISCELLANEOUS) ×2 IMPLANT
LOOP VESSEL MINI RED (MISCELLANEOUS) ×2 IMPLANT
MARKER SKIN DUAL TIP RULER LAB (MISCELLANEOUS) ×2 IMPLANT
NDL HYPO 25X1 1.5 SAFETY (NEEDLE) ×1 IMPLANT
NEEDLE HYPO 25X1 1.5 SAFETY (NEEDLE) ×2 IMPLANT
NS IRRIG 1000ML POUR BTL (IV SOLUTION) ×2 IMPLANT
PACK BASIN DAY SURGERY FS (CUSTOM PROCEDURE TRAY) ×2 IMPLANT
PACK ENT DAY SURGERY (CUSTOM PROCEDURE TRAY) ×2 IMPLANT
PASSER CATH 36 CODMAN DISP (NEUROSURGERY SUPPLIES) ×1 IMPLANT
PASSER CATH 38CM DISP (INSTRUMENTS) IMPLANT
PENCIL SMOKE EVACUATOR (MISCELLANEOUS) ×2 IMPLANT
PROBE NERVE STIMULATOR (NEUROSURGERY SUPPLIES) ×2 IMPLANT
REMOTE CONTROL SLEEP INSPIRE (MISCELLANEOUS) ×2 IMPLANT
SET WALTER ACTIVATION W/DRAPE (SET/KITS/TRAYS/PACK) ×2 IMPLANT
SLEEVE SCD COMPRESS KNEE MED (STOCKING) ×2 IMPLANT
SPONGE INTESTINAL PEANUT (DISPOSABLE) ×2 IMPLANT
SPONGE T-LAP 4X18 ~~LOC~~+RFID (SPONGE) ×1 IMPLANT
SUT SILK 2 0 SH (SUTURE) ×2 IMPLANT
SUT SILK 3 0 REEL (SUTURE) ×2 IMPLANT
SUT SILK 3 0 SH 30 (SUTURE) ×2 IMPLANT
SUT SILK 3-0 (SUTURE) ×4
SUT SILK 3-0 RB1 30XBRD (SUTURE) ×2
SUT VIC AB 3-0 SH 27 (SUTURE) ×4
SUT VIC AB 3-0 SH 27X BRD (SUTURE) ×1 IMPLANT
SUT VIC AB 4-0 PS2 27 (SUTURE) ×2 IMPLANT
SUT VICRYL 4-0 PS2 18IN ABS (SUTURE) ×1 IMPLANT
SUTURE SILK 3-0 RB1 30XBRD (SUTURE) ×2 IMPLANT
SYR 10ML LL (SYRINGE) ×2 IMPLANT
SYR BULB EAR ULCER 3OZ GRN STR (SYRINGE) ×2 IMPLANT
TOWEL GREEN STERILE FF (TOWEL DISPOSABLE) ×4 IMPLANT

## 2021-09-30 NOTE — H&P (Signed)
Jessica Gibson is an 76 y.o. female.   ?Chief Complaint: Sleep apnea ?HPI: 76 year old female with obstructive sleep apnea who has been unable to tolerate CPAP. ? ?Past Medical History:  ?Diagnosis Date  ? Arthritis   ? "wrist, shoulders; its just there "  ? Atrophic vaginitis   ? CIN I (cervical intraepithelial neoplasia I) 03/2004  ? LEEP  ? Heart murmur   ? dx in childohood; " i have a small hole in my hart but it doesnt do anything"   ? History of kidney stones   ? "i had 1 kidney at age 41 and at that time they went up, put a basket and retrieved "   ? Hx of vertigo   ? "3 to 4 months of it; but currently no symptoms becasue i used the epley maneuver"   ? Hypercholesteremia   ? cant tolerate statins; just watching diet ; currently on weight watchers  ? Hypertension   ? Nasal turbinate hypertrophy   ? dx per Dr. Redmond Baseman ; uses nasal spray for relief   ? OSA (obstructive sleep apnea)   ? initial dx in 2015 as mild; retest 07-09-17 dx with SEVERE OSA ; no current CPAP use, per Neuro Dr Maureen Chatters plan to complete CPAP titration (see 07-19-17 telephone note in epic)  ? Other malaise and fatigue 02/28/2014  ? Post-operative nausea and vomiting   ? Snoring 02/28/2014  ? ? ?Past Surgical History:  ?Procedure Laterality Date  ? ARTHROSCOPY OF KNEE Right   ? same practice as Dr Brunetta Genera [facility] now [facility]   ? CERVICAL BIOPSY  W/ LOOP ELECTRODE EXCISION  2005  ? done with Dr Deeann Cree ; [place]. same practice as Dr Phineas Real; reports : " i guess it was benign because we didnt have to do anything"   ? CYSTOSCOPY    ? DILATATION & CURETTAGE/HYSTEROSCOPY WITH MYOSURE N/A 08/25/2017  ? Procedure: [place];  Surgeon: Anastasio Auerbach, MD;  Location: [place];  Service: Gynecology;  Laterality: N/A;  request to follow around 10:15am in [facility] block time. ?requests one hour OR time.  ? DRUG INDUCED ENDOSCOPY N/A 12/06/2020  ? Procedure: DRUG INDUCED ENDOSCOPY;   Surgeon: Melida Quitter, MD;  Location: [place];  Service: ENT;  Laterality: N/A;  ? NASAL TURBINATE REDUCTION Bilateral 12/06/2020  ? Procedure: TURBINATE REDUCTION/SUBMUCOSAL RESECTION;  Surgeon: Melida Quitter, MD;  Location: [place];  Service: ENT;  Laterality: Bilateral;  ? REPLACEMENT TOTAL KNEE Right 2009  ? dr Wynelle Link   ? ? ?Family History  ?Problem Relation Age of Onset  ? Diabetes Mother   ? Hypertension Mother   ? Heart disease Mother   ? Heart disease Father   ? Other Father   ?     circulation problems  ? Colon cancer Neg Hx   ? ?Social History:  reports that she has never smoked. She has never used smokeless tobacco. She reports current alcohol use. She reports that she does not use drugs. ? ?Allergies:  ?Allergies  ?Allergen Reactions  ? Ezetimibe Other (See Comments)  ?  Joint pain  ? Labetalol Hcl Other (See Comments)  ?  Took way taste  ? Other Other (See Comments)  ? Pravastatin Other (See Comments)  ?  Joint pain  ? ? ?Medications Prior to Admission  ?Medication Sig Dispense Refill  ? acetaminophen (TYLENOL) 650 MG CR tablet Take 650 mg by mouth every 8 (eight)  hours as needed for pain.    ? amLODipine (NORVASC) 10 MG tablet Take 10 mg by mouth daily.    ? ibuprofen (ADVIL,MOTRIN) 200 MG tablet Take 400 mg by mouth every 6 (six) hours as needed for moderate pain (pain).    ? losartan-hydrochlorothiazide (HYZAAR) 100-12.5 MG tablet Take 1 tablet by mouth daily.    ? metoprolol tartrate (LOPRESSOR) 50 MG tablet Take 50 mg by mouth 2 (two) times daily.    ? minoxidil (LONITEN) 10 MG tablet Take 10 mg by mouth 2 (two) times daily.    ? amoxicillin (AMOXIL) 500 MG tablet Take 500 mg by mouth as needed (takes 4 tablets one hour before dental appts).    ? ? ?No results found for this or any previous visit (from the past 48 hour(s)). ?No results found. ? ?Review of Systems  ?All other systems reviewed and are negative. ? ?Blood pressure (!) 149/67, pulse 61, temperature 97.9 ?F (36.6 ?C), temperature source  Oral, resp. rate 20, height '5\' 2"'$  (1.575 m), weight 73 kg, SpO2 98 %. ?Physical Exam ?Constitutional:   ?   Appearance: Normal appearance. She is normal weight.  ?HENT:  ?   Head: Normocephalic and atraumatic.  ?   Right Ear: External ear normal.  ?   Left Ear: External ear normal.  ?   Nose: Nose normal.  ?   Mouth/Throat:  ?   Mouth: Mucous membranes are moist.  ?   Pharynx: Oropharynx is clear.  ?Eyes:  ?   Extraocular Movements: Extraocular movements intact.  ?   Conjunctiva/sclera: Conjunctivae normal.  ?   Pupils: Pupils are equal, round, and reactive to light.  ?Cardiovascular:  ?   Rate and Rhythm: Normal rate.  ?Pulmonary:  ?   Effort: Pulmonary effort is normal.  ?Musculoskeletal:  ?   Cervical back: Normal range of motion.  ?Skin: ?   General: Skin is warm and dry.  ?Neurological:  ?   General: No focal deficit present.  ?   Mental Status: She is alert and oriented to person, place, and time.  ?Psychiatric:     ?   Mood and Affect: Mood normal.     ?   Behavior: Behavior normal.     ?   Thought Content: Thought content normal.     ?   Judgment: Judgment normal.  ?  ? ?Assessment/Plan ?Obstructive sleep apnea and BMI 29.44. ? ?To OR for hypoglossal nerve stimulator placement. ? ?Melida Quitter, MD ?09/30/2021, 1:49 PM ? ? ? ?

## 2021-09-30 NOTE — Op Note (Signed)
PREOPERATIVE DIAGNOSIS:  Obstructive sleep apnea. ?  ?POSTOPERATIVE DIAGNOSIS:  Obstructive sleep apnea. ?  ?PROCEDURE:  Placement of hypoglossal nerve stimulator including placement of sensor lead and testing of stimulator. ?  ?SURGEON:  Melida Quitter, MD ?  ?ASSISTANT:  Jolene Provost, PA, who was necessary for assistance with retraction and closure. ?  ?ANESTHESIA:  General endotracheal anesthesia. ?  ?COMPLICATIONS:  None. ?  ?INDICATIONS:  The patient is a 76 year old female with a history of obstructive sleep apnea who has not been able to tolerate CPAP.  She presents to the operating room for placement of hypoglossal nerve stimulator. ?  ?FINDINGS:  Surgical anatomy was unremarkable.  The device was tested intraoperatively and demonstrated excellent stimulation and sensor lead function. ?  ?DESCRIPTION OF PROCEDURE:  The patient was identified in the holding room, informed consent having been obtained, including discussion of risks, benefits and alternatives, the patient was brought to the operative suite and put on the operative table in  ?supine position.  Anesthesia was induced and the patient was intubated by the anesthesia team without difficulty.  The patient was given intravenous antibiotics during the case.  The eyes were taped closed and the bed was turned 180 degrees from  ?anesthesia and a shoulder roll was placed.  The right neck and right chest incisions were marked with a marking pen after measuring and injected with 1% lidocaine with 1:100,000 epinephrine.  The nerve integrity monitor was placed in the right lateral  ?tongue and floor of mouth and turned on during the case.  The right neck and chest were prepped and draped in sterile fashion.  The neck incision was made with a 15 blade scalpel and extended through subcutaneous tissue to the platysmal layer using Bovie ? electrocautery.  Dissection was then extended down the platysma layer somewhat until it was divided more inferiorly.  This  allowed direct dissection down onto the lower portion of the submandibular gland and ultimately the digastric tendon.  The tendon  ?was retracted inferiorly with two vessel loops.  Further dissection along the digastric exposed the mylohyoid muscle, which was then retracted anteriorly exposing the hypoglossal nerve.  The fascia over the nerve was then divided using bipolar  ?electrocautery to control bleeding.  The various branches of the nerve were then identified including the C1 branch and the more distal branches off of the main trunk.  Nerve stimulator was then used to identify which branches would be included as  ?protrusion branches and excluded as retrusion branches.  The break point between that on the superior surface of the nerve was then identified and the inclusion portion of the nerve was then elevated allowing pocket for cuff placement.  The cuff was then ? brought into the field and placed around the inclusion branches and properly positioned.  Saline was then injected under the cuff.  The anchor for this lead was then sutured to the digastric tendon using 3-0 silk suture in 2 positions.  The stimulating lead was  ?then fully placed in the neck and covered with a damp gauze.  The infraclavicular incision was made using a 15 blade scalpel and extended through the subcutaneous tissues using Bovie electrocautery down to the pectoralis fascia.  A pocket was then  ?created inferiorly for the generator.  2 stay sutures were then placed at the superior lateral extent of the wound using 2-0 silk suture and these were left in place.  Dissection was then extended through  ?the pectoralis major muscle overlying the  second intercostal space.  Fatty tissue deep to the muscle was swept exposing the external intercostal muscle.  This muscle was dissected more medially exposing the internal intercostal muscle.  A retractor was then gently placed between those muscles running posteriorly and the sensor lead was then  placed into that pocket easily.  The anchor was then sutured with 3-0 silk suture in 3 positions.  The other anchor was then secured on the pectoralis muscle using 3-0 silk in 2 positions.  The neck incision was then uncovered and the lead unraveled.  The tunneling pocket was started with a hemostat under the platysma muscle and extended with the tunneling down over the clavicle to the generator pocket and the stimulating lead was then pulled into  ?the generator pocket with a little bit of slack left in the neck.  Both leads were then cleaned off with damp gauze and dried.  Each one was placed into the generator tightening down the screws to 2 clicks on both occasions.  Both leads were tugged and  ?found to be in good position.  The generator was then positioned into the generator pocket and testing then commenced.  After testing demonstrated good stimulation and good sensor lead function, each wound was copiously irrigated with saline.  The chest wound was closed in the deep tissues with 3-0 Vicryl suture in simple interrupted fashion and in the subcutaneous layer using 4-0 Vicryl suture in a simple interrupted fashion.  The neck incision was closed in the platysma layer with 3-0 Vicryl suture in a  ?simple interrupted fashion and then in the subcutaneous layer using 4-0 Vicryl suture in a simple interrupted fashion.  Both incisions were covered with Dermabond.  The patient was then cleaned off and drapes were removed.  The nerve integrity monitor  ?was removed.  Each incision was then covered with gauze pressure dressing.  The patient was then returned to anesthesia for wakeup and was extubated in the recovery room in stable condition. ? ?

## 2021-09-30 NOTE — Progress Notes (Signed)
IV appeared to infiltrate with Barhemsys administration. Pharmacy contacted and stated to elevate extremity with cold compress.  ?Patient instructed to watch area after discharge and to return to hospital if area increases in redness/inflammation. Report given to Danae Chen, RN for discharge. ?

## 2021-09-30 NOTE — Anesthesia Procedure Notes (Signed)
Procedure Name: Intubation ?Date/Time: 09/30/2021 2:27 PM ?Performed by: Signe Colt, CRNA ?Pre-anesthesia Checklist: Patient identified, Emergency Drugs available, Suction available and Patient being monitored ?Patient Re-evaluated:Patient Re-evaluated prior to induction ?Oxygen Delivery Method: Circle system utilized ?Preoxygenation: Pre-oxygenation with 100% oxygen ?Induction Type: IV induction ?Ventilation: Mask ventilation without difficulty ?Laryngoscope Size: Mac and 3 ?Grade View: Grade II ?Tube type: Oral ?Tube size: 7.0 mm ?Number of attempts: 1 ?Airway Equipment and Method: Stylet and Oral airway ?Placement Confirmation: ETT inserted through vocal cords under direct vision, positive ETCO2 and breath sounds checked- equal and bilateral ?Secured at: 21 cm ?Tube secured with: Tape ?Dental Injury: Teeth and Oropharynx as per pre-operative assessment  ?Difficulty Due To: Difficulty was anticipated ? ? ? ? ?

## 2021-09-30 NOTE — Transfer of Care (Signed)
Immediate Anesthesia Transfer of Care Note ? ?Patient: Jessica Gibson ? ?Procedure(s) Performed: IMPLANTATION OF HYPOGLOSSAL NERVE STIMULATOR (Right: Neck) ? ?Patient Location: PACU ? ?Anesthesia Type:General ? ?Level of Consciousness: awake, alert , oriented and patient cooperative ? ?Airway & Oxygen Therapy: Patient Spontanous Breathing and Patient connected to face mask oxygen ? ?Post-op Assessment: Report given to RN and Post -op Vital signs reviewed and stable ? ?Post vital signs: Reviewed and stable ? ?Last Vitals:  ?Vitals Value Taken Time  ?BP 142/68 09/30/21 1630  ?Temp    ?Pulse 71 09/30/21 1631  ?Resp 17 09/30/21 1631  ?SpO2 93 % 09/30/21 1631  ?Vitals shown include unvalidated device data. ? ?Last Pain:  ?Vitals:  ? 09/30/21 1143  ?TempSrc: Oral  ?PainSc: 0-No pain  ?   ? ?Patients Stated Pain Goal: 3 (09/30/21 1143) ? ?Complications: No notable events documented. ?

## 2021-09-30 NOTE — Discharge Instructions (Addendum)
?  Post Anesthesia Home Care Instructions ? ?Activity: ?Get plenty of rest for the remainder of the day. A responsible individual must stay with you for 24 hours following the procedure.  ?For the next 24 hours, DO NOT: ?-Drive a car ?-Paediatric nurse ?-Drink alcoholic beverages ?-Take any medication unless instructed by your physician ?-Make any legal decisions or sign important papers. ? ?Meals: ?Start with liquid foods such as gelatin or soup. Progress to regular foods as tolerated. Avoid greasy, spicy, heavy foods. If nausea and/or vomiting occur, drink only clear liquids until the nausea and/or vomiting subsides. Call your physician if vomiting continues. ? ?Special Instructions/Symptoms: ?Your throat may feel dry or sore from the anesthesia or the breathing tube placed in your throat during surgery. If this causes discomfort, gargle with warm salt water. The discomfort should disappear within 24 hours. ? ?If you had a scopolamine patch placed behind your ear for the management of post- operative nausea and/or vomiting: ? ?1. The medication in the patch is effective for 72 hours, after which it should be removed.  Wrap patch in a tissue and discard in the trash. Wash hands thoroughly with soap and water. ?2. You may remove the patch earlier than 72 hours if you experience unpleasant side effects which may include dry mouth, dizziness or visual disturbances. ?3. Avoid touching the patch. Wash your hands with soap and water after contact with the patch. ?    ? ?Next dose of NSAID (Ibuprofen/Aleve/Motrin) can be given at 11:00pm if needed. ?

## 2021-09-30 NOTE — Brief Op Note (Signed)
09/30/2021 ? ?4:13 PM ? ?PATIENT:  CHONG JANUARY  76 y.o. female ? ?PRE-OPERATIVE DIAGNOSIS:  Obstructive Sleep Apnea ? ?POST-OPERATIVE DIAGNOSIS:  Obstructive Sleep Apnea ? ?PROCEDURE:  Procedure(s): ?IMPLANTATION OF HYPOGLOSSAL NERVE STIMULATOR (Right) ? ?SURGEON:  Surgeon(s) and Role: ?   Melida Quitter, MD - Primary ? ?PHYSICIAN ASSISTANT: Sallee Provencal ? ?ASSISTANTS: none  ? ?ANESTHESIA:   general ? ?EBL:  35 mL  ? ?BLOOD ADMINISTERED:none ? ?DRAINS: none  ? ?LOCAL MEDICATIONS USED:  LIDOCAINE  ? ?SPECIMEN:  No Specimen ? ?DISPOSITION OF SPECIMEN:  N/A ? ?COUNTS:  YES ? ?TOURNIQUET:  * No tourniquets in log * ? ?DICTATION: .Note written in EPIC ? ?PLAN OF CARE: Discharge to home after PACU ? ?PATIENT DISPOSITION:  PACU - hemodynamically stable. ?  ?Delay start of Pharmacological VTE agent (>24hrs) due to surgical blood loss or risk of bleeding: no ? ?

## 2021-09-30 NOTE — Anesthesia Preprocedure Evaluation (Addendum)
Anesthesia Evaluation  ?Patient identified by MRN, date of birth, ID band ?Patient awake ? ? ? ?Reviewed: ?Allergy & Precautions, NPO status , Patient's Chart, lab work & pertinent test results ? ?History of Anesthesia Complications ?(+) PONV and history of anesthetic complications ? ?Airway ?Mallampati: III ? ?TM Distance: >3 FB ?Neck ROM: Full ? ? ? Dental ?no notable dental hx. ? ?  ?Pulmonary ?sleep apnea ,  ?  ?Pulmonary exam normal ? ? ? ? ? ? ? Cardiovascular ?hypertension, Pt. on medications and Pt. on home beta blockers ?Normal cardiovascular exam ? ? ?  ?Neuro/Psych ?vertigo ?negative psych ROS  ? GI/Hepatic ?negative GI ROS, Neg liver ROS,   ?Endo/Other  ?negative endocrine ROS ? Renal/GU ?negative Renal ROS  ? ?  ?Musculoskeletal ? ?(+) Arthritis ,  ? Abdominal ?  ?Peds ? Hematology ?negative hematology ROS ?(+)   ?Anesthesia Other Findings ?Obstructive Sleep Apnea ? Reproductive/Obstetrics ? ?  ? ? ? ? ? ? ? ? ? ? ? ? ? ?  ?  ? ? ? ? ? ? ? ?Anesthesia Physical ?Anesthesia Plan ? ?ASA: 3 ? ?Anesthesia Plan: General  ? ?Post-op Pain Management:   ? ?Induction: Intravenous ? ?PONV Risk Score and Plan: 4 or greater and Ondansetron, Dexamethasone, Propofol infusion and Treatment may vary due to age or medical condition ? ?Airway Management Planned: Oral ETT ? ?Additional Equipment:  ? ?Intra-op Plan:  ? ?Post-operative Plan: Extubation in OR ? ?Informed Consent: I have reviewed the patients History and Physical, chart, labs and discussed the procedure including the risks, benefits and alternatives for the proposed anesthesia with the patient or authorized representative who has indicated his/her understanding and acceptance.  ? ? ? ?Dental advisory given ? ?Plan Discussed with: CRNA ? ?Anesthesia Plan Comments:   ? ? ? ? ? ?Anesthesia Quick Evaluation ? ?

## 2021-10-01 ENCOUNTER — Encounter (HOSPITAL_BASED_OUTPATIENT_CLINIC_OR_DEPARTMENT_OTHER): Payer: Self-pay | Admitting: Otolaryngology

## 2021-10-01 NOTE — Anesthesia Postprocedure Evaluation (Signed)
Anesthesia Post Note ? ?Patient: Jessica Gibson ? ?Procedure(s) Performed: IMPLANTATION OF HYPOGLOSSAL NERVE STIMULATOR (Right: Neck) ? ?  ? ?Patient location during evaluation: PACU ?Anesthesia Type: General ?Level of consciousness: awake ?Pain management: pain level controlled ?Vital Signs Assessment: post-procedure vital signs reviewed and stable ?Respiratory status: spontaneous breathing, nonlabored ventilation, respiratory function stable and patient connected to nasal cannula oxygen ?Cardiovascular status: blood pressure returned to baseline and stable ?Postop Assessment: no apparent nausea or vomiting ?Anesthetic complications: no ? ? ?No notable events documented. ? ?Last Vitals:  ?Vitals:  ? 09/30/21 1715 09/30/21 1754  ?BP: 135/64 138/61  ?Pulse: 73 67  ?Resp: 17 16  ?Temp:  36.6 ?C  ?SpO2: 97% 94%  ?  ?Last Pain:  ?Vitals:  ? 09/30/21 1708  ?TempSrc:   ?PainSc: 2   ? ? ?  ?  ?  ?  ?  ?  ? ?Dilynn Munroe P Masayoshi Couzens ? ? ? ? ?

## 2021-11-20 ENCOUNTER — Telehealth: Payer: Self-pay

## 2021-11-20 ENCOUNTER — Ambulatory Visit (INDEPENDENT_AMBULATORY_CARE_PROVIDER_SITE_OTHER): Payer: Medicare Other | Admitting: Neurology

## 2021-11-20 DIAGNOSIS — Z789 Other specified health status: Secondary | ICD-10-CM | POA: Diagnosis not present

## 2021-11-20 DIAGNOSIS — Z9682 Presence of neurostimulator: Secondary | ICD-10-CM | POA: Insufficient documentation

## 2021-11-20 DIAGNOSIS — Z9889 Other specified postprocedural states: Secondary | ICD-10-CM | POA: Diagnosis not present

## 2021-11-20 NOTE — Progress Notes (Addendum)
SLEEP MEDICINE CLINIC    Provider:  Larey Seat, MD  Primary Care Physician:  Donnajean Lopes, [address] [place] [address]     Referring Provider:  Dr Redmond Baseman, MD    Donnajean Lopes, [address] [place],  [place] [id number]          Chief Complaint according to patient   Patient presents with:     New Patient (Initial Visit)     Question about inspire after nasal surgery and weight loss, can't tolerate CPAP       HISTORY OF PRESENT ILLNESS:  Jessica Gibson is a 76 y.o. Caucasian female patient seen here upon a requested sleep Consultation per Dr Redmond Baseman, MD, ENT.  Seen on 11/20/2021 with Inspire after implant on  09-30-2021 of this device, now here to ACTIVATE: Set today between 0.9 - 1.9 V . Surgical recovery was quick, but she reports itching  on the surgical site. No swelling. She then started to note some pus 10 days ago- using alcohol rubbing pads.                04-23-2021 , HST for this [name] interested  patient  to evaluate apnea type and degree and expected therapy effect. CPAP intolerant patient who had nasal surgery.    Epworth sleepiness score: 6/24.   BMI: 30 kg/m   Neck Circumference: 15 inches         Percent REM (%): 24.6%                                       Respiratory Indices:   Calculated pAHI (per hour): The calculated AHI by 4 % desaturation was 18.9/h and by 3% desaturation 32.9/h . In REM sleep AHI was 51.8/h and in non-REM sleep 26.7/h.                                                Positional AHI: In supine sleep the AHI was 65.2/h sleeping on the right side 29.8/h and on the left 26.7/h. Snoring was of moderate volume with a mean volume of 41 dB and accompanied 34% of total sleep time.                                                 Oxygen Saturation Statistics:                  O2 Saturation Range (%): Between a nadir at 82% and a maximum of 98% the mean oxygenation was 92%.  There were only 6.4 minutes  of hypoxia noted which is clinically irrelevant..                                     O2 Saturation (minutes) <89%:6.4 minutes.           Pulse Rate Statistics: Pulse Range:   Between 50 and 80 bpm with a mean heart rate of 58 bpm.              IMPRESSION:  REM exacerbated OSA- This HST confirms the presence of ongoing severe sleep apnea /obstructive in nature and clustering in REM sleep.  Since REM sleep was concentrated after 2:30 AM there were many more apneas and hypopneas noted in that the later half of the study.   The patient slept very little in supine sleep a total of 55 minutes and I would advise her to avoid supine sleep as much as possible. No hypoxemia was present.      RECOMMENDATION:I expect that only the non-REM sleep apnea could be reduced by Inspire. This still would account for over 50 % reduction in apnea. Avoiding supine sleep may further reduce the overall AHI to mild degree apnea, AHI 15 or below.    INTERPRETING PHYSICIAN:    Larey Seat, MD    Medical Director of [facility] Sleep at [facility].               Marland Kitchen  Chief concern according to patient :   Paper referral for alternative treatment for OSA. Pt is interested in getting Inspire. Pt had SS done here in 2015 and 2019. Was last seen in our office on 11/09/17 for RV as well.  She recently has nasal surgery with ENT Dr Redmond Baseman, and she feels she already sleeps better.   Jessica Gibson has a past medical history of Arthritis, Atrophic vaginitis, CIN I (cervical intraepithelial neoplasia I) (03/2004), Heart murmur, History of kidney stones, vertigo, Hypercholesteremia, Hypertension, Nasal turbinate hypertrophy, OSA (obstructive sleep apnea), Other malaise and fatigue (02/28/2014), Post-operative nausea and vomiting, and Snoring (02/28/2014).   The patient had the first sleep study on April 05, 2014 at the time referred by her primary care physician Dr. Bevelyn Buckles.  The patient had a low sleep efficiency 60.9% only she did  have a mild form of obstructive sleep apnea 12.9 her RDI was 24.2 which means that she was a loud snorer.  This was not REM dependent apnea with a REM sleep AHI of 29/h non-REM sleep AHI of 11.1.  There was also a lot of supine apnea exacerbation.  Supine AHI was 28.5 versus nonsupine 9.1.  She did only have 1612 2 minutes of desaturation which is not clinically significant.  She then had a new sleep study on 3-22 2019 there were no periodic limb movements the patient slept very well for the last two thirds of the night but she preferred to sleep on her back.  She was titrated to CPAP beginning at 5 and ending at 6 cmH2O and her RDI or AHI reached 1.9.  She now underwent nasal surgery and feels that since she can breathe easier through her nose she may no longer have the same degree of sleep apnea or type of sleep apnea and also if sleep apnea is still present she would like to be evaluated if she is a valid candidate for the inspire device.  She endorsed the Epworth sleepiness score at 6 out of 24 points fatigue severity was low at 17.  Her geriatric depression score was endorsed at only 1 out of 15 points.  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     Sleep relevant medical history: Nocturia once, no RLS, averages 7 hours of sleep, no naps in daytime.  Nasal surgery recently restored her nasal patency. The preferred sleep position is sideways- horseshoe pillow- Dreams are reportedly frequent/vivid.  8  AM is the usual rise time. The patient wakes up spontaneously.  She reports not feeling refreshed or restored in AM, with symptoms such as dry mouth, morning headaches, and residual fatigue. This improved after nasal surgery-    Review of Systems: Out of a complete 14 system review, the patient complains of only the following symptoms, and all other reviewed systems are negative.:      How likely are you to doze in the following situations: 0 = not likely, 1 = slight chance, 2 = moderate chance, 3 = high chance   Sitting and Reading? Watching Television? Sitting inactive in a public place (theater or meeting)? As a passenger in a car for an hour without a break? Lying down in the afternoon when circumstances permit? Sitting and talking to someone? Sitting quietly after lunch without alcohol? In a car, while stopped for a few minutes in traffic?   Total = 6/ 24 points   FSS endorsed at 17/ 63 points.   Social History   Socioeconomic History   Marital status: Widowed    Spouse name: Not on file   Number of children: 2   Years of education: College   Highest education level: Not on file  Occupational History    Employer: SELF EMPLOYED  Tobacco Use   Smoking status: Never   Smokeless tobacco: Never  Vaping Use   Vaping Use: Never used  Substance and Sexual Activity   Alcohol use: Yes    Alcohol/week: 0.0 standard drinks of alcohol    Comment: 2 drinks/month   Drug use: No   Sexual activity: Not Currently    Birth control/protection: Post-menopausal    Comment: 1st intercourse 76 yo-Fewer than 5 partners  Other Topics Concern   Not on file  Social History Narrative   Patient is widowed and lives alone.   Patient has two adult children.   Patient is self-employed, semi-retired.   Patient is right-handed.   Patient drinks three cups of coffee daily.   Patient has some college education.   Social Determinants of Health   Financial Resource Strain: Not on file  Food Insecurity: Not on file  Transportation Needs: Not on file  Physical Activity: Not on file  Stress: Not on file  Social Connections: Not on file    Family History  Problem Relation Age of Onset   Diabetes Mother    Hypertension Mother    Heart disease Mother    Heart disease Father    Other Father        circulation problems   Colon cancer Neg Hx     Past Medical  History:  Diagnosis Date   Arthritis    "wrist, shoulders; its just there "   Atrophic vaginitis    CIN I (cervical intraepithelial neoplasia I) 03/2004   LEEP   Heart murmur    dx in childohood; " i have a small hole in my hart but it doesnt do anything"    History of kidney stones    "i had 1 kidney at age 2 and at that time they went up, put a basket and retrieved "    Hx of vertigo    "3 to 4 months of it; but currently no symptoms becasue i used the epley maneuver"    Hypercholesteremia    cant tolerate statins; just watching diet ; currently on weight watchers   Hypertension    Nasal turbinate hypertrophy    dx per Dr. Redmond Baseman ; uses nasal  spray for relief    OSA (obstructive sleep apnea)    initial dx in 2015 as mild; retest 07-09-17 dx with SEVERE OSA ; no current CPAP use, per Neuro Dr Maureen Chatters plan to complete CPAP titration (see 07-19-17 telephone note in epic)   Other malaise and fatigue 02/28/2014   Post-operative nausea and vomiting    Snoring 02/28/2014    Past Surgical History:  Procedure Laterality Date   ARTHROSCOPY OF KNEE Right    same practice as Dr Brunetta [facility] now [facility]    CERVICAL BIOPSY  W/ LOOP ELECTRODE EXCISION  2005   done with Dr Deeann Cree ; [place]. same practice as Dr Phineas Real; reports : " i guess it was benign because we didnt have to do anything"    [place] N/A 08/25/2017   Procedure: [place];  Surgeon: Anastasio Auerbach, MD;  Location: [facility];  Service: Gynecology;  Laterality: N/A;  request to follow around 10:15am in [facility] block time. requests one hour OR time.   DRUG INDUCED ENDOSCOPY N/A 12/06/2020   Procedure: DRUG INDUCED ENDOSCOPY;  Surgeon: Melida Quitter, MD;  Location: [facility];  Service: ENT;  Laterality: N/A;   IMPLANTATION OF HYPOGLOSSAL NERVE STIMULATOR Right 09/30/2021   Procedure: IMPLANTATION OF  HYPOGLOSSAL NERVE STIMULATOR;  Surgeon: Melida Quitter, MD;  Location: [facility];  Service: ENT;  Laterality: Right;   NASAL TURBINATE REDUCTION Bilateral 12/06/2020   Procedure: TURBINATE REDUCTION/SUBMUCOSAL RESECTION;  Surgeon: Melida Quitter, MD;  Location: [facility];  Service: ENT;  Laterality: Bilateral;   REPLACEMENT TOTAL KNEE Right 2009   dr Wynelle Link      Current Outpatient Medications on File Prior to Visit  Medication Sig Dispense Refill   acetaminophen (TYLENOL) 650 MG CR tablet Take 650 mg by mouth every 8 (eight) hours as needed for pain.     amLODipine (NORVASC) 10 MG tablet Take 10 mg by mouth daily.     amoxicillin (AMOXIL) 500 MG tablet Take 500 mg by mouth as needed (takes 4 tablets one hour before dental appts).     HYDROcodone-acetaminophen (NORCO) 7.5-325 MG tablet Take 1 tablet by mouth every 6 (six) hours as needed for moderate pain. 12 tablet 0   ibuprofen (ADVIL,MOTRIN) 200 MG tablet Take 400 mg by mouth every 6 (six) hours as needed for moderate pain (pain).     losartan-hydrochlorothiazide (HYZAAR) 100-12.5 MG tablet Take 1 tablet by mouth daily.     metoprolol tartrate (LOPRESSOR) 50 MG tablet Take 50 mg by mouth 2 (two) times daily.     minoxidil (LONITEN) 10 MG tablet Take 10 mg by mouth 2 (two) times daily.     No current facility-administered medications on file prior to visit.    Allergies  Allergen Reactions   Ezetimibe Other (See Comments)    Joint pain   Labetalol Hcl Other (See Comments)    Took way taste   Other Other (See Comments)   Pravastatin Other (See Comments)    Joint pain    Physical exam:  Wt Readings from Last 3 Encounters:  09/30/21 160 lb 15 oz (73 kg)  04/23/21 160 lb 8 oz (72.8 kg)  12/06/20 162 lb (73.5 kg)     Ht Readings from Last 3 Encounters:  09/30/21 '5\' 2"'$  (1.575 m)  04/23/21 '5\' 2"'$  (1.575 m)  12/06/20 '5\' 1"'$  (1.549 m)      General: The  patient is awake, alert and appears not in acute distress. The patient  is well groomed. Head: Normocephalic, atraumatic. Neck is supple.  Mallampati 2,  neck circumference:15 inches   Nasal airflow improved,  patent.  Retrognathia is not seen.  Cardiovascular:  Regular rate and cardiac rhythm by pulse,  without distended neck veins. Respiratory: Lungs are clear to auscultation.  Skin:  Without evidence of ankle edema, or rash. Trunk: The patient's posture is erect.   Neurologic exam : The patient is awake and alert, oriented to place and time.   Memory subjective described as intact.  Attention span & concentration ability appears normal.  Speech is fluent,  without  dysarthria, dysphonia or aphasia.  Mood and affect are appropriate.   Cranial nerves: no loss of smell or taste reported  Pupils are equal and briskly reactive to light. Funduscopic exam deferred, beginning cataracts. .  Extraocular movements in vertical and horizontal planes were intact and without nystagmus. No Diplopia. Visual fields by finger perimetry are intact. Status post hypoglossal nerve stimulator implantation following sleep study from 05-14-2021, Implantation 10-01-2021.   1) still uncontrolled HTN after weight loss and  likely change in OSA since nasal surgery after she was not able to tolerate CPAP.   I explained the type of apnea than can benefit from inspire.  The patient has opted for INSPIRE and was here for activation.   Her morning headaches and dry mouth and fatigue  have improved post surgery. Her sleep is more restorative and refreshing.   I would like to thank Redmond Baseman, MD and Donnajean Lopes, [place],  [place] [address] for allowing me to meet with and to take care of this pleasant patient. She will receive a follow up visit after she advanced the inspire Voltage weekly at home.   I plan to follow up  after titration study in 3- 02-19-2022 either personally or through our NP within 2-4 month.  Electronically signed by: Larey Seat, MD 11/20/2021  1:44 PM  [facility] Board certified by The [employer] of Sleep Medicine and Diplomate of the [employer] of Sleep Medicine. Board certified In Neurology through the [employer], Fellow of the [employer] of Neurology. Medical Director of [employer].

## 2021-12-06 HISTORY — PX: OTHER SURGICAL HISTORY: SHX169

## 2022-01-14 DIAGNOSIS — L570 Actinic keratosis: Secondary | ICD-10-CM | POA: Diagnosis not present

## 2022-01-14 DIAGNOSIS — D225 Melanocytic nevi of trunk: Secondary | ICD-10-CM | POA: Diagnosis not present

## 2022-01-14 DIAGNOSIS — L814 Other melanin hyperpigmentation: Secondary | ICD-10-CM | POA: Diagnosis not present

## 2022-01-14 DIAGNOSIS — Z85828 Personal history of other malignant neoplasm of skin: Secondary | ICD-10-CM | POA: Diagnosis not present

## 2022-01-14 DIAGNOSIS — L821 Other seborrheic keratosis: Secondary | ICD-10-CM | POA: Diagnosis not present

## 2022-01-14 DIAGNOSIS — D1801 Hemangioma of skin and subcutaneous tissue: Secondary | ICD-10-CM | POA: Diagnosis not present

## 2022-01-14 DIAGNOSIS — D2271 Melanocytic nevi of right lower limb, including hip: Secondary | ICD-10-CM | POA: Diagnosis not present

## 2022-01-26 DIAGNOSIS — H40053 Ocular hypertension, bilateral: Secondary | ICD-10-CM | POA: Diagnosis not present

## 2022-01-26 DIAGNOSIS — H524 Presbyopia: Secondary | ICD-10-CM | POA: Diagnosis not present

## 2022-01-26 DIAGNOSIS — H2513 Age-related nuclear cataract, bilateral: Secondary | ICD-10-CM | POA: Diagnosis not present

## 2022-01-26 DIAGNOSIS — H25013 Cortical age-related cataract, bilateral: Secondary | ICD-10-CM | POA: Diagnosis not present

## 2022-02-05 ENCOUNTER — Ambulatory Visit (INDEPENDENT_AMBULATORY_CARE_PROVIDER_SITE_OTHER): Payer: Medicare Other | Admitting: Neurology

## 2022-02-05 ENCOUNTER — Telehealth: Payer: Self-pay

## 2022-02-05 ENCOUNTER — Encounter: Payer: Self-pay | Admitting: Neurology

## 2022-02-05 VITALS — Ht 62.0 in | Wt 158.5 lb

## 2022-02-05 DIAGNOSIS — G478 Other sleep disorders: Secondary | ICD-10-CM | POA: Diagnosis not present

## 2022-02-05 DIAGNOSIS — J300 Vasomotor rhinitis: Secondary | ICD-10-CM

## 2022-02-05 DIAGNOSIS — Z9682 Presence of neurostimulator: Secondary | ICD-10-CM

## 2022-02-05 NOTE — Progress Notes (Addendum)
SLEEP MEDICINE CLINIC    Provider:  Larey Seat, MD  Primary Care Physician:  Donnajean Lopes, [place] [address]     Referring Provider:  Dr Redmond Baseman, MD    Donnajean Lopes, [address] [place],  [place] [address]          Chief Complaint according to patient   Patient presents with:     New Patient (Initial Visit)           HISTORY OF PRESENT ILLNESS:  Jessica Gibson is a 75 y.o. Caucasian female patient seen here on 02-05-2022. Reporting still uncontrolled HTN after weight loss and  likely change in OSA since nasal surgery after she was not able to tolerate CPAP. The patient had opted for INSPIRE,  implanted on 10-14-2021 by Dr Redmond Baseman,  and was here for activation. 11-20-2021  She was instructed to slowly work her way up  in increments of 0.1 V per week, as tolerated. She brought her INSPIRE  home sleep diary with her, is highly compliant, her data download showed the weekly increase in voltage - which she has tolerated very well. Level 10, reached on 01-22-2022.  She noted at first improvement on level 4-5 . She feels she sleeps deeper, less fragmented and with more restorative quality.  She demonstrated the tongue movement here.  She will have a sleep study with INSPIRE titration on 11-19-2021 at [facility].  She reported she no longer takes hydrocodone , is on Lopressor, minoxidil, norvasc.  Epworth now 5 points, more energy and longer into the day, FSS at 13/ 63 , no depression.   I have copied the current settings and compliance below. : CD   Jessica Gibson has a past medical history of Arthritis, Atrophic vaginitis, CIN I (cervical intraepithelial neoplasia I) (03/2004), Heart murmur, History of kidney stones, vertigo, Hypercholesteremia, Hypertension, Nasal turbinate hypertrophy, OSA (obstructive sleep apnea), Other malaise and fatigue (02/28/2014), Post-operative nausea and vomiting, and Snoring (02/28/2014).                                      Note        Recent Patient Communication   Last Update Reason Specialty    Today -- Sleep Medicine    [facility]       11-20-2021:  Seen upon a requested sleep Consultation per Dr Redmond Baseman, MD, ENT.   now here to ACTIVATE:PCP Dr. Philip Aspen, MD.  Set today between 0.9 - 1.9 V . Surgical recovery was quick, but she reports itching  on the surgical site. No swelling. She then started to note some pus 10 days ago- using alcohol rubbing pads.                04-23-2021 , HST for this Jessica Gibson interested  patient  to evaluate apnea type and degree and expected therapy effect. CPAP intolerant patient who had nasal surgery.    Epworth sleepiness score: 6/24.   BMI: 30 kg/m   Neck Circumference: 15 inches         Percent REM (%): 24.6%  Respiratory Indices:   Calculated pAHI (per hour): The calculated AHI by 4 % desaturation was 18.9/h and by 3% desaturation 32.9/h . In REM sleep AHI was 51.8/h and in non-REM sleep 26.7/h.                                                Positional AHI: In supine sleep the AHI was 65.2/h sleeping on the right side 29.8/h and on the left 26.7/h. Snoring was of moderate volume with a mean volume of 41 dB and accompanied 34% of total sleep time.                                                 Oxygen Saturation Statistics:                  O2 Saturation Range (%): Between a nadir at 82% and a maximum of 98% the mean oxygenation was 92%.  There were only 6.4 minutes of hypoxia noted which is clinically irrelevant..                                     O2 Saturation (minutes) <89%:6.4 minutes.           Pulse Rate Statistics: Pulse Range:   Between 50 and 80 bpm with a mean heart rate of 58 bpm.              IMPRESSION: REM exacerbated OSA- This HST confirms the presence  of ongoing severe sleep apnea /obstructive in nature and clustering in REM sleep.  Since REM sleep was concentrated after 2:30 AM there were many more apneas and hypopneas noted in that the later half of the study.   The patient slept very little in supine sleep a total of 55 minutes and I would advise her to avoid supine sleep as much as possible. No hypoxemia was present.      RECOMMENDATION:I expect that only the non-REM sleep apnea could be reduced by Inspire. This still would account for over 50 % reduction in apnea. Avoiding supine sleep may further reduce the overall AHI to mild degree apnea, AHI 15 or below.    INTERPRETING PHYSICIAN:    Larey Seat, MD    Medical Director of [facility] at [facility].               Marland Kitchen  Chief concern according to patient :   Paper referral for alternative treatment for OSA. Pt is interested in getting Inspire. Pt had SS done here in 2015 and 2019. Was last seen in our office on 11/09/17 for RV as well.  She recently has nasal surgery with ENT Dr Redmond Baseman, and she feels she already sleeps better.   The patient had the first sleep study on April 05, 2014 at the time referred by her primary care physician Dr. Bevelyn Buckles.  The patient had a low sleep efficiency 60.9% only she did have a mild form of obstructive sleep apnea 12.9 her RDI was 24.2 which means that she was a loud snorer.  This was not REM dependent apnea with a REM sleep AHI of 29/h non-REM sleep  AHI of 11.1.  There was also a lot of supine apnea exacerbation.  Supine AHI was 28.5 versus nonsupine 9.1.  She did only have 1612 2 minutes of desaturation which is not clinically significant.  She then had a new sleep study on 3-22 2019 there were no periodic limb movements the patient slept very well for the last two thirds of the night but she preferred to sleep on her back.  She was titrated to CPAP beginning at 5 and ending at 6 cmH2O and her RDI or AHI reached 1.9.  She now underwent nasal  surgery and feels that since she can breathe easier through her nose she may no longer have the same degree of sleep apnea or type of sleep apnea and also if sleep apnea is still present she would like to be evaluated if she is a valid candidate for the inspire device.  She endorsed the Epworth sleepiness score at 6 out of 24 points fatigue severity was low at 17.  Her geriatric depression score was endorsed at only 1 out of 15 points.  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     Sleep relevant medical history: Nocturia once, no RLS, averages 7 hours of sleep, no naps in daytime.  Nasal surgery recently restored her nasal patency. The preferred sleep position is sideways- horseshoe pillow- Dreams are reportedly frequent/vivid.  8  AM is the usual rise time. The patient wakes up spontaneously.  She reports not feeling refreshed or restored in AM, with symptoms such as dry mouth, morning headaches, and residual fatigue. This improved after nasal surgery-    Review of Systems: Out of a complete 14 system review, the patient complains of only the following symptoms, and all other reviewed systems are negative.:     How likely are you to doze in the following situations: 0 = not likely, 1 = slight chance, 2 = moderate chance, 3 = high chance   Sitting and Reading? Watching Television? Sitting inactive in a public place (theater or meeting)? As a passenger in a car for an hour without a break? Lying down in the afternoon when circumstances permit? Sitting and talking to someone? Sitting quietly after lunch without alcohol? In a car, while stopped for a few minutes in traffic?   Total = 5 from 6/ 24 points   FSS endorsed at  13 from 17/ 63 points.   Social History   Socioeconomic History   Marital status: Widowed    Spouse name: Not on file   Number of children: 2    Years of education: College   Highest education level: Not on file  Occupational History    Employer: SELF EMPLOYED  Tobacco Use   Smoking status: Never   Smokeless tobacco: Never  Vaping Use   Vaping Use: Never used  Substance and Sexual Activity   Alcohol use: Yes    Alcohol/week: 0.0 standard drinks of alcohol    Comment: 2 drinks/month   Drug use: No   Sexual activity: Not Currently    Birth control/protection: Post-menopausal    Comment: 1st intercourse 76 yo-Fewer than 5 partners  Other Topics Concern   Not on file  Social History Narrative   Patient is widowed and lives alone.   Patient has two adult children.   Patient is self-employed, semi-retired.   Patient is right-handed.   Patient drinks three  cups of coffee daily.   Patient has some college education.   Social Determinants of Health   Financial Resource Strain: Not on file  Food Insecurity: Not on file  Transportation Needs: Not on file  Physical Activity: Not on file  Stress: Not on file  Social Connections: Not on file    Family History  Problem Relation Age of Onset   Diabetes Mother    Hypertension Mother    Heart disease Mother    Heart disease Father    Other Father        circulation problems   Colon cancer Neg Hx     Past Medical History:  Diagnosis Date   Arthritis    "wrist, shoulders; its just there "   Atrophic vaginitis    CIN I (cervical intraepithelial neoplasia I) 03/2004   LEEP   Heart murmur    dx in childohood; " i have a small hole in my hart but it doesnt do anything"    History of kidney stones    "i had 1 kidney at age 17 and at that time they went up, put a basket and retrieved "    Hx of vertigo    "3 to 4 months of it; but currently no symptoms becasue i used the epley maneuver"    Hypercholesteremia    cant tolerate statins; just watching diet ; currently on weight watchers   Hypertension    Nasal turbinate hypertrophy    dx per Dr. Redmond Baseman ; uses nasal spray  for relief    OSA (obstructive sleep apnea)    initial dx in 2015 as mild; retest 07-09-17 dx with SEVERE OSA ; no current CPAP use, per Neuro Dr Maureen Chatters plan to complete CPAP titration (see 07-19-17 telephone note in epic)   Other malaise and fatigue 02/28/2014   Post-operative nausea and vomiting    Snoring 02/28/2014    Past Surgical History:  Procedure Laterality Date   ARTHROSCOPY OF KNEE Right    same practice as Dr Brunetta [facility] now [facility]    CERVICAL BIOPSY  W/ LOOP ELECTRODE EXCISION  2005   done with Dr Deeann Cree ; [place]. same practice as Dr Phineas Real; reports : " i guess it was benign because we didnt have to do anything"    [place] N/A 08/25/2017   Procedure: DILATATION & CURETTAGE/HYSTEROSCOPY WITH MYOSURE;  Surgeon: Anastasio Auerbach, MD;  Location: [facility];  Service: Gynecology;  Laterality: N/A;  request to follow around 10:15am in [facility] block time. requests one hour OR time.   DRUG INDUCED ENDOSCOPY N/A 12/06/2020   Procedure: DRUG INDUCED ENDOSCOPY;  Surgeon: Melida Quitter, MD;  Location: [facility];  Service: ENT;  Laterality: N/A;   IMPLANTATION OF HYPOGLOSSAL NERVE STIMULATOR Right 09/30/2021   Procedure: IMPLANTATION OF HYPOGLOSSAL NERVE STIMULATOR;  Surgeon: Melida Quitter, MD;  Location: [facility];  Service: ENT;  Laterality: Right;   NASAL TURBINATE REDUCTION Bilateral 12/06/2020   Procedure: TURBINATE REDUCTION/SUBMUCOSAL RESECTION;  Surgeon: Melida Quitter, MD;  Location: [facility];  Service: ENT;  Laterality: Bilateral;   REPLACEMENT TOTAL KNEE Right 2009   dr Wynelle Link      Current Outpatient Medications on File Prior to Visit  Medication Sig Dispense Refill   acetaminophen (TYLENOL) 650 MG CR tablet Take 650 mg by mouth every 8 (eight) hours as needed for pain.     amLODipine (NORVASC) 10 MG  tablet Take 10 mg by mouth daily.     HYDROcodone-acetaminophen  (NORCO) 7.5-325 MG tablet Take 1 tablet by mouth every 6 (six) hours as needed for moderate pain. 12 tablet 0   ibuprofen (ADVIL,MOTRIN) 200 MG tablet Take 400 mg by mouth every 6 (six) hours as needed for moderate pain (pain).     losartan-hydrochlorothiazide (HYZAAR) 100-12.5 MG tablet Take 1 tablet by mouth daily.     metoprolol tartrate (LOPRESSOR) 50 MG tablet Take 50 mg by mouth 2 (two) times daily.     minoxidil (LONITEN) 10 MG tablet Take 10 mg by mouth 2 (two) times daily.     No current facility-administered medications on file prior to visit.    Allergies  Allergen Reactions   Ezetimibe Other (See Comments)    Joint pain   Labetalol Hcl Other (See Comments)    Took way taste   Other Other (See Comments)   Pravastatin Other (See Comments)    Joint pain    Physical exam:  Wt Readings from Last 3 Encounters:  02/05/22 158 lb 8 oz (71.9 kg)  09/30/21 160 lb 15 oz (73 kg)  04/23/21 160 lb 8 oz (72.8 kg)     Ht Readings from Last 3 Encounters:  02/05/22 '5\' 2"'$  (1.575 m)  09/30/21 '5\' 2"'$  (1.575 m)  04/23/21 '5\' 2"'$  (1.575 m)      General: The patient is awake, alert and appears not in acute distress. The patient is well groomed. Head: Normocephalic, atraumatic. Neck is supple.  Mallampati 2,  neck circumference:15 inches   Nasal airflow improved,  patent.  Retrognathia is not seen.  Cardiovascular:  Regular rate and cardiac rhythm by pulse,  without distended neck veins. Respiratory: Lungs are clear to auscultation.  Skin:  Without evidence of ankle edema, or rash. Trunk: The patient's posture is erect.   Neurologic exam : The patient is awake and alert, oriented to place and time.   Memory subjective described as intact.  Attention span & concentration ability appears normal.  Speech is fluent,  without  dysarthria, dysphonia or aphasia.  Mood and affect are appropriate.   Cranial nerves: no loss of smell or taste reported  Pupils are equal and briskly reactive to  light. Funduscopic exam deferred, beginning cataracts. .  Extraocular movements in vertical and horizontal planes were intact and without nystagmus. No Diplopia. Visual fields by finger perimetry are intact.  6 days ago                Note       ASSESSMENT : Status post hypoglossal nerve stimulator implantation following sleep study from 05-14-2021, Implantation 10-01-2021.  Patient has tolerated surgery and tolerated in pressure Voltage up to level 10 now, reached on 01-22-2022.    1) Her morning headaches and dry mouth and fatigue  have improved post surgery. Her sleep is more restorative and refreshing. 2) She still has some insomnia nights.   I would like to thank DR. Redmond Baseman, MD and Donnajean Lopes, [place] [place],  [place] [address] for allowing me to meet with and to take care of this pleasant patient. She will receive a follow up visit after she advanced the inspire Voltage weekly at home.    PLAN :  I plan to follow up  after titration study in 02-19-2022 either personally or through our NP within 2-4 months.  Electronically signed by: Larey Seat, MD 02/05/2022 1:10 PM  [facility] and [facility] Sleep Board  certified by The [employer] of Sleep Medicine and Diplomate of the [employer] of Sleep Medicine. Board certified In Neurology through the [place], Fellow of the [employer] of Neurology. Medical Director of [employer].

## 2022-02-19 ENCOUNTER — Ambulatory Visit (INDEPENDENT_AMBULATORY_CARE_PROVIDER_SITE_OTHER): Payer: Medicare Other | Admitting: Neurology

## 2022-02-19 DIAGNOSIS — G4700 Insomnia, unspecified: Secondary | ICD-10-CM

## 2022-02-19 DIAGNOSIS — G4733 Obstructive sleep apnea (adult) (pediatric): Secondary | ICD-10-CM | POA: Diagnosis not present

## 2022-02-19 DIAGNOSIS — Z9889 Other specified postprocedural states: Secondary | ICD-10-CM

## 2022-02-19 DIAGNOSIS — G478 Other sleep disorders: Secondary | ICD-10-CM

## 2022-02-19 DIAGNOSIS — Z9682 Presence of neurostimulator: Secondary | ICD-10-CM

## 2022-02-19 DIAGNOSIS — Z789 Other specified health status: Secondary | ICD-10-CM

## 2022-03-03 NOTE — Procedures (Signed)
[facility] Inspire Titration Report    General Information: Jessica Gibson is a 76 y.o. Caucasian female patient of Dr Philip Aspen and Dr Redmond Baseman and was seen on 02-05-2022. The patient was dx with OSA, in 2019 when she was titrated to CPAP.  She presented for evaluation of alternative sleep apnea therapies in 2022 I repeat home sleep test from November 2022 diagnosed her with moderate severe apnea at an AHI of 32.9/h , REM sleep AHI was severely elevated at 52/h supine sleep AHI was 65/h.  The patient had 6.4 minutes of oxygen desaturation below 89% saturation.  The expectation was discussed that only the non-REM sleep apnea will respond to inspire device but this would allow for reduction of an AHI by 50% or more.  She had undergone nasal surgery.  She is reporting still uncontrolled HTN after weight loss and  likely change in OSA breathing since nasal surgery but she was not able to tolerate CPAP.   The patient had opted for INSPIRE, which was implanted on 10-14-2021 by Dr Redmond Baseman, and was here for activation on 11-20-2021 She was instructed to slowly work her way up  in increments of 0.1 V per week, as tolerated. She brought her INSPIRE home sleep diary with her, is highly compliant, her data download showed the weekly increase in voltage - which she has tolerated very well. Level 10, reached on 01-22-2022. She noted at first improvement on level 4-5 . She feels she sleeps deeper, less fragmented and with more restorative quality.  She demonstrated the tongue movement here.  She will have a sleep study with INSPIRE titration on 11-19-2021 at [facility].  She reported she no longer takes hydrocodone , is on Lopressor, Minoxidil, Norvasc.   Epworth now 5 points, more energy and longer into the day, FSS at 13/ 63 ,no depression.       Name: Jessica, Gibson BMI: 28 Physician: Larey Seat, MD  ID: [id number] Height: 88 in Technician: Gaylyn Cheers  Sex: Female  Weight: 158 lb Record: [id number]  Age: 32 [02/07/1946] Date: 02/19/2022 Scorer: Gaylyn Cheers   Recommended Settings IPAP: N/A cmH20 EPAP: N/A cmH2O AHI: N/A AHI (4%): N/A   Pressure  00 02   O2 Vol 0.0 0.0  Time TRT [id number] TST 18.18m33m0.18m 62meep Stage % Wake 89.4 38.5   % REM 0.0 0.0   % N1 78.4 17.0   % N2 21.6 78.0   % N3 0.0 5.0  Respiratory 3%  Total Events 18 43   Obs. A 18 29   Mixed A. 0 0   Cen. A 0 7                           Respiratory (4%) Hypopneas (4%) 0.00 6.00   AHI (4%) 58.38 20.91   Supine AHI (4%) 80.00 0.00   Prone AHI (4%) 0.00 0.00   Side AHI (4%) 18.46 20.91  Desat Profile <= 90% 45.45m 582mm   44m80% 0.432m 0.0518m <=88m% 0.432m 0.32m 332m= 69m0.432m 0.32m  [id number] Apnea 42.2 9.0   Hypopnea 0.0 1.0   LM 0.0 1.5   Spontaneous 55.1 24.9    SUMMARY: The patient presented for an in- laboratory sleep study with goal of titration to Inspire, [place] activation of this device. The Study was delayed due to prolonged sleep latency of over 2  hours and started finally at at 1.5V output, only 18.5 minutes of sleep were recorded mostly NREM stage 1. Total AHI was 53/h-under 1.5 V, with a supine AHI of 80/h and non -supine AHI of 18/h, no REM sleep was recorded. This step was followed by 1.6 V setting, 120 minutes of sleep, AHI 21/h, all non- supine sleep, no REM sleep but N3 sleep. a total of 53 .1 minutes of sleep were recorded at or below 90% 02 saturation, with the 02 nadir at 86%, and the sleep heart rate varied between 53 and 66 bpm.  CONCLUSION: This patient struggled with Insomnia while in the sleep lab. she did not report these troubles for in home sleep. a setting of 1.6 V helped to reduce the AHI by 50% but further titration at home is recommended as well as avoiding supine sleep. Goal should be a Voltage between 1.6 and 2.6 V- if tolerable.  Larey Seat, MD , Medical Director of [facility] Sleep Board certified in Sleep  Medicine and Neurology by the [place], [place].  03-02-2022

## 2022-03-05 ENCOUNTER — Encounter: Payer: Self-pay | Admitting: Neurology

## 2022-03-05 ENCOUNTER — Ambulatory Visit (INDEPENDENT_AMBULATORY_CARE_PROVIDER_SITE_OTHER): Payer: Medicare Other | Admitting: Neurology

## 2022-03-05 VITALS — BP 150/74 | HR 64 | Ht 62.0 in | Wt 158.0 lb

## 2022-03-05 DIAGNOSIS — Z9682 Presence of neurostimulator: Secondary | ICD-10-CM | POA: Diagnosis not present

## 2022-03-05 DIAGNOSIS — Z789 Other specified health status: Secondary | ICD-10-CM

## 2022-03-10 NOTE — Progress Notes (Signed)
SLEEP MEDICINE CLINIC    Provider:  Larey Seat, MD  Primary Care Physician:  Jessica Gibson, [place] [address]     Referring Provider:  Dr Redmond Baseman, MD    Jessica Gibson, [address] [place],  [place] [address]          Chief Complaint according to patient   Patient presents with:     New Patient (Initial Visit)           HISTORY OF PRESENT ILLNESS:  Jessica Gibson is a 76 y.o. Caucasian female patient seen here on 03-05-2022: This patient presents post hypoglossal nerve implantation ( 10-14-2021, Dr Redmond Baseman) and on 11-20-2021 she underwent activation of the [facility] device.  She subjectively reports less fragmented sleep, but has had chronic problems with sleep initiation that are not improving under Inspire. On 02-19-2022 she underwent an lab titration on Inspire :  The Study Sleep onset was delayed due to prolonged sleep latency of over 2 hours and sleep was recorded finally at 1.5V output, but only 18.5 minutes of sleep were recorded, mostly NREM stage 1. Total AHI was 53/h-under 1.5 V, with a supine AHI of 80/h and non -supine AHI of 18/h, no REM sleep was recorded. This first step was followed by increase to 1.6 V setting, 120 minutes of sleep, AHI 21/h, all non- supine sleep, no REM sleep but N3 sleep.  A total of 53 .1 minutes of sleep were recorded at or below 90% 02 saturation, with the 02 nadir at 86%, and the sleep heart rate varied between 53 and 66 bpm ( bradycardia trend) .  CONCLUSION: This patient struggled with Insomnia while in the sleep lab.  A setting of 1.6 V helped to reduce the AHI by 50% but further titration at home is recommended as well as avoiding supine sleep. Goal should be a Voltage between 1.6 and 2.6 V- if tolerable.  Larey Seat, MD , Medical Director of [facility] Sleep Board certified in Sleep Medicine and Neurology by the [employer], [place].     02-05-2022. Reporting still uncontrolled HTN after weight loss and   likely change in OSA since nasal surgery after she was not able to tolerate CPAP. The patient had opted for INSPIRE,  implanted on 10-14-2021 by Dr Redmond Baseman,  and was here for activation. 11-20-2021  She was instructed to slowly work her way up  in increments of 0.1 V per week, as tolerated. She brought her INSPIRE  home sleep diary with her, is highly compliant, her data download showed the weekly increase in voltage - which she has tolerated very well. Level 10, reached on 01-22-2022.  Patient has tolerated surgery and tolerated in pressure Voltage up to level 10 now, reached on 01-22-2022.   She noted at first improvement on level 4-5 . She feels she sleeps deeper, less fragmented and with more restorative quality.  She demonstrated the tongue movement here.  She will have a sleep study with INSPIRE titration on 11-19-2021 at [facility].  She reported she no longer takes hydrocodone , is on Lopressor, minoxidil, norvasc.  Epworth now 5 points, more energy and longer into the day, FSS at 13/ 63 , no depression.   I have copied the current settings and compliance below. : CD   Jessica Gibson has a past medical history of Arthritis, Atrophic vaginitis, CIN I (cervical intraepithelial neoplasia I) (03/2004), Heart murmur, History of kidney stones, vertigo, Hypercholesteremia, Hypertension, Nasal  turbinate hypertrophy, OSA (obstructive sleep apnea), Other malaise and fatigue (02/28/2014), Post-operative nausea and vomiting, and Snoring (02/28/2014).                                     Note        Recent Patient Communication   Last Update Reason Specialty    Today -- Sleep Medicine    [facility]       11-20-2021:  Seen upon a requested sleep Consultation per Dr Redmond Baseman, MD, ENT.   now here to ACTIVATE:PCP Dr. Philip Aspen, MD.  Set today between 0.9 - 1.9 V .  Surgical recovery was quick, but she reports itching  on the surgical site. No swelling. She then started to note some pus 10 days ago- using alcohol rubbing pads.                04-23-2021 , HST for this [name] interested  patient  to evaluate apnea type and degree and expected therapy effect. CPAP intolerant patient who had nasal surgery.    Epworth sleepiness score: 6/24.   BMI: 30 kg/m   Neck Circumference: 15 inches         Percent REM (%): 24.6%                                       Respiratory Indices:   Calculated pAHI (per hour): The calculated AHI by 4 % desaturation was 18.9/h and by 3% desaturation 32.9/h . In REM sleep AHI was 51.8/h and in non-REM sleep 26.7/h.                                                Positional AHI: In supine sleep the AHI was 65.2/h sleeping on the right side 29.8/h and on the left 26.7/h. Snoring moderate volume with a mean volume of 41 dB and accompanied 34% of total sleep time.                                                Oxygen Saturation Statistics:                O2 Saturation Range (%): Between a nadir at 82% and a maximum of 98% the mean oxygenation was 92%.  There were only 6.4 minutes of hypoxia noted which is clinically irrelevant..                                     O2 Saturation (minutes) <89%:6.4 minutes.         Pulse Rate Statistics: Pulse Range:   Between 50 and 80 bpm with a mean heart rate of 58 bpm.              IMPRESSION: REM exacerbated OSA- This HST confirms the presence of ongoing severe sleep apnea /obstructive in nature and clustering in REM  sleep.  Since REM sleep was concentrated after 2:30 AM there were many more apneas and hypopneas noted in that the later half of the study.   The patient slept very little in supine sleep a total of 55 minutes and I would advise her to avoid supine sleep as much as possible. No hypoxemia was present.     RECOMMENDATION:I expect that only the non-REM sleep apnea could be  reduced by Inspire. This still would account for over 50 % reduction in apnea. Avoiding supine sleep may further reduce the overall AHI to mild degree apnea, AHI 15 or below.    INTERPRETING PHYSICIAN:    Larey Seat, MD    Medical Director of [facility] at [facility].               Marland Kitchen  Chief concern according to patient :   Paper referral for alternative treatment for OSA. Pt is interested in getting Inspire. Pt had SS done here in 2015 and 2019. Was last seen in our office on 11/09/17 for RV as well.  She recently has nasal surgery with ENT Dr Redmond Baseman, and she feels she already sleeps better.   The patient had the first sleep study on April 05, 2014 at the time referred by her primary care physician Dr. Bevelyn Buckles.  The patient had a low sleep efficiency 60.9% only she did have a mild form of obstructive sleep apnea 12.9 her RDI was 24.2 which means that she was a loud snorer.  This was not REM dependent apnea with a REM sleep AHI of 29/h non-REM sleep AHI of 11.1.  There was also a lot of supine apnea exacerbation.  Supine AHI was 28.5 versus nonsupine 9.1.  She did only have 1612 2 minutes of desaturation which is not clinically significant.  She then had a new sleep study on 3-22 2019 there were no periodic limb movements the patient slept very well for the last two thirds of the night but she preferred to sleep on her back.  She was titrated to CPAP beginning at 5 and ending at 6 cmH2O and her RDI or AHI reached 1.9.  She now underwent nasal surgery and feels that since she can breathe easier through her nose she may no longer have the same degree of sleep apnea or type of sleep apnea and also if sleep apnea is still present she would like to be evaluated if she is a valid candidate for the inspire device.  She endorsed the Epworth sleepiness score at 6 out of 24 points fatigue severity was low at 17.  Her geriatric depression score was endorsed at only 1 out of 15 points.  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     Sleep relevant medical history: Nocturia once, no RLS, averages 7 hours of sleep, no naps in daytime.  Nasal surgery recently restored her nasal patency. The preferred sleep position is sideways- horseshoe pillow- Dreams are reportedly frequent/vivid.  8  AM is the usual rise time. The patient wakes up spontaneously.  She reports not feeling refreshed or restored in AM, with symptoms such as dry mouth, morning headaches, and residual fatigue. This improved after nasal surgery-    Review of Systems: Out of a complete 14 system review, the patient complains of only the following symptoms, and all other reviewed systems are negative.:   Epworth in  previous visit : 5/ 24 points, FSS at 13/ 63 points.     How likely are you to doze in the following situations: 0 = not likely, 1 = slight chance, 2 = moderate chance, 3 = high chance   Sitting and Reading? Watching Television? Sitting inactive in a public place (theater or meeting)? As a passenger in a car for an hour without a break? Lying down in the afternoon when circumstances permit? Sitting and talking to someone? Sitting quietly after lunch without alcohol? In a car, while stopped for a few minutes in traffic?   Total = 4 from 6/ 24 points 03-05-2022  FSS endorsed at  16 from 17/ 63 points.   Social History   Socioeconomic History   Marital status: Widowed    Spouse name: Not on file   Number of children: 2   Years of education: College   Highest education level: Not on file  Occupational History    Employer: SELF EMPLOYED  Tobacco Use   Smoking status: Never   Smokeless tobacco: Never  Vaping Use   Vaping Use: Never used  Substance and Sexual Activity   Alcohol use: Yes    Alcohol/week: 0.0 standard drinks of alcohol    Comment: 2 drinks/month   Drug use: No    Sexual activity: Not Currently    Birth control/protection: Post-menopausal    Comment: 1st intercourse 76 yo-Fewer than 5 partners  Other Topics Concern   Not on file  Social History Narrative   Patient is widowed and lives alone.   Patient has two adult children.   Patient is self-employed, semi-retired.   Patient is right-handed.   Patient drinks three cups of coffee daily.   Patient has some college education.   Social Determinants of Health   Financial Resource Strain: Not on file  Food Insecurity: Not on file  Transportation Needs: Not on file  Physical Activity: Not on file  Stress: Not on file  Social Connections: Not on file    Family History  Problem Relation Age of Onset   Diabetes Mother    Hypertension Mother    Heart disease Mother    Heart disease Father    Other Father        circulation problems   Colon cancer Neg Hx     Past Medical History:  Diagnosis Date   Arthritis    "wrist, shoulders; its just there "   Atrophic vaginitis    CIN I (cervical intraepithelial neoplasia I) 03/2004   LEEP   Heart murmur    dx in childohood; " i have a small hole in my hart but it doesnt do anything"    History of kidney stones    "i had 1 kidney at age 38 and at that time they went up, put a basket and retrieved "    Hx of vertigo    "3 to 4 months of it; but currently no symptoms becasue i used the epley maneuver"    Hypercholesteremia    cant tolerate statins; just watching diet ; currently on weight watchers   Hypertension    Nasal turbinate hypertrophy    dx per Dr. Redmond Baseman ; uses nasal spray for relief    OSA (obstructive sleep apnea)    initial dx in 2015 as mild; retest 07-09-17 dx with SEVERE OSA ; no current CPAP use, per Neuro Dr Maureen Chatters plan to complete CPAP titration (see 07-19-17 telephone note in epic)   Other malaise and  fatigue 02/28/2014   Post-operative nausea and vomiting    Snoring 02/28/2014    Past Surgical History:  Procedure Laterality  Date   ARTHROSCOPY OF KNEE Right    same practice as Dr Brunetta [facility] now [facility]    CERVICAL BIOPSY  W/ LOOP ELECTRODE EXCISION  2005   done with Dr Deeann Cree ; [place]. same practice as Dr Phineas Real; reports : " i guess it was benign because we didnt have to do anything"    [place] N/A 08/25/2017   Procedure: [place];  Surgeon: Anastasio Auerbach, MD;  Location: [place];  Service: Gynecology;  Laterality: N/A;  request to follow around 10:15am in [facility] block time. requests one hour OR time.   DRUG INDUCED ENDOSCOPY N/A 12/06/2020   Procedure: DRUG INDUCED ENDOSCOPY;  Surgeon: Melida Quitter, MD;  Location: [facility];  Service: ENT;  Laterality: N/A;   IMPLANTATION OF HYPOGLOSSAL NERVE STIMULATOR Right 09/30/2021   Procedure: IMPLANTATION OF HYPOGLOSSAL NERVE STIMULATOR;  Surgeon: Melida Quitter, MD;  Location: [place];  Service: ENT;  Laterality: Right;   NASAL TURBINATE REDUCTION Bilateral 12/06/2020   Procedure: TURBINATE REDUCTION/SUBMUCOSAL RESECTION;  Surgeon: Melida Quitter, MD;  Location: [place];  Service: ENT;  Laterality: Bilateral;   REPLACEMENT TOTAL KNEE Right 2009   dr Wynelle Link      Current Outpatient Medications on File Prior to Visit  Medication Sig Dispense Refill   acetaminophen (TYLENOL) 650 MG CR tablet Take 650 mg by mouth every 8 (eight) hours as needed for pain.     amLODipine (NORVASC) 10 MG tablet Take 10 mg by mouth daily.     ibuprofen (ADVIL,MOTRIN) 200 MG tablet Take 400 mg by mouth every 6 (six) hours as needed for moderate pain (pain).     losartan-hydrochlorothiazide (HYZAAR) 100-12.5 MG tablet Take 1 tablet by mouth daily.     metoprolol tartrate (LOPRESSOR) 50 MG tablet Take 50 mg by mouth 2 (two) times daily.     minoxidil (LONITEN) 10 MG tablet Take 10 mg by mouth 2 (two) times daily.      HYDROcodone-acetaminophen (NORCO) 7.5-325 MG tablet Take 1 tablet by mouth every 6 (six) hours as needed for moderate pain. 12 tablet 0   No current facility-administered medications on file prior to visit.    Allergies  Allergen Reactions   Ezetimibe Other (See Comments)    Joint pain   Labetalol Hcl Other (See Comments)    Took way taste   Other Other (See Comments)   Pravastatin Other (See Comments)    Joint pain    Physical exam:  Wt Readings from Last 3 Encounters:  03/05/22 158 lb (71.7 kg)  02/05/22 158 lb 8 oz (71.9 kg)  09/30/21 160 lb 15 oz (73 kg)     Ht Readings from Last 3 Encounters:  03/05/22 '5\' 2"'$  (1.575 m)  02/05/22 '5\' 2"'$  (1.575 m)  09/30/21 '5\' 2"'$  (1.575 m)      General: The patient is awake, alert and appears not in acute distress. The patient is well groomed. Head: Normocephalic, atraumatic. Neck is supple.  Mallampati 2,  neck circumference:15 inches   Nasal airflow improved,  patent.  Retrognathia is not seen.  Cardiovascular:  Regular rate and cardiac rhythm by pulse,  without distended neck veins. Respiratory: Lungs are clear to auscultation.  Skin:  Without evidence of ankle edema, or rash. Trunk: The  patient's posture is erect.   Neurologic exam : The patient is awake and alert, oriented to place and time.   Memory subjective described as intact.  Attention span & concentration ability appears normal.  Speech is fluent,  without  dysarthria, dysphonia or aphasia.  Mood and affect are appropriate.   Cranial nerves: no loss of smell or taste reported  Pupils are equal and briskly reactive to light. Funduscopic exam deferred, beginning cataracts. .  Extraocular movements in vertical and horizontal planes were intact and without nystagmus. No Diplopia. Visual fields by finger perimetry are intact.  6 days ago                Note        ASSESSMENT : Status post hypoglossal nerve stimulator implantation following sleep study from  05-14-2021, Implantation 10-01-2021.    1) Her morning headaches and dry mouth and fatigue have improved post surgery. Her sleep is more restorative and refreshing. 2) She still has some insomnia - sleep initiation insomnia.    I would like to thank DR. Redmond Baseman, MD and Jessica Gibson, [address] [place],  [place] [address] for allowing me to meet with and to take care of this pleasant patient. She will receive a follow up visit after she advanced the inspire Voltage weekly at home.    PLAN :  I plan to follow up  after titration study in 02-19-2022 either personally or through our NP within 2-4 months.  Electronically signed by: Larey Seat, MD 03/10/2022 6:02 PM  [facility] Board certified by The [employer] of Sleep Medicine and Diplomate of the [employer] of Sleep Medicine. Board certified In Neurology through the [place], Fellow of the [employer] of Neurology. Medical Director of [facility].

## 2022-03-24 DIAGNOSIS — I10 Essential (primary) hypertension: Secondary | ICD-10-CM | POA: Diagnosis not present

## 2022-03-24 DIAGNOSIS — Z23 Encounter for immunization: Secondary | ICD-10-CM | POA: Diagnosis not present

## 2022-03-24 DIAGNOSIS — E559 Vitamin D deficiency, unspecified: Secondary | ICD-10-CM | POA: Diagnosis not present

## 2022-03-24 DIAGNOSIS — G4733 Obstructive sleep apnea (adult) (pediatric): Secondary | ICD-10-CM | POA: Diagnosis not present

## 2022-03-24 DIAGNOSIS — E669 Obesity, unspecified: Secondary | ICD-10-CM | POA: Diagnosis not present

## 2022-04-08 DIAGNOSIS — Z23 Encounter for immunization: Secondary | ICD-10-CM | POA: Diagnosis not present

## 2022-04-16 DIAGNOSIS — Z1231 Encounter for screening mammogram for malignant neoplasm of breast: Secondary | ICD-10-CM | POA: Diagnosis not present

## 2022-04-17 ENCOUNTER — Encounter: Payer: Self-pay | Admitting: Obstetrics and Gynecology

## 2022-09-03 ENCOUNTER — Ambulatory Visit: Payer: Medicare HMO | Admitting: Neurology

## 2022-09-03 ENCOUNTER — Telehealth: Payer: Self-pay | Admitting: Neurology

## 2022-09-03 ENCOUNTER — Encounter: Payer: Self-pay | Admitting: Neurology

## 2022-09-03 DIAGNOSIS — G4733 Obstructive sleep apnea (adult) (pediatric): Secondary | ICD-10-CM

## 2022-09-03 DIAGNOSIS — Z9889 Other specified postprocedural states: Secondary | ICD-10-CM

## 2022-09-03 DIAGNOSIS — Z9682 Presence of neurostimulator: Secondary | ICD-10-CM

## 2022-09-03 NOTE — Telephone Encounter (Signed)
Patient saw Dr. Brett Fairy on 09/03/2022.  Results of her inspire:

## 2022-09-03 NOTE — Progress Notes (Addendum)
Provider:  Larey Seat, MD  Primary Care Physician:  Donnajean Lopes, [place] [address]     Referring Provider: Donnajean Lopes, [place],  [place] [id number]          Chief Complaint according to patient   Patient presents with:     New Patient (Initial Visit)           HISTORY OF PRESENT ILLNESS:  Jessica Gibson is a 77 y.o. female patient who is here for revisit 09/03/2022 for  INSPIRE follow up, now on 1.7 Volts. .  Chief concern according to patient :  none  Patient saw Dr. Brett Fairy on 09/03/2022.   Results of her inspire:                 09-03-2022: RV in 6 months. For a HST on 1.7 V. Level 3 on her remote. Good tongue motion, no side effects.  She is shows excellent compliance and we will later paste her compliance record into 2 days note average use of time per night is 7 hours 25 minutes, she endorsed the Epworth Sleepiness Scale at 5 out of 24 points fatigue severity at 15 out of 65 points.  Both are low her preimplant AHI was 19/h and during her therapeutic titration September 2023 she reached 6.4/h we did not make changes to her control today she has reached a 1.7 V output pressure this will be for now where she remains.  I will order a home sleep test for 6 months from now to see if her therapeutic level allows and AHI reduction under 5.   Jessica Gibson is a 77 y.o. Caucasian female patient seen here on 03-05-2022: This patient presents post hypoglossal nerve implantation ( 10-14-2021, Dr Redmond Baseman) and on 11-20-2021 she underwent activation of the [facility] device.  She subjectively reports less fragmented sleep, but has had chronic problems with sleep initiation that are not improving under Inspire. On 02-19-2022 she underwent an lab titration on Inspire :   The Study Sleep onset was delayed due to prolonged sleep latency of over 2 hours and sleep was recorded finally at 1.5V output, but only 18.5 minutes of sleep  were recorded, mostly NREM stage 1. Total AHI was 53/h-under 1.5 V, with a supine AHI of 80/h and non -supine AHI of 18/h, no REM sleep was recorded. This first step was followed by increase to 1.6 V setting, 120 minutes of sleep, AHI 21/h, all non- supine sleep, no REM sleep but N3 sleep.  A total of 53 .1 minutes of sleep were recorded at or below 90% 02 saturation, with the 02 nadir at 86%, and the sleep heart rate varied between 53 and 66 bpm ( bradycardia trend) .  CONCLUSION: This patient struggled with Insomnia while in the sleep lab.  A setting of 1.6 V helped to reduce the AHI by 50% but further titration at home is recommended as well as avoiding supine sleep. Goal should be a Voltage between 1.6 and 2.6 V- if tolerable.  Larey Seat, MD , Medical Director of [facility] Sleep Board certified in Sleep Medicine and Neurology by the [place], [place].        02-05-2022. Reporting still uncontrolled HTN after weight loss and  likely change in OSA since nasal surgery after she was not able to tolerate CPAP. The patient had opted for INSPIRE,  implanted on 10-14-2021 by Dr Redmond Baseman,  and was here for activation. 11-20-2021  She was instructed to slowly work her way up  in increments of 0.1 V per week, as tolerated. She brought her INSPIRE  home sleep diary with her, is highly compliant, her data download showed the weekly increase in voltage - which she has tolerated very well. Level 10, reached on 01-22-2022.  Patient has tolerated surgery and tolerated in pressure Voltage up to level 10 now, reached on 01-22-2022.   She noted at first improvement on level 4-5 . She feels she sleeps deeper, less fragmented and with more restorative quality.  She demonstrated the tongue movement here.  She will have a sleep study with INSPIRE titration on 11-19-2021 at [place].     Social History   Socioeconomic History   Marital status: Widowed    Spouse name: Not on file   Number of children: 2   Years of  education: College   Highest education level: Not on file  Occupational History    Employer: SELF EMPLOYED  Tobacco Use   Smoking status: Never   Smokeless tobacco: Never  Vaping Use   Vaping Use: Never used  Substance and Sexual Activity   Alcohol use: Yes    Alcohol/week: 0.0 standard drinks of alcohol    Comment: 2 drinks/month   Drug use: No   Sexual activity: Not Currently    Birth control/protection: Post-menopausal    Comment: 1st intercourse 77 yo-Fewer than 5 partners  Other Topics Concern   Not on file  Social History Narrative   Patient is widowed and lives alone.   Patient has two adult children.   Patient is self-employed, semi-retired.   Patient is right-handed.   Patient drinks three cups of coffee daily.   Patient has some college education.   Social Determinants of Health   Financial Resource Strain: Not on file  Food Insecurity: Not on file  Transportation Needs: Not on file  Physical Activity: Not on file  Stress: Not on file  Social Connections: Not on file    Family History  Problem Relation Age of Onset   Diabetes Mother    Hypertension Mother    Heart disease Mother    Heart disease Father    Other Father        circulation problems   Colon cancer Neg Hx     Past Medical History:  Diagnosis Date   Arthritis    "wrist, shoulders; its just there "   Atrophic vaginitis    CIN I (cervical intraepithelial neoplasia I) 03/2004   LEEP   Heart murmur    dx in childohood; " i have a small hole in my hart but it doesnt do anything"    History of kidney stones    "i had 1 kidney at age 60 and at that time they went up, put a basket and retrieved "    Hx of vertigo    "3 to 4 months of it; but currently no symptoms becasue i used the epley maneuver"    Hypercholesteremia    cant tolerate statins; just watching diet ; currently on weight watchers   Hypertension    Nasal turbinate hypertrophy    dx per Dr. Redmond Baseman ; uses nasal spray for relief     OSA (obstructive sleep apnea)    initial dx in 2015 as mild; retest 07-09-17 dx with SEVERE OSA ; no current CPAP use, per Neuro Dr Maureen Chatters plan to complete CPAP titration (see 07-19-17 telephone note  in epic)   Other malaise and fatigue 02/28/2014   Post-operative nausea and vomiting    Snoring 02/28/2014    Past Surgical History:  Procedure Laterality Date   ARTHROSCOPY OF KNEE Right    same practice as Dr Brun[facility]    CERVICAL BIOPSY  W/ LOOP ELECTRODE EXCISION  2005   done with Dr Deeann Cree ; [place]. same practice as Dr Phineas Real; reports : " i guess it was benign because we didnt have to do anything"    [place] N/A 08/25/2017   Procedure: [place];  Surgeon: Anastasio Auerbach, MD;  Location: [place];  Service: Gynecology;  Laterality: N/A;  request to follow around 10:15am in [facility] block time. requests one hour OR time.   DRUG INDUCED ENDOSCOPY N/A 12/06/2020   Procedure: DRUG INDUCED ENDOSCOPY;  Surgeon: Melida Quitter, MD;  Location: [place];  Service: ENT;  Laterality: N/A;   IMPLANTATION OF HYPOGLOSSAL NERVE STIMULATOR Right 09/30/2021   Procedure: IMPLANTATION OF HYPOGLOSSAL NERVE STIMULATOR;  Surgeon: Melida Quitter, MD;  Location: [place];  Service: ENT;  Laterality: Right;   NASAL TURBINATE REDUCTION Bilateral 12/06/2020   Procedure: TURBINATE REDUCTION/SUBMUCOSAL RESECTION;  Surgeon: Melida Quitter, MD;  Location: [place];  Service: ENT;  Laterality: Bilateral;   REPLACEMENT TOTAL KNEE Right 2009   dr Wynelle Link      Current Outpatient Medications on File Prior to Visit  Medication Sig Dispense Refill   acetaminophen (TYLENOL) 650 MG CR tablet Take 650 mg by mouth every 8 (eight) hours as needed for pain.     amLODipine (NORVASC) 10 MG tablet Take 10 mg by mouth daily.     HYDROcodone-acetaminophen (NORCO) 7.5-325  MG tablet Take 1 tablet by mouth every 6 (six) hours as needed for moderate pain. 12 tablet 0   ibuprofen (ADVIL,MOTRIN) 200 MG tablet Take 400 mg by mouth every 6 (six) hours as needed for moderate pain (pain).     losartan-hydrochlorothiazide (HYZAAR) 100-12.5 MG tablet Take 1 tablet by mouth daily.     metoprolol tartrate (LOPRESSOR) 50 MG tablet Take 50 mg by mouth 2 (two) times daily.     minoxidil (LONITEN) 10 MG tablet Take 10 mg by mouth 2 (two) times daily.     No current facility-administered medications on file prior to visit.    Allergies  Allergen Reactions   Ezetimibe Other (See Comments)    Joint pain   Labetalol Hcl Other (See Comments)    Took way taste   Other Other (See Comments)   Pravastatin Other (See Comments)    Joint pain     DIAGNOSTIC DATA (LABS, IMAGING, TESTING) - I reviewed patient records, labs, notes, testing and imaging myself where available.  Lab Results  Component Value Date   WBC 6.2 12/06/2020   HGB 13.8 12/06/2020   HCT 42.2 12/06/2020   MCV 89.4 12/06/2020   PLT 336 12/06/2020      Component Value Date/Time   NA 139 09/24/2021 1231   K 4.4 09/24/2021 1231   CL 104 09/24/2021 1231   CO2 27 09/24/2021 1231   GLUCOSE 99 09/24/2021 1231   BUN 19 09/24/2021 1231   CREATININE 0.90 09/24/2021 1231   CALCIUM 9.8 09/24/2021 1231   PROT 7.5 08/12/2017 1100   ALBUMIN 4.1 08/12/2017 1100   AST 20 08/12/2017 1100   ALT 16 08/12/2017 1100  ALKPHOS 89 08/12/2017 1100   BILITOT 0.7 08/12/2017 1100   GFRNONAA >60 09/24/2021 1231   GFRAA >60 08/12/2017 1100   No results found for: "CHOL", "HDL", "LDLCALC", "LDLDIRECT", "TRIG", "CHOLHDL" No results found for: "HGBA1C" No results found for: "VITAMINB12" No results found for: "TSH"  PHYSICAL EXAM:  There were no vitals filed for this visit. There is no height or weight on file to calculate BMI.   Wt Readings from Last 3 Encounters:  03/05/22 158 lb (71.7 kg)  02/05/22 158 lb 8 oz (71.9  kg)  09/30/21 160 lb 15 oz (73 kg)     Ht Readings from Last 3 Encounters:  03/05/22 5\' 2"  (1.575 m)  02/05/22 5\' 2"  (1.575 m)  09/30/21 5\' 2"  (1.575 m)      General: The patient is awake, alert and appears not in acute distress. The patient is well groomed. Head: Normocephalic, atraumatic. Neck is supple. Cardiovascular:  Regular rate and cardiac rhythm by pulse,  without distended neck veins. Respiratory: Lungs are clear to auscultation.  Skin:  Without evidence of ankle edema, or rash. Trunk: The patient's posture is erect.   NEUROLOGIC EXAM: The patient is awake and alert, oriented to place and time.   Memory subjective described as intact.  Attention span & concentration ability appears normal.  Speech is fluent,  with dysphonia   Mood and affect are appropriate.   Cranial nerves: no loss of smell or taste reported  Pupils are equal and briskly reactive to light. Hearing was intact to soft voice and finger rubbing.    Facial sensation intact to fine touch.  Facial motor strength is symmetric and tongue and uvula move midline.  Neck ROM : rotation, tilt and flexion extension were normal for age and shoulder shrug was symmetrical.       ASSESSMENT AND PLAN 77 y.o. year old female  here with OSA treatment by hypoglossal stimulator ,   Inspire follow up, working up to 1.7 V :   I will order a home sleep test for 6 months from now to see if her therapeutic level allows an AHI reduction under 5/h.   I plan to follow up either personally or through our NP within 6- 7 months.   I would like to thank Donnajean Lopes, MD and Donnajean Lopes, [place],  [place] [address] for allowing me to meet with and to take care of this pleasant patient.   After spending a total time of  25  minutes face to face and additional time for physical and neurologic examination, review of laboratory studies,  personal review of imaging studies, reports and results of other  testing and review of referral information / records as far as provided in visit,   Electronically signed by: Larey Seat, MD 09/03/2022 11:05 AM  [facility] Board certified by The [employer] of Sleep Medicine and Diplomate of the [employer] of Sleep Medicine. Board certified In Neurology through the [place], Fellow of the [employer] of Neurology. Medical Director of [employer].

## 2022-09-07 NOTE — Progress Notes (Signed)
77 y.o. G2P2 Widowed Caucasian female here for breast and pelvic exam and pap  Denies vaginal bleeding.   Not using vaginal estrogen cream currently.  Feels she does not need this. Using cooking oils.   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   PCP:   Dr. Eloise Harman  No LMP recorded. Patient is postmenopausal.           Sexually active: No.   Not sexually active since prior to her husband passing.  The current method of family planning is post menopausal status.    Exercising: Yes.     Silver sneakers Smoker:  no  Health Maintenance: Pap:  09/10/20 neg, 11/04/16 WNL History of abnormal Pap:  yes, 2005 Hx LEEP--CIN I  MMG:  04/16/22 Breast Density Category B, BI-RADS CAT 2 benign Colonoscopy:  03/15/13 - [facility].  She has an appointment.  BMD:   years ago  Result  normal with PCP TDaP:  PCP Gardasil:   no HIV: n/a Hep C: n/a Screening Labs:  PCP   reports that she has never smoked. She has never used smokeless tobacco. She reports current alcohol use. She reports that she does not use drugs.  Past Medical History:  Diagnosis Date   Arthritis    "wrist, shoulders; its just there "   Atrophic vaginitis    CIN I (cervical intraepithelial neoplasia I) 03/2004   LEEP   Heart murmur    dx in childohood; " i have a small hole in my hart but it doesnt do anything"    History of kidney stones    "i had 1 kidney at age 39 and at that time they went up, put a basket and retrieved "    Hx of vertigo    "3 to 4 months of it; but currently no symptoms becasue i used the epley maneuver"    Hypercholesteremia    cant tolerate statins; just watching diet ; currently on weight watchers   Hypertension    Nasal turbinate hypertrophy    dx per Dr. Jenne Pane ; uses nasal spray for relief    OSA (obstructive sleep apnea)    initial dx in 2015 as mild; retest 07-09-17 dx with SEVERE OSA ; no current CPAP use, per Neuro Dr Richardean Chimera plan to complete CPAP titration (see 07-19-17  telephone note in epic)   Other malaise and fatigue 02/28/2014   Post-operative nausea and vomiting    Snoring 02/28/2014    Past Surgical History:  Procedure Laterality Date   ARTHROSCOPY OF KNEE Right    same practice as Dr Bar[facility]    CERVICAL BIOPSY  W/ LOOP ELECTRODE EXCISION  2005   done with Dr Domingo Dimes ; [facility]. same practice as Dr Audie Box; reports : " i guess it was benign because we didnt have to do anything"    CYSTOSCOPY     DILATATION & CURETTAGE/HYSTEROSCOPY WITH MYOSURE N/A 08/25/2017   Procedure: DILATATION & CURETTAGE/HYSTEROSCOPY WITH MYOSURE;  Surgeon: Dara Lords, MD;  Location: [facility];  Service: Gynecology;  Laterality: N/A;  request to follow around 10:15am in [facility] block time. requests one hour OR time.   DRUG INDUCED ENDOSCOPY N/A 12/06/2020   Procedure: DRUG INDUCED ENDOSCOPY;  Surgeon: Christia Reading, MD;  Location: [facility] OR;  Service: ENT;  Laterality: N/A;   IMPLANTATION OF HYPOGLOSSAL NERVE STIMULATOR Right 09/30/2021   Procedure: IMPLANTATION OF HYPOGLOSSAL NERVE STIMULATOR;  Surgeon: Christia Reading,  MD;  Location: [facility];  Service: ENT;  Laterality: Right;   NASAL TURBINATE REDUCTION Bilateral 12/06/2020   Procedure: TURBINATE REDUCTION/SUBMUCOSAL RESECTION;  Surgeon: Christia Reading, MD;  Location: [facility] OR;  Service: ENT;  Laterality: Bilateral;   REPLACEMENT TOTAL KNEE Right 2009   dr Lequita Halt    sleep apnea device  12/2021    Current Outpatient Medications  Medication Sig Dispense Refill   acetaminophen (TYLENOL) 650 MG CR tablet Take 650 mg by mouth every 8 (eight) hours as needed for pain.     amLODipine (NORVASC) 10 MG tablet Take 10 mg by mouth daily.     Cholecalciferol (VITAMIN D) 50 MCG (2000 UT) CAPS Take 1 capsule Orally Once a day for 90 days     ibuprofen (ADVIL,MOTRIN) 200 MG tablet Take 400 mg by mouth every 6 (six) hours as needed for moderate pain (pain).      losartan-hydrochlorothiazide (HYZAAR) 100-12.5 MG tablet Take 1 tablet by mouth daily.     metoprolol tartrate (LOPRESSOR) 50 MG tablet Take 50 mg by mouth 2 (two) times daily.     minoxidil (LONITEN) 10 MG tablet Take 10 mg by mouth 2 (two) times daily.     zaleplon (SONATA) 5 MG capsule 1 capsule at bedtime as needed Orally Once a day for 30 days     No current facility-administered medications for this visit.    Family History  Problem Relation Age of Onset   Diabetes Mother    Hypertension Mother    Heart disease Mother    Heart disease Father    Other Father        circulation problems   Cancer - Colon Child    Colon cancer Neg Hx    Liver disease Neg Hx    Esophageal cancer Neg Hx     Review of Systems  All other systems reviewed and are negative.   Exam:   BP 126/84 (BP Location: Right Arm, Patient Position: Sitting, Cuff Size: Normal)   Pulse 61   Ht 5\' 2"  (1.575 m)   Wt 157 lb (71.2 kg)   SpO2 96%   BMI 28.72 kg/m     General appearance: alert, cooperative and appears stated age Head: normocephalic, without obvious abnormality, atraumatic Neck: no adenopathy, supple, symmetrical, trachea midline and thyroid normal to inspection and palpation Lungs: clear to auscultation bilaterally Breasts: normal appearance, no masses or tenderness, No nipple retraction or dimpling, No nipple discharge or bleeding, No axillary adenopathy Heart: regular rate and rhythm.  Systolic murmur noted. Abdomen: soft, non-tender; no masses, no organomegaly Extremities: extremities normal, atraumatic, no cyanosis or edema Skin: skin color, texture, turgor normal. No rashes or lesions Lymph nodes: cervical, supraclavicular, and axillary nodes normal. Neurologic: grossly normal  Pelvic: External genitalia:  no lesions              No abnormal inguinal nodes palpated.              Urethra:  normal appearing urethra with no masses, tenderness or lesions              Bartholins and  Skenes: normal                 Vagina: normal appearing vagina with normal color and discharge, no lesions              Cervix: no lesions.  Bleeds slightly with pap.  Pap taken: yes Bimanual Exam:  Uterus:  normal size, contour, position, consistency, mobility, non-tender              Adnexa: no mass, fullness, tenderness              Rectal exam: yes.  Confirms.              Anus:  normal sphincter tone, no lesions  Chaperone was present for exam:  Warren Lacy, CMA  Assessment:   Well woman visit with gynecologic exam. Vaginal atrophy. Remote history of LEEP.  CIN I.  Hx hysteroscopic polypectomy.   Plan: Mammogram screening discussed. Self breast awareness reviewed. Pap and reflex HR HPV testing  We discussed vaginal vitamin E and vaginal estrogens to treat atrophy.  She declines vaginal estrogens. Guidelines for Calcium, Vitamin D, regular exercise program including cardiovascular and weight bearing exercise. Next breast and pelvic exam and pap in 2 years.   Also follow up prn.  After visit summary provided.   10  total time was spent for this patient encounter, including preparation, face-to-face counseling with the patient, coordination of care, and documentation of the encounter in addition to doing the breast, pelvic, and pap.

## 2022-09-17 ENCOUNTER — Ambulatory Visit: Payer: Medicare HMO | Admitting: Gastroenterology

## 2022-09-17 ENCOUNTER — Encounter: Payer: Self-pay | Admitting: Gastroenterology

## 2022-09-17 VITALS — BP 130/60 | HR 68 | Ht 62.0 in | Wt 160.0 lb

## 2022-09-17 DIAGNOSIS — Z800 Family history of malignant neoplasm of digestive organs: Secondary | ICD-10-CM

## 2022-09-17 DIAGNOSIS — R194 Change in bowel habit: Secondary | ICD-10-CM

## 2022-09-17 MED ORDER — NA SULFATE-K SULFATE-MG SULF 17.5-3.13-1.6 GM/177ML PO SOLN: 1.0000 | Freq: Once | ORAL | 0 refills | Status: AC

## 2022-09-17 NOTE — Progress Notes (Addendum)
Referring Provider: Garlan Fillers, MD Primary Care Physician:  Garlan Fillers, MD   Reason for Consultation: Change in bowel habits, gas   IMPRESSION:  Change in bowel habits over the last year. Temporally associated with placement of hypoglassal nerse stimulator. No alarm features. I confirmed with Cathlyn Parsons, CRNA that she is a candidate for monitored anesthesia care.   Family history of colon cancer. Son just diagnosed at age 77.   Colon cancer screening. No adenomas or SSPs on high quality colonoscopy 2014.   Pancolonic diverticulosis on prior colonoscopy. Previously using Benefiber daily.    PLAN: - Obtain TSH from Dr. Eloise Harman (if not done in the last year will repeat at time of colonoscopy) - Resume Benefiber twice daily to try to regulate stool pattern - Use Miralax and hold for severe symptoms - Colonoscopy with a 2 day bowel prep to further investigate her change in bowel habits   HPI: Jessica Gibson is a 77 y.o. female presents for change in bowel. The history is obtained through the patient and review of her electronic health record. She is semi-retired as a book-keeper. Unaccompanied to this appointment today. She has OSA intolerant to CPAP who had a hypoglossal nerve stimulator placed 09/30/21.   Baseline bowel habits are one formed bowel movement most days. Was using Benefiber every day.  Since her surgery with implantation of hypoglossal nerve stimulator last year, her bowel habits have changed. Stool caliber is smaller and the consistency is closer to toothpaste. This alternates with constipation with straining of small pellets. Switch Benefiber to Miralax PRN. There is associated gas and embarrassing flatus.  With the constipation she has a hemorrhoid that swells. No blood or mucous in the stool. No systemic complaints.   Son was just diagnosed with colon cancer at age 67. This has her even more concerned. No other known family history of colon cancer or  colon polyps, or other GI malignancies.  Normal calcium April 2023 Normal CBC July 2022 No recent TSH  No prior abdominal imaging.   She had a screening colonoscopy with Dr. Jarold Motto 03/15/2013 that showed moderate pancolonic diverticulosis and a small hepatic flexure "polyp" that was actually benign lymphoid aggregate on pathology. The procedure went well without complication.     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 Jenne Pane ; uses nasal spray for relief    OSA (obstructive sleep apnea)    initial dx in 2015 as mild; retest 07-09-17 dx with SEVERE OSA ; no current CPAP use, per Neuro Dr Richardean Chimera plan to complete CPAP titration (see 07-19-17 telephone note in epic)   Other malaise and fatigue 02/28/2014   Post-operative nausea and vomiting  Snoring 02/28/2014    Past Surgical History:  Procedure Laterality Date   ARTHROSCOPY OF KNEE Right    same practice as Dr Barron SchmidAlusio, [facility]    CERVICAL BIOPSY  W/ LOOP ELECTRODE EXCISION  2005   done with Dr Domingo DimesGodsagan ; [facility]. same practice as Dr Audie BoxFontaine; reports : " i guess it was benign because we didnt have to do anything"    CYSTOSCOPY     DILATATION & CURETTAGE/HYSTEROSCOPY WITH MYOSURE N/A 08/25/2017   Procedure: DILATATION & CURETTAGE/HYSTEROSCOPY WITH MYOSURE;  Surgeon: Dara LordsFontaine, Timothy P, MD;  Location:  [facility];  Service: Gynecology;  Laterality: N/A;  [facility] to follow [facility]am [facility]. requests one hour OR time.   DRUG INDUCED ENDOSCOPY N/A 12/06/2020   Procedure: DRUG INDUCED ENDOSCOPY;  Surgeon: Christia ReadingBates, Dwight, MD;  [facility];  Service: ENT;  Laterality: N/A;   IMPLANTATION OF HYPOGLOSSAL NERVE STIMULATOR Right 09/30/2021   Procedure: IMPLANTATION OF HYPOGLOSSAL NERVE STIMULATOR;  Surgeon: Christia ReadingBates, Dwight, MD;  Location: [facility];  Service: ENT;  Laterality: Right;   NASAL TURBINATE REDUCTION Bilateral 12/06/2020   Procedure: TURBINATE REDUCTION/SUBMUCOSAL RESECTION;  Surgeon: Christia ReadingBates, Dwight, MD;  [facility];  Service: ENT;  Laterality: Bilateral;   REPLACEMENT TOTAL KNEE Right 2009   dr Lequita Haltaluisio    sleep apnea device  12/2021    Current Outpatient Medications  Medication Sig Dispense Refill   acetaminophen (TYLENOL) 650 MG CR tablet Take 650 mg by mouth every 8 (eight) hours as needed for pain.     amLODipine (NORVASC) 10 MG tablet Take 10 mg by mouth daily.     ibuprofen (ADVIL,MOTRIN) 200 MG tablet Take 400 mg by mouth every 6 (six) hours as needed for moderate pain (pain).     losartan-hydrochlorothiazide (HYZAAR) 100-12.5 MG tablet Take 1 tablet by mouth daily.     metoprolol tartrate (LOPRESSOR) 50 MG tablet Take 50 mg by mouth 2 (two) times daily.     minoxidil (LONITEN) 10 MG tablet Take 10 mg by mouth 2 (two) times daily.     Na Sulfate-K Sulfate-Mg Sulf 17.5-3.13-1.6 GM/177ML SOLN Take 1 kit by mouth once for 1 dose. 354 mL 0   No current facility-administered medications for this visit.    Allergies as of 09/17/2022 - Review Complete 09/17/2022  Allergen Reaction Noted   Ezetimibe Other (See Comments) 08/27/2020   Labetalol hcl Other (See Comments) 08/27/2020   Other Other (See Comments) 08/27/2020   Pravastatin Other (See Comments) 08/27/2020    Family History  Problem Relation Age of Onset    Diabetes Mother    Hypertension Mother    Heart disease Mother    Heart disease Father    Other Father        circulation problems   Cancer - Colon Child    Colon cancer Neg Hx    Liver disease Neg Hx    Esophageal cancer Neg Hx       Physical Exam: General:   Alert,  well-nourished, pleasant and cooperative in NAD Head:  Normocephalic and atraumatic. Eyes:  Sclera clear, no icterus.   Conjunctiva pink. Ears:  Normal auditory acuity. Nose:  No deformity, discharge,  or lesions. Mouth:  No deformity or lesions.   Neck:  Supple; no masses or thyromegaly. Lungs:  Clear throughout to auscultation.   No wheezes. Heart:  Regular rate and rhythm; no murmurs. Abdomen:  Soft, nontender, nondistended, normal bowel sounds,  no rebound or guarding. No hepatosplenomegaly.   Rectal:  Deferred  Msk:  Symmetrical. No boney deformities LAD: No inguinal or umbilical LAD Extremities:  No clubbing or edema. Neurologic:  Alert and  oriented x4;  grossly nonfocal Skin:  Intact without significant lesions or rashes. Psych:  Alert and cooperative. Normal mood and affect.  I spent 46 minutes, including in depth chart review, independent review of results, communicating results with the patient directly, face-to-face time with the patient, coordinating care, ordering studies and medications as appropriate, and documentation.    Sherrick Araki L. Orvan Falconer, MD, MPH 09/17/2022, 9:38 AM

## 2022-09-17 NOTE — Patient Instructions (Signed)
You have been scheduled for a colonoscopy. Please follow written instructions given to you at your visit today.  Please pick up your prep supplies at the pharmacy within the next 1-3 days. If you use inhalers (even only as needed), please bring them with you on the day of your procedure.  _______________________________________________________  If your blood pressure at your visit was 140/90 or greater, please contact your primary care physician to follow up on this.  _______________________________________________________  If you are age 77 or older, your body mass index should be between 23-30. Your Body mass index is 29.26 kg/m. If this is out of the aforementioned range listed, please consider follow up with your Primary Care Provider.  If you are age 6 or younger, your body mass index should be between 19-25. Your Body mass index is 29.26 kg/m. If this is out of the aformentioned range listed, please consider follow up with your Primary Care Provider.   ________________________________________________________  The [facility] providers would like to encourage you to use [facility] to communicate with providers for non-urgent requests or questions.  Due to long hold times on the telephone, sending your provider a message by [facility] may be a faster and more efficient way to get a response.  Please allow 48 business hours for a response.  Please remember that this is for non-urgent requests.  _______________________________________________________

## 2022-09-21 ENCOUNTER — Other Ambulatory Visit (HOSPITAL_COMMUNITY)
Admission: RE | Admit: 2022-09-21 | Discharge: 2022-09-21 | Disposition: A | Discharge status: Home / Self Care | Payer: Medicare HMO | Source: Ambulatory Visit | Attending: Obstetrics and Gynecology | Admitting: Obstetrics and Gynecology

## 2022-09-21 ENCOUNTER — Encounter: Payer: Self-pay | Admitting: Obstetrics and Gynecology

## 2022-09-21 ENCOUNTER — Ambulatory Visit (INDEPENDENT_AMBULATORY_CARE_PROVIDER_SITE_OTHER): Payer: Medicare HMO | Admitting: Obstetrics and Gynecology

## 2022-09-21 VITALS — BP 126/84 | HR 61 | Ht 62.0 in | Wt 157.0 lb

## 2022-09-21 DIAGNOSIS — N952 Postmenopausal atrophic vaginitis: Secondary | ICD-10-CM

## 2022-09-21 DIAGNOSIS — Z124 Encounter for screening for malignant neoplasm of cervix: Secondary | ICD-10-CM

## 2022-09-21 DIAGNOSIS — Z01419 Encounter for gynecological examination (general) (routine) without abnormal findings: Secondary | ICD-10-CM

## 2022-09-21 NOTE — Patient Instructions (Signed)

## 2022-09-22 DIAGNOSIS — Z8249 Family history of ischemic heart disease and other diseases of the circulatory system: Secondary | ICD-10-CM | POA: Diagnosis not present

## 2022-09-22 DIAGNOSIS — I25119 Atherosclerotic heart disease of native coronary artery with unspecified angina pectoris: Secondary | ICD-10-CM | POA: Diagnosis not present

## 2022-09-22 DIAGNOSIS — G4733 Obstructive sleep apnea (adult) (pediatric): Secondary | ICD-10-CM | POA: Diagnosis not present

## 2022-09-22 DIAGNOSIS — Z833 Family history of diabetes mellitus: Secondary | ICD-10-CM | POA: Diagnosis not present

## 2022-09-22 DIAGNOSIS — I10 Essential (primary) hypertension: Secondary | ICD-10-CM | POA: Diagnosis not present

## 2022-09-22 DIAGNOSIS — Z823 Family history of stroke: Secondary | ICD-10-CM | POA: Diagnosis not present

## 2022-09-22 DIAGNOSIS — Z008 Encounter for other general examination: Secondary | ICD-10-CM | POA: Diagnosis not present

## 2022-09-22 DIAGNOSIS — K5900 Constipation, unspecified: Secondary | ICD-10-CM | POA: Diagnosis not present

## 2022-09-23 LAB — CYTOLOGY - PAP: Diagnosis: NEGATIVE

## 2022-09-24 ENCOUNTER — Telehealth: Payer: Self-pay | Admitting: Neurology

## 2022-09-24 NOTE — Telephone Encounter (Signed)
HST- [employer] medic[employer] no auth req.  Patient is scheduled at [facility] for 10/13/22 at 10 AM.  Mailed packet to the patient.

## 2022-10-13 ENCOUNTER — Ambulatory Visit: Payer: Medicare HMO | Admitting: Neurology

## 2022-10-13 DIAGNOSIS — G4733 Obstructive sleep apnea (adult) (pediatric): Secondary | ICD-10-CM | POA: Diagnosis not present

## 2022-10-13 DIAGNOSIS — Z9889 Other specified postprocedural states: Secondary | ICD-10-CM

## 2022-10-13 DIAGNOSIS — Z9682 Presence of neurostimulator: Secondary | ICD-10-CM

## 2022-10-15 NOTE — Progress Notes (Signed)
[facility]  Jessica Gibson 77 year old female June 20, 1945   HOME SLEEP TEST REPORT ( by Watch PAT)   STUDY DATE:  10-15-2022   ORDERING CLINICIAN: Melvyn Novas, MD  REFERRING CLINICIAN: Dr Jarome Matin    CLINICAL INFORMATION/HISTORY: Jessica Gibson is a 77 y.o. female patient who is here for revisit 09/03/2022 for  INSPIRE follow up, now on 1.7 Volts. .  Chief concern according to patient :  none  Patient saw Dr. Vickey Huger on 09/03/2022  The Epworth sleepiness score was endorsed at 5 out of 24 points, neck circumference is 15 inches, BMI is 29.6.   FINDINGS:   Sleep Summary:   Total Recording Time (hours, min):     8 hours 44 minutes   Total Sleep Time (hours, min):           7 hours 39 minutes      Percent REM (%):        19.4%                                Respiratory Indices:   Calculated pAHI (per hour):    Scored by AASM criteria this patient has a apnea-hypopnea index of 36.8/h and an REM AHI of 42.5/h.  Following CMS criteria the AHI is 22.1/h REM AHI 24.3/h.                                                Positional AHI:    The patient slept mostly in left lateral sleep position her AHI was 33.7/h using a 3% scoring set and 18.8/h using the CMS scoring criteria.  In supine sleep there were similar AHI level noticed in right lateral sleep the AHI was 68.4/h.  Snoring level reached a mean volume of 41 dB and was present for over 50% of the recorded time based on vibration sensor data by chest electrode.                                             Oxygen Saturation Statistics:      O2 Saturation Range (%):   Between a nadir at 85% a maximum of 98% with a mean saturation of 91%.                                    O2 Saturation (minutes) <89%: 23.6 minutes  O2 saturation in minutes<90%: 68.3 minutes or 15% of total sleep time.         Pulse Rate Statistics:   Pulse Mean (bpm):   56 bpm              Pulse Range:    Between 48 and 79 bpm.              IMPRESSION:  This HST confirms the presence of severe sleep apnea with oxygen desaturation and spite of the patient using an inspire device at 1.7 V setting.  Using CMS criteria this was still moderate severe obstructive sleep apnea  This patient may  benefit from additional oxygen at night.    RECOMMENDATION: The patient should return for an attended titration study to the sleep lab with a goal of increasing the voltage to a therapeutic level.  It may well be possible that the patient also requires additional oxygen.    INTERPRETING PHYSICIAN:   Melvyn Novas, MD   [facility].

## 2022-10-20 NOTE — Procedures (Signed)
[facility]  Jessica Gibson 77 year old female 1945-10-10   HOME SLEEP TEST REPORT ( by Watch PAT)  while on INSPIRE therapy.  STUDY DATE:  10-15-2022   ORDERING CLINICIAN: Melvyn Novas, MD  REFERRING CLINICIAN: Dr Jarome Matin , Dr Jenne Pane, MD ENT    CLINICAL INFORMATION/HISTORY: Jessica Gibson is a 77 y.o. female patient who is here for revisit 09/03/2022 for  INSPIRE follow up, now on 1.7 Volts. .  Chief concern according to patient :  none  Patient saw Dr. Vickey Huger on 09/03/2022  The Epworth sleepiness score was endorsed at 5 out of 24 points, neck circumference is 15 inches, BMI is 29.6.   FINDINGS:   Sleep Summary:   Total Recording Time (hours, min):     8 hours 44 minutes   Total Sleep Time (hours, min):           7 hours 39 minutes      Percent REM (%):        19.4%                                Respiratory Indices:   Calculated pAHI (per hour):    Scored by AASM criteria this patient has a apnea-hypopnea index of 36.8/h and an REM AHI of 42.5/h.  Following CMS criteria the AHI is 22.1/h REM AHI 24.3/h.                                                Positional AHI:    The patient slept mostly in left lateral sleep position her AHI was 33.7/h using a 3% scoring set and 18.8/h using the CMS scoring criteria.  In supine sleep there were similar AHI level noticed in right lateral sleep the AHI was 68.4/h.  Snoring level reached a mean volume of 41 dB and was present for over 50% of the recorded time based on vibration sensor data by chest electrode.                                             Oxygen Saturation Statistics:      O2 Saturation Range (%):   Between a nadir at 85% a maximum of 98% with a mean saturation of 91%.                                    O2 Saturation (minutes) <89%: 23.6 minutes  O2 saturation in minutes<90%: 68.3 minutes or 15% of total sleep time.         Pulse Rate Statistics:   Pulse Mean (bpm):   56 bpm              Pulse  Range:    Between 48 and 79 bpm.             IMPRESSION:  This HST confirms the presence of severe sleep apnea with oxygen desaturation and spite of the patient using an inspire device at 1.7 V setting.  Using CMS criteria this was still moderate severe obstructive sleep apnea  This  patient may benefit from additional oxygen at night.    RECOMMENDATION: The patient should return for an attended titration study to the sleep lab with a goal of increasing the voltage to a therapeutic level.  It may well be possible that the patient also requires additional oxygen.    INTERPRETING PHYSICIAN:   Melvyn Novas, MD   [facility] at [facility].

## 2022-10-21 ENCOUNTER — Encounter: Payer: Self-pay | Admitting: Internal Medicine

## 2022-11-03 ENCOUNTER — Ambulatory Visit (AMBULATORY_SURGERY_CENTER): Payer: Medicare HMO | Admitting: Internal Medicine

## 2022-11-03 ENCOUNTER — Encounter: Payer: Self-pay | Admitting: Internal Medicine

## 2022-11-03 VITALS — BP 115/47 | HR 56 | Temp 98.6°F | Resp 12 | Ht 62.0 in | Wt 160.0 lb

## 2022-11-03 DIAGNOSIS — R194 Change in bowel habit: Secondary | ICD-10-CM

## 2022-11-03 DIAGNOSIS — D128 Benign neoplasm of rectum: Secondary | ICD-10-CM

## 2022-11-03 DIAGNOSIS — D120 Benign neoplasm of cecum: Secondary | ICD-10-CM | POA: Diagnosis not present

## 2022-11-03 DIAGNOSIS — Z800 Family history of malignant neoplasm of digestive organs: Secondary | ICD-10-CM | POA: Diagnosis not present

## 2022-11-03 DIAGNOSIS — K621 Rectal polyp: Secondary | ICD-10-CM | POA: Diagnosis not present

## 2022-11-03 DIAGNOSIS — G4733 Obstructive sleep apnea (adult) (pediatric): Secondary | ICD-10-CM | POA: Diagnosis not present

## 2022-11-03 MED ORDER — SODIUM CHLORIDE 0.9 % IV SOLN
500.0000 mL | Freq: Once | INTRAVENOUS | Status: DC
Start: 2022-11-03 — End: 2022-11-03

## 2022-11-03 NOTE — Op Note (Signed)
[facility] Patient Name: Jessica Gibson Procedure Date: 11/03/2022 8:42 AM MRN: [id number] Endoscopist: Madelyn Brunner Rosebud , , [id number] Age: 77 Referring MD:  Date of Birth: 1946/04/28 Gender: Female Account #: [id number] Procedure:                Colonoscopy Indications:              Change in bowel habits, family history of colon                            cancer Medicines:                Monitored Anesthesia Care Procedure:                Pre-Anesthesia Assessment:                           - Prior to the procedure, a History and Physical                            was performed, and patient medications and                            allergies were reviewed. The patient's tolerance of                            previous anesthesia was also reviewed. The risks                            and benefits of the procedure and the sedation                            options and risks were discussed with the patient.                            All questions were answered, and informed consent                            was obtained. Prior Anticoagulants: The patient has                            taken no anticoagulant or antiplatelet agents. ASA                            Grade Assessment: II - A patient with mild systemic                            disease. After reviewing the risks and benefits,                            the patient was deemed in satisfactory condition to                            undergo the procedure.  After obtaining informed consent, the colonoscope                            was passed under direct vision. Throughout the                            procedure, the patient's blood pressure, pulse, and                            oxygen saturations were monitored continuously. The                            CF [id number] #[id number] was introduced through the anus                            and advanced to the the terminal ileum. The                             colonoscopy was performed without difficulty. The                            patient tolerated the procedure well. The quality                            of the bowel preparation was good. The terminal                            ileum, ileocecal valve, appendiceal orifice, and                            rectum were photographed. Scope In: 8:51:01 AM Scope Out: 9:08:31 AM Scope Withdrawal Time: 0 hours 13 minutes 35 seconds  Total Procedure Duration: 0 hours 17 minutes 30 seconds  Findings:                 The terminal ileum appeared normal.                           Five sessile polyps were found in the cecum. The                            polyps were 3 to 5 mm in size. These polyps were                            removed with a cold snare. Resection and retrieval                            were complete.                           Multiple diverticula were found in the sigmoid                            colon, descending colon and transverse colon.  A 7 mm polyp was found in the rectum. The polyp was                            sessile. The polyp was removed with a cold snare.                            Resection and retrieval were complete.                           Non-bleeding internal hemorrhoids were found during                            retroflexion. Complications:            No immediate complications. Estimated Blood Loss:     Estimated blood loss was minimal. Impression:               - The examined portion of the ileum was normal.                           - Five 3 to 5 mm polyps in the cecum, removed with                            a cold snare. Resected and retrieved.                           - Diverticulosis in the sigmoid colon, in the                            descending colon and in the transverse colon.                           - One 7 mm polyp in the rectum, removed with a cold                            snare. Resected  and retrieved.                           - Non-bleeding internal hemorrhoids. Recommendation:           - Discharge patient to home (with escort).                           - Await pathology results.                           - The findings and recommendations were discussed                            with the patient. Dr Particia Lather "[name]" [name],  11/03/2022 9:12:34 AM

## 2022-11-03 NOTE — Progress Notes (Signed)
Sedate, gd SR's, VSS, report to RNSedate, gd SR's, VSS, report to [employer]

## 2022-11-03 NOTE — Progress Notes (Signed)
GASTROENTEROLOGY PROCEDURE H&P NOTE   Primary Care Physician: Garlan Fillers, MD    Reason for Procedure:   Change in bowel habits, family history of colon cancer  Plan:      Patient is appropriate for endoscopic procedure(s) in the ambulatory ([facility]) setting.  The nature of the procedure, as well as the risks, benefits, and alternatives were carefully and thoroughly reviewed with the patient. Ample time for discussion and questions allowed. The patient understood, was satisfied, and agreed to proceed.     HPI: Jessica Gibson is a 77 y.o. female who presents for colonoscopy for evaluation of change in bowel habits, family history of colon cancer .  Patient was most recently seen in the Gastroenterology Clinic on 09/17/22.  No interval change in medical history since that appointment. Please refer to that note for full details regarding GI history and clinical presentation.   Past Medical History:  Diagnosis Date   Arthritis    "wrist, shoulders; its just there "   Atrophic vaginitis    CIN I (cervical intraepithelial neoplasia I) 03/2004   LEEP   Heart murmur    dx in childohood; " i have a small hole in my hart but it doesnt do anything"    History of kidney stones    "i had 1 kidney at age 89 and at that time they went up, put a basket and retrieved "    Hx of vertigo    "3 to 4 months of it; but currently no symptoms becasue i used the epley maneuver"    Hypercholesteremia    cant tolerate statins; just watching diet ; currently on weight watchers   Hypertension    Nasal turbinate hypertrophy    dx per Dr. Jenne Pane ; uses nasal spray for relief    OSA (obstructive sleep apnea)    initial dx in 2015 as mild; retest 07-09-17 dx with SEVERE OSA ; no current CPAP use, per Neuro Dr Richardean Chimera plan to complete CPAP titration (see 07-19-17 telephone note in epic)   Other malaise and fatigue 02/28/2014   Post-operative nausea and vomiting    Snoring 02/28/2014    Past Surgical  History:  Procedure Laterality Date   ARTHROSCOPY OF KNEE Right    same practice as Dr [facility]    CERVICAL BIOPSY  W/ LOOP ELECTRODE EXCISION  2005   done with Dr Domingo Dimes ; [facility]. same practice as Dr Audie Box; reports : " i guess it was benign because we didnt have to do anything"    CYSTOSCOPY     DILATATION & CURETTAGE/HYSTEROSCOPY WITH MYOSURE N/A 08/25/2017   Procedure: DILATATION & CURETTAGE/HYSTEROSCOPY WITH MYOSURE;  Surgeon: Dara Lords, MD;  Location: [facility];  Service: Gynecology;  Laterality: N/A;  request to follow around 10:15am in [facility] block time. requests one hour OR time.   DRUG INDUCED ENDOSCOPY N/A 12/06/2020   Procedure: DRUG INDUCED ENDOSCOPY;  Surgeon: Christia Reading, MD;  Location: [facility] OR;  Service: ENT;  Laterality: N/A;   IMPLANTATION OF HYPOGLOSSAL NERVE STIMULATOR Right 09/30/2021   Procedure: IMPLANTATION OF HYPOGLOSSAL NERVE STIMULATOR;  Surgeon: Christia Reading, MD;  Location: [facility];  Service: ENT;  Laterality: Right;   NASAL TURBINATE REDUCTION Bilateral 12/06/2020   Procedure: TURBINATE REDUCTION/SUBMUCOSAL RESECTION;  Surgeon: Christia Reading, MD;  Location: [facility] OR;  Service: ENT;  Laterality: Bilateral;   REPLACEMENT TOTAL KNEE Right 2009   dr Lequita Halt  sleep apnea device  12/2021    Prior to Admission medications   Medication Sig Start Date End Date Taking? Authorizing Provider  acetaminophen (TYLENOL) 650 MG CR tablet Take 650 mg by mouth every 8 (eight) hours as needed for pain.   Yes [provider]  amLODipine (NORVASC) 10 MG tablet Take 10 mg by mouth daily. 02/14/21  Yes [provider]  Cholecalciferol (VITAMIN D) 50 MCG (2000 UT) CAPS Take 1 capsule Orally Once a day for 90 days 09/22/21  Yes [provider]  losartan-hydrochlorothiazide (HYZAAR) 100-12.5 MG tablet Take 1 tablet by mouth daily. 03/29/18  Yes [provider]   metoprolol tartrate (LOPRESSOR) 50 MG tablet Take 50 mg by mouth 2 (two) times daily. 04/04/18  Yes [provider]  minoxidil (LONITEN) 10 MG tablet Take 10 mg by mouth 2 (two) times daily. 02/13/21  Yes [provider]  ibuprofen (ADVIL,MOTRIN) 200 MG tablet Take 400 mg by mouth every 6 (six) hours as needed for moderate pain (pain).    [provider]  zaleplon (SONATA) 5 MG capsule 1 capsule at bedtime as needed Orally Once a day for 30 days 03/24/22   [provider]    Current Outpatient Medications  Medication Sig Dispense Refill   acetaminophen (TYLENOL) 650 MG CR tablet Take 650 mg by mouth every 8 (eight) hours as needed for pain.     amLODipine (NORVASC) 10 MG tablet Take 10 mg by mouth daily.     Cholecalciferol (VITAMIN D) 50 MCG (2000 UT) CAPS Take 1 capsule Orally Once a day for 90 days     losartan-hydrochlorothiazide (HYZAAR) 100-12.5 MG tablet Take 1 tablet by mouth daily.     metoprolol tartrate (LOPRESSOR) 50 MG tablet Take 50 mg by mouth 2 (two) times daily.     minoxidil (LONITEN) 10 MG tablet Take 10 mg by mouth 2 (two) times daily.     ibuprofen (ADVIL,MOTRIN) 200 MG tablet Take 400 mg by mouth every 6 (six) hours as needed for moderate pain (pain).     zaleplon (SONATA) 5 MG capsule 1 capsule at bedtime as needed Orally Once a day for 30 days     Current Facility-Administered Medications  Medication Dose Route Frequency Provider Last Rate Last Admin   0.9 %  sodium chloride infusion  500 mL Intravenous Once Imogene Burn, MD        Allergies as of 11/03/2022 - Review Complete 11/03/2022  Allergen Reaction Noted   Ezetimibe Other (See Comments) 08/27/2020   Labetalol hcl Other (See Comments) 08/27/2020   Other Other (See Comments) 08/27/2020   Pravastatin Other (See Comments) 08/27/2020    Family History  Problem Relation Age of Onset   Diabetes Mother    Hypertension Mother    Heart disease Mother    Heart disease Father     Other Father        circulation problems   Cancer - Colon Child    Colon cancer Neg Hx    Liver disease Neg Hx    Esophageal cancer Neg Hx     Social History   Socioeconomic History   Marital status: Widowed    Spouse name: Not on file   Number of children: 2   Years of education: College   Highest education level: Not on file  Occupational History    Employer: SELF EMPLOYED   Occupation: retired  Tobacco Use   Smoking status: Never   Smokeless tobacco: Never  Vaping  Use   Vaping Use: Never used  Substance and Sexual Activity   Alcohol use: Yes    Alcohol/week: 0.0 standard drinks of alcohol    Comment: 2 drinks/month   Drug use: No   Sexual activity: Not Currently    Birth control/protection: Post-menopausal    Comment: 1st intercourse 77 yo-Fewer than 5 partners  Other Topics Concern   Not on file  Social History Narrative   Patient is widowed and lives alone.   Patient has two adult children.   Patient is self-employed, semi-retired.   Patient is right-handed.   Patient drinks three cups of coffee daily.   Patient has some college education.   Social Determinants of Health   Financial Resource Strain: Not on file  Food Insecurity: Not on file  Transportation Needs: Not on file  Physical Activity: Not on file  Stress: Not on file  Social Connections: Not on file  Intimate Partner Violence: Not on file    Physical Exam: Vital signs in last 24 hours: BP (!) 159/70   Pulse 60   Temp 98.6 F (37 C)   Ht 5\' 2"  (1.575 m)   Wt 160 lb (72.6 kg)   SpO2 94%   BMI 29.26 kg/m  GEN: NAD EYE: Sclerae anicteric ENT: MMM CV: Non-tachycardic Pulm: No increased WOB GI: Soft NEURO:  Alert & Oriented   Eulah Pont, MD [facility]   11/03/2022 8:04 AM

## 2022-11-03 NOTE — Patient Instructions (Addendum)
Discharge patient to home (with escort).                           - Await pathology results.                           - The findings and recommendations were discussed                            with the patient.   Handout on polyps, diverticulosis and hemorrhoids given.   YOU HAD AN ENDOSCOPIC PROCEDURE TODAY AT THE [facility]:   Refer to the procedure report that was given to you for any specific questions about what was found during the examination.  If the procedure report does not answer your questions, please call your gastroenterologist to clarify.  If you requested that your care partner not be given the details of your procedure findings, then the procedure report has been included in a sealed envelope for you to review at your convenience later.  YOU SHOULD EXPECT: Some feelings of bloating in the abdomen. Passage of more gas than usual.  Walking can help get rid of the air that was put into your GI tract during the procedure and reduce the bloating. If you had a lower endoscopy (such as a colonoscopy or flexible sigmoidoscopy) you may notice spotting of blood in your stool or on the toilet paper. If you underwent a bowel prep for your procedure, you may not have a normal bowel movement for a few days.  Please Note:  You might notice some irritation and congestion in your nose or some drainage.  This is from the oxygen used during your procedure.  There is no need for concern and it should clear up in a day or so.  SYMPTOMS TO REPORT IMMEDIATELY:  Following lower endoscopy (colonoscopy or flexible sigmoidoscopy):  Excessive amounts of blood in the stool  Significant tenderness or worsening of abdominal pains  Swelling of the abdomen that is new, acute  Fever of 100F or higher   For urgent or emergent issues, a gastroenterologist can be reached at any hour by calling [phone]. Do not use MyChart messaging for urgent concerns.    DIET:  We do recommend a  small meal at first, but then you may proceed to your regular diet.  Drink plenty of fluids but you should avoid alcoholic beverages for 24 hours.  ACTIVITY:  You should plan to take it easy for the rest of today and you should NOT DRIVE or use heavy machinery until tomorrow (because of the sedation medicines used during the test).    FOLLOW UP: Our staff will call the number listed on your records the next business day following your procedure.  We will call around 7:15- 8:00 am to check on you and address any questions or concerns that you may have regarding the information given to you following your procedure. If we do not reach you, we will leave a message.     If any biopsies were taken you will be contacted by phone or by letter within the next 1-3 weeks.  Please call us at [phone] if you have not heard about the biopsies in 3 weeks.    SIGNATURES/CONFIDENTIALITY: You and/or your care partner have signed paperwork which will be entered into your electronic medical record.  These signatures  attest to the fact that that the information above on your After Visit Summary has been reviewed and is understood.  Full responsibility of the confidentiality of this discharge information lies with you and/or your care-partner.

## 2022-11-04 ENCOUNTER — Telehealth: Payer: Self-pay | Admitting: *Deleted

## 2022-11-04 NOTE — Telephone Encounter (Signed)
  Follow up Call-    Row Labels 11/03/2022    7:31 AM  Call back number   Section Header. No data exists in this row.   Post procedure Call Back phone  #   [phone]  Permission to leave phone message   Yes     Patient questions:  Do you have a fever, pain , or abdominal swelling? No. Pain Score  0 *  Have you tolerated food without any problems? Yes.    Have you been able to return to your normal activities? Yes.    Do you have any questions about your discharge instructions: Diet   No. Medications  No. Follow up visit  No.  Do you have questions or concerns about your Care? No.  Actions: * If pain score is 4 or above: No action needed, pain <4.

## 2022-11-05 ENCOUNTER — Encounter: Payer: Self-pay | Admitting: Internal Medicine

## 2022-11-12 DIAGNOSIS — I10 Essential (primary) hypertension: Secondary | ICD-10-CM | POA: Diagnosis not present

## 2022-11-12 DIAGNOSIS — E559 Vitamin D deficiency, unspecified: Secondary | ICD-10-CM | POA: Diagnosis not present

## 2022-11-12 DIAGNOSIS — K219 Gastro-esophageal reflux disease without esophagitis: Secondary | ICD-10-CM | POA: Diagnosis not present

## 2022-11-12 DIAGNOSIS — E785 Hyperlipidemia, unspecified: Secondary | ICD-10-CM | POA: Diagnosis not present

## 2022-11-12 DIAGNOSIS — R7989 Other specified abnormal findings of blood chemistry: Secondary | ICD-10-CM | POA: Diagnosis not present

## 2022-11-18 ENCOUNTER — Telehealth: Payer: Self-pay | Admitting: Neurology

## 2022-11-18 ENCOUNTER — Encounter: Payer: Self-pay | Admitting: Neurology

## 2022-11-18 ENCOUNTER — Ambulatory Visit: Payer: Medicare HMO | Admitting: Neurology

## 2022-11-18 VITALS — Ht 62.0 in | Wt 158.8 lb

## 2022-11-18 DIAGNOSIS — Z9682 Presence of neurostimulator: Secondary | ICD-10-CM | POA: Diagnosis not present

## 2022-11-18 DIAGNOSIS — J300 Vasomotor rhinitis: Secondary | ICD-10-CM | POA: Diagnosis not present

## 2022-11-18 NOTE — Telephone Encounter (Signed)
Patient came by the office for an inspire check up.   Patient stated that she was experiencing severe allergies at the time of her home sleep study.   Checked current voltage of 1.7 and patient had great tongue protrusion. Did not change any settings at this time and recommended a repeat of the home sleep study.

## 2022-11-18 NOTE — Progress Notes (Addendum)
Provider:  Melvyn Novas, MD  Primary Care Physician:  Garlan Fillers, MD [address] [place] [address]     Referring Provider: Garlan Fillers, Md [address] [place],  [place] [address]          Chief Complaint according to patient   Patient presents with:     Hypoglossal Nerve stimulator patient:            HISTORY OF PRESENT ILLNESS:  Jessica Gibson is a 77 y.o. female patient who is here for revisit 11/18/2022 for  INSPIRE sleep study follow up. Chief concern according to patient :  patient feels her HST was non-valid as she suffered form a ca old and did not breathe well at baseline.  Patient came by the office for an inspire check up.    Patient stated that she was experiencing severe allergies at the time of her home sleep study.    Checked current voltage of 1.7 and patient had great tongue protrusion. Did not change any settings at this time and recommended a repeat of the home sleep study.                           Here are the result of HST while on Inspire:     Total Sleep Time (hours, min):           7 hours 39 minutes      Percent REM (%):        19.4%                                Respiratory Indices:   Calculated pAHI (per hour):    Scored by AASM criteria this patient has a apnea-hypopnea index of 36.8/h and an REM AHI of 42.5/h.  Following CMS criteria the AHI is 22.1/h REM AHI 24.3/h.                                                Positional AHI:    The patient slept mostly in left lateral sleep position her AHI was 33.7/h using a 3% scoring set and 18.8/h using the CMS scoring criteria.  In supine sleep there were similar AHI level noticed in right lateral sleep the AHI was 68.4/h.   Snoring level reached a mean volume of 41 dB and was present for over 50% of the recorded time based on vibration sensor data by chest electrode.                                             Oxygen Saturation Statistics:      O2  Saturation Range (%):   Between a nadir at 85% a maximum of 98% with a mean saturation of 91%.                                    O2 Saturation (minutes) <89%: 23.6 minutes   O2 saturation in minutes<90%: 68.3 minutes or 15% of total sleep time.  Pulse Rate Statistics:   Pulse Mean (bpm):   56 bpm              Pulse Range:    Between 48 and 79 bpm.              Review of Systems: Out of a complete 14 system review, the patient complains of only the following symptoms, and all other reviewed systems are negative.:     Social History   Socioeconomic History   Marital status: Widowed    Spouse name: Not on file   Number of children: 2   Years of education: College   Highest education level: Not on file  Occupational History    Employer: SELF EMPLOYED   Occupation: retired  Tobacco Use   Smoking status: Never   Smokeless tobacco: Never  Vaping Use   Vaping Use: Never used  Substance and Sexual Activity   Alcohol use: Yes    Alcohol/week: 0.0 standard drinks of alcohol    Comment: 2 drinks/month   Drug use: No   Sexual activity: Not Currently    Birth control/protection: Post-menopausal    Comment: 1st intercourse 77 yo-Fewer than 5 partners  Other Topics Concern   Not on file  Social History Narrative   Patient is widowed and lives alone.   Patient has two adult children.   Patient is self-employed, semi-retired.   Patient is right-handed.   Patient drinks three cups of coffee daily.   Patient has some college education.   Social Determinants of Health   Financial Resource Strain: Not on file  Food Insecurity: Not on file  Transportation Needs: Not on file  Physical Activity: Not on file  Stress: Not on file  Social Connections: Not on file    Family History  Problem Relation Age of Onset   Diabetes Mother    Hypertension Mother    Heart disease Mother    Heart disease Father    Other Father        circulation problems   Cancer - Colon Child     Colon cancer Neg Hx    Liver disease Neg Hx    Esophageal cancer Neg Hx     Past Medical History:  Diagnosis Date   Arthritis    "wrist, shoulders; its just there "   Atrophic vaginitis    CIN I (cervical intraepithelial neoplasia I) 03/2004   LEEP   Heart murmur    dx in childohood; " i have a small hole in my hart but it doesnt do anything"    History of kidney stones    "i had 1 kidney at age 67 and at that time they went up, put a basket and retrieved "    Hx of vertigo    "3 to 4 months of it; but currently no symptoms becasue i used the epley maneuver"    Hypercholesteremia    cant tolerate statins; just watching diet ; currently on weight watchers   Hypertension    Nasal turbinate hypertrophy    dx per Dr. Jenne Pane ; uses nasal spray for relief    OSA (obstructive sleep apnea)    initial dx in 2015 as mild; retest 07-09-17 dx with SEVERE OSA ; no current CPAP use, per Neuro Dr Richardean Chimera plan to complete CPAP titration (see 07-19-17 telephone note in epic)   Other malaise and fatigue 02/28/2014   Post-operative nausea and vomiting    Snoring 02/28/2014    Past Surgical History:  Procedure Laterality Date   ARTHROSCOPY OF KNEE Right    same practice as Dr [facility]    CERVICAL BIOPSY  W/ LOOP ELECTRODE EXCISION  2005   done with Dr Domingo Dimes ; [facility]. same practice as Dr Audie Box; reports : " i guess it was benign because we didnt have to do anything"    CYSTOSCOPY     DILATATION & CURETTAGE/HYSTEROSCOPY WITH MYOSURE N/A 08/25/2017   Procedure: DILATATION & CURETTAGE/HYSTEROSCOPY WITH MYOSURE;  Surgeon: Dara Lords, MD;  Location: [facility];  Service: Gynecology;  Laterality: N/A;  request to follow around 10:15am in [facility] block time. requests one hour OR time.   DRUG INDUCED ENDOSCOPY N/A 12/06/2020   Procedure: DRUG INDUCED ENDOSCOPY;  Surgeon: Christia Reading, MD;  Location: [facility] OR;  Service: ENT;  Laterality:  N/A;   IMPLANTATION OF HYPOGLOSSAL NERVE STIMULATOR Right 09/30/2021   Procedure: IMPLANTATION OF HYPOGLOSSAL NERVE STIMULATOR;  Surgeon: Christia Reading, MD;  Location: [facility];  Service: ENT;  Laterality: Right;   NASAL TURBINATE REDUCTION Bilateral 12/06/2020   Procedure: TURBINATE REDUCTION/SUBMUCOSAL RESECTION;  Surgeon: Christia Reading, MD;  Location: [facility] OR;  Service: ENT;  Laterality: Bilateral;   REPLACEMENT TOTAL KNEE Right 2009   dr Lequita Halt    sleep apnea device  12/2021     Current Outpatient Medications on File Prior to Visit  Medication Sig Dispense Refill   acetaminophen (TYLENOL) 650 MG CR tablet Take 650 mg by mouth every 8 (eight) hours as needed for pain.     amLODipine (NORVASC) 10 MG tablet Take 10 mg by mouth daily.     Cholecalciferol (VITAMIN D) 50 MCG (2000 UT) CAPS Take 1 capsule Orally Once a day for 90 days     ibuprofen (ADVIL,MOTRIN) 200 MG tablet Take 400 mg by mouth every 6 (six) hours as needed for moderate pain (pain).     losartan-hydrochlorothiazide (HYZAAR) 100-12.5 MG tablet Take 1 tablet by mouth daily.     metoprolol tartrate (LOPRESSOR) 50 MG tablet Take 50 mg by mouth 2 (two) times daily.     minoxidil (LONITEN) 10 MG tablet Take 10 mg by mouth 2 (two) times daily.     zaleplon (SONATA) 5 MG capsule 1 capsule at bedtime as needed Orally Once a day for 30 days     No current facility-administered medications on file prior to visit.    Allergies  Allergen Reactions   Ezetimibe Other (See Comments)    Joint pain   Labetalol Hcl Other (See Comments)    Took way taste   Other Other (See Comments)   Pravastatin Other (See Comments)    Joint pain     DIAGNOSTIC DATA (LABS, IMAGING, TESTING) - I reviewed patient records, labs, notes, testing and imaging myself where available.  Lab Results  Component Value Date   WBC 6.2 12/06/2020   HGB 13.8 12/06/2020   HCT 42.2 12/06/2020   MCV 89.4 12/06/2020   PLT 336 12/06/2020       Component Value Date/Time   NA 139 09/24/2021 1231   K 4.4 09/24/2021 1231   CL 104 09/24/2021 1231   CO2 27 09/24/2021 1231   GLUCOSE 99 09/24/2021 1231   BUN 19 09/24/2021 1231   CREATININE 0.90 09/24/2021 1231   CALCIUM 9.8 09/24/2021 1231   PROT 7.5 08/12/2017 1100   ALBUMIN 4.1 08/12/2017 1100   AST 20 08/12/2017 1100   ALT 16 08/12/2017 1100  ALKPHOS 89 08/12/2017 1100   BILITOT 0.7 08/12/2017 1100   GFRNONAA >60 09/24/2021 1231   GFRAA >60 08/12/2017 1100   No results found for: "CHOL", "HDL", "LDLCALC", "LDLDIRECT", "TRIG", "CHOLHDL" No results found for: "HGBA1C" No results found for: "VITAMINB12" No results found for: "TSH"  PHYSICAL EXAM:  Today's Vitals   11/18/22 1131  Weight: 158 lb 12.8 oz (72 kg)  Height: 5\' 2"  (1.575 m)   Body mass index is 29.04 kg/m.   Wt Readings from Last 3 Encounters:  11/18/22 158 lb 12.8 oz (72 kg)  11/03/22 160 lb (72.6 kg)  09/21/22 157 lb (71.2 kg)     Ht Readings from Last 3 Encounters:  11/18/22 5\' 2"  (1.575 m)  11/03/22 5\' 2"  (1.575 m)  09/21/22 5\' 2"  (1.575 m)      General: The patient is awake, alert and appears not in acute distress. The patient is well groomed. Head: Normocephalic, atraumatic. Neck is supple.   Tongue protrusion at 1.7 V was noted.    ASSESSMENT AND PLAN 77 y.o. year old female  here with:    1) very high AHI while on Inspire device. Patient reports she had a severe cold and this may affect the outcome. I will order a repeat HST now that her cold has passed.   HST by watch pat  I plan to follow up either personally or through our NP within 3-4 months.   I would like to thank Garlan Fillers, MD for allowing me to meet with and to take care of this pleasant patient.    Electronically signed by: Melvyn Novas, MD 11/18/2022 12:19 PM  [facility] Board certified by The [employer] of Sleep Medicine and Diplomate of the [employer] of  Sleep Medicine. Board certified In Neurology through the ABPN, Fellow of the [employer] of Neurology.

## 2022-11-19 DIAGNOSIS — E669 Obesity, unspecified: Secondary | ICD-10-CM | POA: Diagnosis not present

## 2022-11-19 DIAGNOSIS — E785 Hyperlipidemia, unspecified: Secondary | ICD-10-CM | POA: Diagnosis not present

## 2022-11-19 DIAGNOSIS — M791 Myalgia, unspecified site: Secondary | ICD-10-CM | POA: Diagnosis not present

## 2022-11-19 DIAGNOSIS — I10 Essential (primary) hypertension: Secondary | ICD-10-CM | POA: Diagnosis not present

## 2022-11-19 DIAGNOSIS — Z0000 Encounter for general adult medical examination without abnormal findings: Secondary | ICD-10-CM | POA: Diagnosis not present

## 2022-11-19 DIAGNOSIS — Z1331 Encounter for screening for depression: Secondary | ICD-10-CM | POA: Diagnosis not present

## 2022-11-19 DIAGNOSIS — M25562 Pain in left knee: Secondary | ICD-10-CM | POA: Diagnosis not present

## 2022-11-19 DIAGNOSIS — R82998 Other abnormal findings in urine: Secondary | ICD-10-CM | POA: Diagnosis not present

## 2022-11-19 DIAGNOSIS — G8929 Other chronic pain: Secondary | ICD-10-CM | POA: Diagnosis not present

## 2022-11-19 DIAGNOSIS — Z1339 Encounter for screening examination for other mental health and behavioral disorders: Secondary | ICD-10-CM | POA: Diagnosis not present

## 2022-11-19 DIAGNOSIS — G4733 Obstructive sleep apnea (adult) (pediatric): Secondary | ICD-10-CM | POA: Diagnosis not present

## 2022-12-23 ENCOUNTER — Ambulatory Visit (INDEPENDENT_AMBULATORY_CARE_PROVIDER_SITE_OTHER): Payer: Medicare HMO | Admitting: Neurology

## 2022-12-23 DIAGNOSIS — G4733 Obstructive sleep apnea (adult) (pediatric): Secondary | ICD-10-CM

## 2022-12-23 DIAGNOSIS — Z9682 Presence of neurostimulator: Secondary | ICD-10-CM

## 2022-12-23 DIAGNOSIS — G4700 Insomnia, unspecified: Secondary | ICD-10-CM

## 2022-12-23 DIAGNOSIS — J300 Vasomotor rhinitis: Secondary | ICD-10-CM

## 2022-12-30 ENCOUNTER — Other Ambulatory Visit: Payer: Self-pay | Admitting: Neurology

## 2022-12-30 NOTE — Procedures (Signed)
[facility] SLEEP TEST REPORT ( by Watch PAT)   STUDY DATE: December 23, 2022 DOB: 08/19/1945 MRN: Yvett Rossel. Jules Husbands, [id number]   ORDERING CLINICIAN: Melvyn Novas, MD  REFERRING CLINICIAN:    CLINICAL INFORMATION/HISTORY: This is a repeat home sleep test for this inspire patient.  Please read the June 2024 notes for further instruction   Epworth sleepiness score: 6/24.   BMI: 29.6 kg/m   Neck Circumference: 15"   FINDINGS:   Sleep Summary:   Total Recording Time (hours, min): 9 hours 49 minutes      Total Sleep Time (hours, min):    8 hours 35 minutes             Percent REM (%):     18.9%     There were 64 minutes of wake after sleep onset time, REM sleep latency was 114 minutes, sleep latency was 10 minutes, there were frequent interruptions of sleep with 21 wakefulness episodes.                                Respiratory Indices by AASM criteria:   Calculated pAHI (per hour):    26.2/h    adjusted to CMS criteria this is an AHI of 14.6 with an REM AHI of 10.5 and a non-REM AHI of 15.5/h.                     REM pAHI:    19.2/h                                             NREM pAHI:   27.8 per hour                           Supine AHI: The patient reached an AHI in supine sleep of 42.2/h and in nonsupine sleep of 10.8/h.  She slept mostly on her left side with an AHI of 9.2/h following CMS criteria.  If we apply AASM criteria supine sleep was associated with an AHI of 63.9/h and nonsupine sleep with an AHI of 21.9/h.  Snoring threshold she reached a mean volume of 42 dB and this was present for two thirds of the total sleep time.                                                    Oxygen Saturation Statistics:      O2 Saturation Range (%):   Between a nadir of 84 and a maximum saturation of 97 with a mean saturation of 90%                                    O2 Saturation (minutes) <89%:    86.6 minutes, this is severe hypoxia unless there is an  artifact present.       Pulse Rate Statistics:   Pulse Mean (bpm): 57 bpm               Pulse Range:   Between  46 and 87 bpm              IMPRESSION:  This HST confirms the presence of moderate sleep apnea while under inspire therapy.  This sleep test also was associated with a very severe hypoxia duration. This is in contrast to her sleep study from December 2022 when no hypoxia was present and her apnea was REM dependent, scored under AASM guidelines.   . See our recommendations for limited expectation of inspire efficacy.     RECOMMENDATION: This patient should return for an in lab titration of inspire and oxygen if needed.  If this home sleep test confirmed correctly that the patient has hypoxia, she does need an alternative treatment which inspire cannot provide.    INTERPRETING PHYSICIAN:   Melvyn Novas, MD

## 2022-12-30 NOTE — Progress Notes (Unsigned)
    [facility]  Guadalupe Dawn  Female, 77 y.o., [birth year]   HOME SLEEP TEST REPORT ( by Watch PAT)  Repeat test On INSPIRE_  STUDY DATE:  12-31-2022    ORDERING CLINICIAN: Melvyn Novas, MD  REFERRING CLINICIAN:  INSPIRE / Dr Paterson/ Dr Jenne Pane    CLINICAL INFORMATION/HISTORY: Repeat HST on Inspire.      Epworth sleepiness score: 6/24.   BMI 29.6: kg/m   Neck Circumference: 15"   FINDINGS:   Sleep Summary:   Total Recording Time (hours, min):        Total Sleep Time (hours, min):                 Percent REM (%):                                        Respiratory Indices:   Calculated pAHI (per hour):                             REM pAHI:                                                 NREM pAHI:                              Supine AHI:                                                   Oxygen Saturation Statistics:   Oxygen Saturation (%) Mean:               Minimum oxygen saturation (%):            O2 Saturation Range (%):                                       O2 Saturation (minutes) <89%:           Pulse Rate Statistics:   Pulse Mean (bpm):                 Pulse Range:                 IMPRESSION:  This HST confirms the presence of    RECOMMENDATION:    INTERPRETING PHYSICIAN:   Melvyn Novas, MD

## 2022-12-30 NOTE — Addendum Note (Signed)
Addended by: Melvyn Novas on: 12/30/2022 05:52 PM   Modules accepted: Orders

## 2022-12-31 ENCOUNTER — Telehealth: Payer: Self-pay | Admitting: Neurology

## 2022-12-31 NOTE — Telephone Encounter (Signed)
-----   Message from Reading Dohmeier sent at 12/30/2022  5:52 PM EDT ----- Again, poor outcome of Inspire therapy, residual apnea is high and the patient now  seems to have hypoxia.  Repeating  an in-lab sleep study and titration will be  needed, offering titration to oxygen  in the same night.  The patient will be invited for in lab sleep study

## 2022-12-31 NOTE — Telephone Encounter (Signed)
Called the patient and advised that the HST indicated there is apnea still present despite the inspire use. Informed the patient that our sleep lab manager will be in touch with the inspire representative to discuss the next steps with moving forward and then someone will be in touch with her. Pt verbalized understanding. Pt had no questions at this time but was encouraged to call back if questions arise.

## 2023-01-08 ENCOUNTER — Telehealth: Payer: Self-pay | Admitting: Neurology

## 2023-01-08 NOTE — Telephone Encounter (Signed)
[employer] pending uploaded notes.

## 2023-01-11 ENCOUNTER — Telehealth: Payer: Self-pay | Admitting: Neurology

## 2023-01-11 ENCOUNTER — Ambulatory Visit: Payer: Medicare HMO | Admitting: Neurology

## 2023-01-11 ENCOUNTER — Telehealth: Payer: Self-pay

## 2023-01-11 ENCOUNTER — Encounter: Payer: Self-pay | Admitting: Neurology

## 2023-01-11 VITALS — Ht 62.0 in | Wt 159.4 lb

## 2023-01-11 DIAGNOSIS — J300 Vasomotor rhinitis: Secondary | ICD-10-CM

## 2023-01-11 DIAGNOSIS — M25471 Effusion, right ankle: Secondary | ICD-10-CM | POA: Diagnosis not present

## 2023-01-11 DIAGNOSIS — Z9682 Presence of neurostimulator: Secondary | ICD-10-CM | POA: Diagnosis not present

## 2023-01-11 DIAGNOSIS — M25472 Effusion, left ankle: Secondary | ICD-10-CM

## 2023-01-11 DIAGNOSIS — G4733 Obstructive sleep apnea (adult) (pediatric): Secondary | ICD-10-CM | POA: Diagnosis not present

## 2023-01-11 DIAGNOSIS — G4734 Idiopathic sleep related nonobstructive alveolar hypoventilation: Secondary | ICD-10-CM

## 2023-01-11 NOTE — Progress Notes (Signed)
`                     Provider:  Melvyn Novas, MD  Primary Care Physician:  Garlan Fillers, MD [address] [place] [address]     Referring Provider: ENT surgeon, [facility].         Chief Complaint according to patient   Patient presents with:     N           HISTORY OF PRESENT ILLNESS:  Jessica Gibson is a 77 y.o. female patient who is here for revisit 01/11/2023 for  HST on INSPIRE follow up .  Chief concern according to patient :  Repeat sleep test again raises concerns about residual AHI being to high and this time was associated with hypoxia.  She complains about ongoing a insomnia while using the inspire device compliantly.  3 months ago her Norvasc/ amlodipine and since then she has ankle edema.  She endorsed the Epworth sleepiness score at 4/ 24, and FSS at 23/ 63 points.   Jessica Gibson is a 77 y.o. female patient who is here for revisit 11/18/2022 for  INSPIRE sleep study follow up. Chief concern according to patient :  patient feels her HST was non-valid as she suffered form a ca old and did not breathe well at baseline.  Patient came by the office for an inspire check up.    Patient stated that she was experiencing severe allergies at the time of her home sleep study.    Checked current voltage of 1.7 and patient had great tongue protrusion. Did not change any settings at this time and recommended a repeat of the home sleep study.        Review of Systems: Out of a complete 14 system review, the patient complains of only the following symptoms, and all other reviewed systems are negative.:  Fatigue, sleepiness , snoring, fragmented sleep, Insomnia,      How likely are you to doze in the following situations: 0 = not likely, 1 = slight chance, 2 = moderate chance, 3 = high chance   Sitting and Reading? Watching Television? Sitting inactive in a public place (theater or meeting)? As a passenger in a car for an hour without a break? Lying  down in the afternoon when circumstances permit? Sitting and talking to someone? Sitting quietly after lunch without alcohol? In a car, while stopped for a few minutes in traffic?   See above , new ankle edema.  Social History   Socioeconomic History   Marital status: Widowed    Spouse name: Not on file   Number of children: 2   Years of education: College   Highest education level: Not on file  Occupational History    Employer: SELF EMPLOYED   Occupation: retired  Tobacco Use   Smoking status: Never   Smokeless tobacco: Never  Vaping Use   Vaping status: Never Used  Substance and Sexual Activity   Alcohol use: Yes    Alcohol/week: 0.0 standard drinks of alcohol    Comment: 2 drinks/month   Drug use: No   Sexual activity: Not Currently    Birth control/protection: Post-menopausal    Comment: 1st intercourse 77 yo-Fewer than 5 partners  Other Topics Concern   Not on file  Social History Narrative   Patient is widowed and lives alone.   Patient has two adult children.   Patient is self-employed, semi-retired.   Patient is right-handed.  Patient drinks three cups of coffee daily.   Patient has some college education.   Social Determinants of Health   Financial Resource Strain: Not on file  Food Insecurity: Not on file  Transportation Needs: Not on file  Physical Activity: Not on file  Stress: Not on file  Social Connections: Not on file    Family History  Problem Relation Age of Onset   Diabetes Mother    Hypertension Mother    Heart disease Mother    Heart disease Father    Other Father        circulation problems   Cancer - Colon Child    Colon cancer Neg Hx    Liver disease Neg Hx    Esophageal cancer Neg Hx     Past Medical History:  Diagnosis Date   Arthritis    "wrist, shoulders; its just there "   Atrophic vaginitis    CIN I (cervical intraepithelial neoplasia I) 03/2004   LEEP   Heart murmur    dx in childohood; " i have a small hole in my  hart but it doesnt do anything"    History of kidney stones    "i had 1 kidney at age 34 and at that time they went up, put a basket and retrieved "    Hx of vertigo    "3 to 4 months of it; but currently no symptoms becasue i used the epley maneuver"    Hypercholesteremia    cant tolerate statins; just watching diet ; currently on weight watchers   Hypertension    Nasal turbinate hypertrophy    dx per Dr. Jenne Pane ; uses nasal spray for relief    OSA (obstructive sleep apnea)    initial dx in 2015 as mild; retest 07-09-17 dx with SEVERE OSA ; no current CPAP use, per Neuro Dr Richardean Chimera plan to complete CPAP titration (see 07-19-17 telephone note in epic)   Other malaise and fatigue 02/28/2014   Post-operative nausea and vomiting    Snoring 02/28/2014    Past Surgical History:  Procedure Laterality Date   ARTHROSCOPY OF KNEE Right    same practice as Dr Barron [facility] now [facility]    CERVICAL BIOPSY  W/ LOOP ELECTRODE EXCISION  2005   done with Dr Domingo Dimes ; [facility]. same practice as Dr Audie Box; reports : " i guess it was benign because we didnt have to do anything"    CYSTOSCOPY     DILATATION & CURETTAGE/HYSTEROSCOPY WITH MYOSURE N/A 08/25/2017   Procedure: DILATATION & CURETTAGE/HYSTEROSCOPY WITH MYOSURE;  Surgeon: Dara Lords, MD;  Location: [facility];  Service: Gynecology;  Laterality: N/A;  request to follow around 10:15am in [facility] block time. requests one hour OR time.   DRUG INDUCED ENDOSCOPY N/A 12/06/2020   Procedure: DRUG INDUCED ENDOSCOPY;  Surgeon: Christia Reading, MD;  Location: [facility] OR;  Service: ENT;  Laterality: N/A;   IMPLANTATION OF HYPOGLOSSAL NERVE STIMULATOR Right 09/30/2021   Procedure: IMPLANTATION OF HYPOGLOSSAL NERVE STIMULATOR;  Surgeon: Christia Reading, MD;  Location: [facility];  Service: ENT;  Laterality: Right;   NASAL TURBINATE REDUCTION Bilateral 12/06/2020   Procedure: TURBINATE REDUCTION/SUBMUCOSAL  RESECTION;  Surgeon: Christia Reading, MD;  Location: [facility] OR;  Service: ENT;  Laterality: Bilateral;   REPLACEMENT TOTAL KNEE Right 2009   dr Lequita Halt    sleep apnea device  12/2021     Current Outpatient Medications on File Prior to Visit  Medication  Sig Dispense Refill   acetaminophen (TYLENOL) 650 MG CR tablet Take 650 mg by mouth every 8 (eight) hours as needed for pain.     amLODipine (NORVASC) 10 MG tablet Take 10 mg by mouth daily.     Cholecalciferol (VITAMIN D) 50 MCG (2000 UT) CAPS Take 1 capsule Orally Once a day for 90 days     ibuprofen (ADVIL,MOTRIN) 200 MG tablet Take 400 mg by mouth every 6 (six) hours as needed for moderate pain (pain).     losartan-hydrochlorothiazide (HYZAAR) 100-12.5 MG tablet Take 1 tablet by mouth daily.     metoprolol tartrate (LOPRESSOR) 50 MG tablet Take 50 mg by mouth 2 (two) times daily.     minoxidil (LONITEN) 10 MG tablet Take 10 mg by mouth 2 (two) times daily.     zaleplon (SONATA) 5 MG capsule 1 capsule at bedtime as needed Orally Once a day for 30 days     No current facility-administered medications on file prior to visit.    Allergies  Allergen Reactions   Ezetimibe Other (See Comments)    Joint pain   Labetalol Hcl Other (See Comments)    Took way taste   Other Other (See Comments)   Pravastatin Other (See Comments)    Joint pain     DIAGNOSTIC DATA (LABS, IMAGING, TESTING) - I reviewed patient records, labs, notes, testing and imaging myself where available.  Lab Results  Component Value Date   WBC 6.2 12/06/2020   HGB 13.8 12/06/2020   HCT 42.2 12/06/2020   MCV 89.4 12/06/2020   PLT 336 12/06/2020      Component Value Date/Time   NA 139 09/24/2021 1231   K 4.4 09/24/2021 1231   CL 104 09/24/2021 1231   CO2 27 09/24/2021 1231   GLUCOSE 99 09/24/2021 1231   BUN 19 09/24/2021 1231   CREATININE 0.90 09/24/2021 1231   CALCIUM 9.8 09/24/2021 1231   PROT 7.5 08/12/2017 1100   ALBUMIN 4.1 08/12/2017 1100   AST 20  08/12/2017 1100   ALT 16 08/12/2017 1100   ALKPHOS 89 08/12/2017 1100   BILITOT 0.7 08/12/2017 1100   GFRNONAA >60 09/24/2021 1231   GFRAA >60 08/12/2017 1100   No results found for: "CHOL", "HDL", "LDLCALC", "LDLDIRECT", "TRIG", "CHOLHDL" No results found for: "HGBA1C" No results found for: "VITAMINB12" No results found for: "TSH"  PHYSICAL EXAM:  Today's Vitals   01/11/23 1118  Weight: 159 lb 6.4 oz (72.3 kg)  Height: 5\' 2"  (1.575 m)   Body mass index is 29.15 kg/m.   Wt Readings from Last 3 Encounters:  01/11/23 159 lb 6.4 oz (72.3 kg)  11/18/22 158 lb 12.8 oz (72 kg)  11/03/22 160 lb (72.6 kg)     Ht Readings from Last 3 Encounters:  01/11/23 5\' 2"  (1.575 m)  11/18/22 5\' 2"  (1.575 m)  11/03/22 5\' 2"  (1.575 m)      General:  The patient is awake, alert and appears not in acute distress. The patient is well groomed. Head: Normocephalic, atraumatic. Neck is supple.   Tongue protrusion at 1.7 V was noted.      ASSESSMENT AND PLAN 77 y.o. year old female  here with:     1) Very high AHI and hypoxia while HST was tested on Inspire device. This was the second HST . Now having ankle edema , too.  Still on 1.7 V.  We will now order an ONO for 3 days on INSPIRE- if insurance permits -  I plan to follow up either personally or through our NP within 2-3 months. If the ONO confirms low oxygen on INSPIRE, will need to follow up with in lab sleep testing for 02 titration.    I would like to thank Garlan Fillers, MD for allowing me to meet with and to take care of this pleasant patient.         Electronically signed by: Melvyn Novas, MD 01/11/2023 11:53 AM  [facility] Board certified by The [employer] of Sleep Medicine and Diplomate of the [employer] of Sleep Medicine. Board certified In Neurology through the ABPN, Fellow of the [employer] of Neurology.                                    ......................Marland Kitchen

## 2023-01-11 NOTE — Telephone Encounter (Signed)
Patient came in today to see Dr. Vickey Huger for a inspire visit.   There was no changes that were made right now to her inspire.

## 2023-01-11 NOTE — Patient Instructions (Signed)
Pulse Oximetry Pulse oximetry is a technology that measures the oxygen saturation level in the blood through the skin without the need for a blood sample. This may also be referred to as oxygen level. The device used to measure the oxygen level is called a pulse oximeter. This device also measures the heart rate (pulse). Pulse oximetry helps to assess: Current oxygen level, including low blood oxygen levels (hypoxemia). The need for or effectiveness of oxygen therapy or other treatments, including the need for more or less oxygen. Blood flow (circulation) to different parts of the body. Oxygen level during activity. What are the benefits? Benefits of pulse oximetry include: Not needing a blood sample to measure the oxygen level. The test does not hurt. Having the option to measure oxygen level continuously or as needed. An alarm to tell you when your oxygen levels are out of range if pulse oximetry is continuous. What are the risks? The risks associated with pulse oximetry are rare. However, there is a risk of skin sores if the sensor is left in the same spot for long periods of time. What happens during the test? Pulse oximetry is done using a pulse oximeter device with a light sensor attached. One side of the sensor passes a red beam of light through the skin, and the other side of the sensor measures the amount of light that is absorbed while it passes through. The sensor is connected to the pulse oximeter. The pulse oximeter uses the information from the sensor to calculate the percentage of blood cells carrying oxygen in the blood. The sensor is placed on an area of the body where the beam of light can easily pass through the skin. For adults and children, the sensor is usually a clip placed on a finger, with the light centered over the nail bed. The sensor may also be placed on an earlobe or toe. For babies, the sensor is usually a sticky tape strip that is placed around areas such as the  sole of a foot or the palm of a hand. What can I expect after the test? The pulse oximetry results should be available right away. If your pulse oximetry results are low, you may need to use oxygen. The pulse oximetry results are a percentage. The normal value may vary depending on your medical condition. Most healthy people have oxygen saturation levels between 95% and 100%. Low oxygen saturation levels are below 90%. This may happen in people with lung conditions, such as long-term (chronic) obstructive pulmonary disease (COPD). What can affect the accuracy of the oximetry reading? Pulse oximetry depends on the amount of light absorbed as it passes through skin tissue. Because of this, the accuracy of this measurement can be affected by one or more of the following: Factors such as: Dark nail polish or artificial nails. Very dark skin. Shivering or too much movement. Bright, artificial lighting. Chronic smoking and recent breathing-in (inhalation) of smoke or carbon monoxide. Conditions such as: Cool skin or poor blood flow to the area where the sensor is placed. Sweating or very warm skin in the area where the sensor is placed. Anemia, or low levels of hemoglobin or red blood cells. Polycythemia vera. This is a bone marrow disease that causes high levels of red blood cells, white blood cells, and platelets. If a more accurate measurement is needed, a blood sample will be taken. Summary Pulse oximetry uses a device to measure the oxygen level in the blood. Pulse oximetry does not hurt. The  risks associated with pulse oximetry are rare. Most healthy people have oxygen levels between 95% and 100%. A low oxygen saturation level is below 90%. People with low oxygen levels may need supplemental oxygen. This information is not intended to replace advice given to you by your health care provider. Make sure you discuss any questions you have with your health care provider. Document Revised:  02/05/2021 Document Reviewed: 08/27/2020 Elsevier Patient Education  2024 [employer].

## 2023-02-01 DIAGNOSIS — H52203 Unspecified astigmatism, bilateral: Secondary | ICD-10-CM | POA: Diagnosis not present

## 2023-02-01 DIAGNOSIS — H40053 Ocular hypertension, bilateral: Secondary | ICD-10-CM | POA: Diagnosis not present

## 2023-02-01 DIAGNOSIS — H25012 Cortical age-related cataract, left eye: Secondary | ICD-10-CM | POA: Diagnosis not present

## 2023-02-01 DIAGNOSIS — H2513 Age-related nuclear cataract, bilateral: Secondary | ICD-10-CM | POA: Diagnosis not present

## 2023-02-19 DIAGNOSIS — G4733 Obstructive sleep apnea (adult) (pediatric): Secondary | ICD-10-CM | POA: Diagnosis not present

## 2023-02-23 ENCOUNTER — Telehealth: Payer: Self-pay | Admitting: Anesthesiology

## 2023-02-23 NOTE — Telephone Encounter (Signed)
Pulse Oximetry Report

## 2023-02-25 ENCOUNTER — Encounter: Payer: Self-pay | Admitting: Neurology

## 2023-02-25 DIAGNOSIS — R0902 Hypoxemia: Secondary | ICD-10-CM

## 2023-02-25 DIAGNOSIS — G4733 Obstructive sleep apnea (adult) (pediatric): Secondary | ICD-10-CM

## 2023-02-25 NOTE — Telephone Encounter (Signed)
Dr Dohmeier reviewed the data from the ONO while using inspire report completed 9/12-9/13. There was a total recording of 8 hours 31 min and 48 seconds. Out of that time there was a total of 55 min and 46 seconds where the oxygen was below 88%. The lowest the oxygen level dropped was 80%. Given the patient is using inspire and still having problems with low oxygen, she would benefit with referral to pulmonology to evaluate the low oxygen concerns and see if oxygen is necessary to wear at bedtime.

## 2023-03-17 DIAGNOSIS — L821 Other seborrheic keratosis: Secondary | ICD-10-CM | POA: Diagnosis not present

## 2023-03-17 DIAGNOSIS — D224 Melanocytic nevi of scalp and neck: Secondary | ICD-10-CM | POA: Diagnosis not present

## 2023-03-17 DIAGNOSIS — Z85828 Personal history of other malignant neoplasm of skin: Secondary | ICD-10-CM | POA: Diagnosis not present

## 2023-03-17 DIAGNOSIS — L570 Actinic keratosis: Secondary | ICD-10-CM | POA: Diagnosis not present

## 2023-03-17 DIAGNOSIS — D2271 Melanocytic nevi of right lower limb, including hip: Secondary | ICD-10-CM | POA: Diagnosis not present

## 2023-03-17 DIAGNOSIS — D1801 Hemangioma of skin and subcutaneous tissue: Secondary | ICD-10-CM | POA: Diagnosis not present

## 2023-03-17 DIAGNOSIS — D225 Melanocytic nevi of trunk: Secondary | ICD-10-CM | POA: Diagnosis not present

## 2023-03-17 DIAGNOSIS — L814 Other melanin hyperpigmentation: Secondary | ICD-10-CM | POA: Diagnosis not present

## 2023-04-22 DIAGNOSIS — Z1231 Encounter for screening mammogram for malignant neoplasm of breast: Secondary | ICD-10-CM | POA: Diagnosis not present

## 2023-05-20 ENCOUNTER — Ambulatory Visit (HOSPITAL_BASED_OUTPATIENT_CLINIC_OR_DEPARTMENT_OTHER): Payer: Medicare HMO | Admitting: Pulmonary Disease

## 2023-05-20 ENCOUNTER — Encounter (HOSPITAL_BASED_OUTPATIENT_CLINIC_OR_DEPARTMENT_OTHER): Payer: Self-pay | Admitting: Pulmonary Disease

## 2023-05-20 VITALS — BP 128/68 | HR 70 | Resp 16 | Ht 62.0 in | Wt 162.0 lb

## 2023-05-20 DIAGNOSIS — Z23 Encounter for immunization: Secondary | ICD-10-CM | POA: Diagnosis not present

## 2023-05-20 DIAGNOSIS — G4734 Idiopathic sleep related nonobstructive alveolar hypoventilation: Secondary | ICD-10-CM | POA: Diagnosis not present

## 2023-05-20 DIAGNOSIS — G4733 Obstructive sleep apnea (adult) (pediatric): Secondary | ICD-10-CM | POA: Diagnosis not present

## 2023-05-20 NOTE — Patient Instructions (Signed)
We will have you come back so we can adjust settings on your device , probably an electrode change , to optimise your device

## 2023-05-20 NOTE — Assessment & Plan Note (Addendum)
She was CPAP intolerant and has hypoglossal nerve stimulator device implanted.  This has been titrated to 1.7 V.  Unfortunately she still has residual events so I feel that titration is suboptimal.  She has nocturnal hypoxia but this is likely due to residual events.  She is a never smoker and I doubt that she has underlying pulmonary disease Tongue does not seem to be overstimulated.  I reviewed her inspire titration study, AHI seems to be decreasing on increasing the voltage but unfortunately she was not started on 1.7 V since there was no sleep at this level.  It is likely that she will need change to an electrode configuration because she is not able to tolerate voltage about 1.7 V.  She reports that even at this level if she wakes up it takes her about 30 minutes to fall back asleep and she has to use the pause button to avoid stimulation Would only consider providing her oxygen therapy if we are unable to achieve satisfactory control of AHI with her device  We will bring her back in for a follow-up visit and proceed with change of electrode configuration/alternate inspire pathway

## 2023-05-20 NOTE — Progress Notes (Signed)
Subjective:    Patient ID: Jessica Gibson, female    DOB: 22-Nov-1945, 77 y.o.   MRN: [id number]  HPI  77 year old woman with hypoglossal nerve stimulator device for OSA presents for evaluation of nocturnal hypoxia. She was intolerant of CPAP and underwent implantation of HNS on 10/14/2021. She underwent activation 11/20/2021 and was titrated to 1.7 V. Delay 60 minutes, pause 30 minutes, duration 8 hours She reports insomnia for many years -it takes me a long time to go to sleep and stay asleep. She is asymptomatic except for pedal edema and a swimming sensation when she turns her head suddenly. Epworth Sleepiness Scale is 5 Bedtime is between 11 PM and midnight sleep latency is 30 minutes to an hour, she reports 2 nocturnal awakenings and is out of bed at 7 AM feeling rested without dryness of mouth or headaches.   I have reviewed her serial sleep studies which are as follows :  NPSG 04/2021 [baseline] AHI 19/hour worse during REM sleep, supine sleep only 55 minutes  02/2022 inspire titration -no sleep achieved on 1.7 V, AHI 21/hour and 1.6 V, AHI 58/hour on 1.5 V  10/2022 WatchPAT HST AHI 19/hour, 23 minutes saturation less than 89%.  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 Jenne Pane ;  uses nasal spray for relief    OSA (obstructive sleep apnea)    initial dx in 2015 as mild; retest 07-09-17 dx with SEVERE OSA ; no current CPAP use, per Neuro Dr Richardean Chimera plan to complete CPAP titration (see 07-19-17 telephone note in epic)   Other malaise and fatigue 02/28/2014   Post-operative nausea and vomiting    Snoring 02/28/2014    Past Surgical History:  Procedure Laterality Date   ARTHROSCOPY OF KNEE Right    same practice as Dr Barron Schmid [facility] now [facility]    CERVICAL BIOPSY  W/ LOOP ELECTRODE EXCISION  2005   done with Dr Domingo Dimes ; [facility]. same practice as Dr Audie Box; reports : " i guess it was benign because we didnt have to do anything"    CYSTOSCOPY     DILATATION & CURETTAGE/HYSTEROSCOPY WITH MYOSURE N/A 08/25/2017   Procedure: DILATATION & CURETTAGE/HYSTEROSCOPY WITH MYOSURE;  Surgeon: Dara Lords, MD;  Location: [facility];  Service: Gynecology;  Laterality: N/A;  request to follow around 10:15am in [facility] block time. requests one hour OR time.   DRUG INDUCED ENDOSCOPY N/A 12/06/2020   Procedure: DRUG INDUCED ENDOSCOPY;  Surgeon: Christia Reading, MD;  Location: [facility] OR;  Service: ENT;  Laterality: N/A;  IMPLANTATION OF HYPOGLOSSAL NERVE STIMULATOR Right 09/30/2021   Procedure: IMPLANTATION OF HYPOGLOSSAL NERVE STIMULATOR;  Surgeon: Christia Reading, MD;  Location: [facility];  Service: ENT;  Laterality: Right;   NASAL TURBINATE REDUCTION Bilateral 12/06/2020   Procedure: TURBINATE REDUCTION/SUBMUCOSAL RESECTION;  Surgeon: Christia Reading, MD;  Location: [facility] OR;  Service: ENT;  Laterality: Bilateral;   REPLACEMENT TOTAL KNEE Right 2009   dr Lequita Halt    sleep apnea device  12/2021    Allergies  Allergen Reactions   Ezetimibe Other (See Comments)    Joint pain   Labetalol Hcl Other (See Comments)    Took way taste   Other Other (See Comments)   Pravastatin Other (See Comments)    Joint pain    Social History    Socioeconomic History   Marital status: Widowed    Spouse name: Not on file   Number of children: 2   Years of education: College   Highest education level: Not on file  Occupational History    Employer: SELF EMPLOYED   Occupation: retired  Tobacco Use   Smoking status: Never   Smokeless tobacco: Never  Vaping Use   Vaping status: Never Used  Substance and Sexual Activity   Alcohol use: Yes    Alcohol/week: 0.0 standard drinks of alcohol    Comment: 2 drinks/month   Drug use: No   Sexual activity: Not Currently    Birth control/protection: Post-menopausal    Comment: 1st intercourse 77 yo-Fewer than 5 partners  Other Topics Concern   Not on file  Social History Narrative   Patient is widowed and lives alone.   Patient has two adult children.   Patient is self-employed, semi-retired.   Patient is right-handed.   Patient drinks three cups of coffee daily.   Patient has some college education.   Social Drivers of Corporate investment banker Strain: Not on file  Food Insecurity: Not on file  Transportation Needs: Not on file  Physical Activity: Not on file  Stress: Not on file  Social Connections: Not on file  Intimate Partner Violence: Not on file    Family History  Problem Relation Age of Onset   Diabetes Mother    Hypertension Mother    Heart disease Mother    Heart disease Father    Other Father        circulation problems   Cancer - Colon Child    Colon cancer Neg Hx    Liver disease Neg Hx    Esophageal cancer Neg Hx      Review of Systems Constitutional: negative for anorexia, fevers and sweats  Eyes: negative for irritation, redness and visual disturbance  Ears, nose, mouth, throat, and face: negative for earaches, epistaxis, nasal congestion and sore throat  Respiratory: negative for cough, dyspnea on exertion, sputum and wheezing  Cardiovascular: negative for chest pain, dyspnea, lower extremity edema, orthopnea, palpitations and syncope   Gastrointestinal: negative for abdominal pain, constipation, diarrhea, melena, nausea and vomiting  Genitourinary:negative for dysuria, frequency and hematuria  Hematologic/lymphatic: negative for bleeding, easy bruising and lymphadenopathy  Musculoskeletal:negative for arthralgias, muscle weakness and stiff joints  Neurological: negative for coordination problems, gait problems, headaches and weakness  Endocrine: negative for diabetic symptoms including polydipsia, polyuria and weight loss     Objective:   Physical Exam  Gen. Pleasant, obese, in no distress, normal affect ENT - no pallor,icterus, no post nasal drip, class 2 Mallampati, healed incisions Neck: No JVD, no thyromegaly, no carotid  bruits Lungs: no use of accessory muscles, no dullness to percussion, decreased without rales or rhonchi  Cardiovascular: Rhythm regular, heart sounds  normal, no murmurs or gallops, no peripheral edema Abdomen: soft and non-tender, no hepatosplenomegaly, BS normal. Musculoskeletal: No deformities, no cyanosis or clubbing Neuro:  alert, non focal, no tremors       Assessment & Plan:

## 2023-05-24 ENCOUNTER — Ambulatory Visit (HOSPITAL_BASED_OUTPATIENT_CLINIC_OR_DEPARTMENT_OTHER): Payer: Medicare HMO | Admitting: Pulmonary Disease

## 2023-05-24 ENCOUNTER — Encounter (HOSPITAL_BASED_OUTPATIENT_CLINIC_OR_DEPARTMENT_OTHER): Payer: Self-pay | Admitting: Pulmonary Disease

## 2023-05-24 DIAGNOSIS — G4733 Obstructive sleep apnea (adult) (pediatric): Secondary | ICD-10-CM

## 2023-05-24 NOTE — Progress Notes (Signed)
   Subjective:    Patient ID: Jessica Gibson, female    DOB: 03/30/1946, 77 y.o.   MRN: [id number]  HPI  77 year old woman with hypoglossal nerve stimulator device for OSA for FU of  nocturnal hypoxia. She was intolerant of CPAP and underwent implantation of HNS on 10/14/2021. She underwent activation 11/20/2021 and was titrated to 1.7 V. Delay 60 minutes, pause 30 minutes, duration 8 hours  No bed partner history is available. She reports feeling rested on waking up compliance report was reviewed which shows 86% nights used and 73% usage more than 4 hours. She is set at 1.7 V Previous sleep studies were reviewed. Inspire rep present  Significant tests/ events reviewed  NPSG 04/2021 [baseline] AHI 19/hour worse during REM sleep, supine sleep only 55 minutes   02/2022 inspire titration -no sleep achieved on 1.7 V, AHI 21/hour on 1.6 V, AHI 58/hour on 1.5 V   10/2022 WatchPAT HST AHI 19/hour, 23 minutes saturation less than 89%.   12/2022 WatchPAT HST pAHI 14.6/hour, on left side 9.2/hour, 86 minutes saturation less than 89%  Review of Systems neg for any significant sore throat, dysphagia, itching, sneezing, nasal congestion or excess/ purulent secretions, fever, chills, sweats, unintended wt loss, pleuritic or exertional cp, hempoptysis, orthopnea pnd or change in chronic leg swelling. Also denies presyncope, palpitations, heartburn, abdominal pain, nausea, vomiting, diarrhea or change in bowel or urinary habits, dysuria,hematuria, rash, arthralgias, visual complaints, headache, numbness weakness or ataxia.     Objective:   Physical Exam  Gen. Pleasant, obese, in no distress ENT - no lesions, no post nasal drip, good tongue protrusion voltages from 1.5 to 1.8 V Neck: No JVD, no thyromegaly, no carotid bruits Lungs: no use of accessory muscles, no dullness to percussion, decreased without rales or rhonchi  Cardiovascular: Rhythm regular, heart sounds  normal, no murmurs or gallops, no  peripheral edema Musculoskeletal: No deformities, no cyanosis or clubbing , no tremors       Assessment & Plan:

## 2023-05-24 NOTE — Patient Instructions (Signed)
We increased voltage to 1.8 V, level 4  C at this level for 4 weeks and we will perform home sleep test

## 2023-05-24 NOTE — Assessment & Plan Note (Signed)
We increased her voltage to 1.8 V/level 4.  Will see how she tolerates this level and repeat home sleep test at this level and see if we can drop the AHI any further and also assess whether hypoxia persists. If she has persistent events, then she will likely need an alternate pathway with elected to change

## 2023-05-24 NOTE — Assessment & Plan Note (Signed)
I do feel that nocturnal hypoxia is related to suboptimal correction of OSA inspire device

## 2023-07-22 ENCOUNTER — Encounter (HOSPITAL_BASED_OUTPATIENT_CLINIC_OR_DEPARTMENT_OTHER): Payer: Self-pay | Admitting: Pulmonary Disease

## 2023-07-22 ENCOUNTER — Ambulatory Visit (HOSPITAL_BASED_OUTPATIENT_CLINIC_OR_DEPARTMENT_OTHER): Payer: Medicare HMO | Admitting: Pulmonary Disease

## 2023-07-22 VITALS — BP 128/64 | HR 78 | Ht 62.0 in | Wt 163.1 lb

## 2023-07-22 DIAGNOSIS — G4733 Obstructive sleep apnea (adult) (pediatric): Secondary | ICD-10-CM

## 2023-07-22 NOTE — Patient Instructions (Signed)
You are at level 2= 1.3 V  Increase to level 3 in 1 week  We will perform home sleep test x 2 nights First night = level 3 Second night = level 2

## 2023-07-22 NOTE — Progress Notes (Signed)
   Subjective:    Patient ID: Jessica Gibson, female    DOB: [birth year], 78 y.o.   MRN: [id number]  HPI  78 year old woman with hypoglossal nerve stimulator device for OSA for FU of  nocturnal hypoxia. She was intolerant of CPAP and underwent implantation of HNS on 10/14/2021. She underwent activation 11/20/2021 and was titrated to 1.7 V. Delay 60 minutes, pause 30 minutes, duration 8 hours    05/24/23 Incoming 1.7 V >> increased to 1.8 V  She did not tolerate 1.8 V due to soreness in her mouth and TMJ pain.  She decreased to 1.7 V and has been at this amplitude over the last 2 months. Download was reviewed which shows 93% usage and 87% usage more than 4 hours. Current range 1.5 to 2.0 V she feels well rested most nights    Significant tests/ events reviewed   NPSG 04/2021 [baseline] AHI 19/hour worse during REM sleep, supine sleep only 55 minutes   02/2022 inspire titration -no sleep achieved on 1.7 V, AHI 21/hour on 1.6 V with 53 min desat, AHI 58/hour on 1.5 V with 45 min desat   10/2022 WatchPAT HST AHI 19/hour, 23 minutes saturation less than 89%.   12/2022 WatchPAT HST pAHI 14.6/hour, on left side 9.2/hour, 86 minutes saturation less than 89%  Review of Systems neg for any significant sore throat, dysphagia, itching, sneezing, nasal congestion or excess/ purulent secretions, fever, chills, sweats, unintended wt loss, pleuritic or exertional cp, hempoptysis, orthopnea pnd or change in chronic leg swelling. Also denies presyncope, palpitations, heartburn, abdominal pain, nausea, vomiting, diarrhea or change in bowel or urinary habits, dysuria,hematuria, rash, arthralgias, visual complaints, headache, numbness weakness or ataxia.     Objective:   Physical Exam  Gen. Pleasant, obese, in no distress ENT - no lesions, no post nasal drip Neck: No JVD, no thyromegaly, no carotid bruits Lungs: no use of accessory muscles, no dullness to percussion, decreased without rales or rhonchi   Cardiovascular: Rhythm regular, heart sounds  normal, no murmurs or gallops, no peripheral edema Musculoskeletal: No deformities, no cyanosis or clubbing , no tremors       Assessment & Plan:    Hypoglossal nerve stimulator was reassessed today.  Goal of the follow-up visit was to ensure good compliance, good subjective benefit, good tongue motion and good sense lead waveforms ,tongue protrusion was examined.    Programming :  1.  Ongoing amplitude was 1.3 V in the drop range from 1.2-1.6 V.  We felt that she was hyperstimulated on 1.7 V today 2. at level 2= 1.3 V  Increase to level 3 in 1 week  We will perform home sleep test x 2 nights First night = level 3 Second night = level 2 4.  Sleep remote education was provided patient demonstrated competency with the remote and was aware of patient Instruction videos and sleep remote guide

## 2023-08-04 ENCOUNTER — Encounter

## 2023-08-04 DIAGNOSIS — G4730 Sleep apnea, unspecified: Secondary | ICD-10-CM | POA: Diagnosis not present

## 2023-08-04 DIAGNOSIS — G4733 Obstructive sleep apnea (adult) (pediatric): Secondary | ICD-10-CM

## 2023-08-16 ENCOUNTER — Telehealth: Payer: Self-pay | Admitting: Pulmonary Disease

## 2023-08-16 DIAGNOSIS — R069 Unspecified abnormalities of breathing: Secondary | ICD-10-CM | POA: Diagnosis not present

## 2023-08-16 NOTE — Telephone Encounter (Signed)
 She had residual AHI of 27/h & low sat of 81% on HST Confirm that inspire device was on night of study & set at level 4 as we had discussed /1.4 Colin Broach, please provide download Please schedule FU OV on inspire day -next month

## 2023-08-20 DIAGNOSIS — H6121 Impacted cerumen, right ear: Secondary | ICD-10-CM | POA: Diagnosis not present

## 2023-08-20 DIAGNOSIS — J309 Allergic rhinitis, unspecified: Secondary | ICD-10-CM | POA: Diagnosis not present

## 2023-08-20 DIAGNOSIS — H938X2 Other specified disorders of left ear: Secondary | ICD-10-CM | POA: Diagnosis not present

## 2023-09-17 ENCOUNTER — Encounter (HOSPITAL_BASED_OUTPATIENT_CLINIC_OR_DEPARTMENT_OTHER): Payer: Self-pay | Admitting: Pulmonary Disease

## 2023-09-17 ENCOUNTER — Ambulatory Visit (HOSPITAL_BASED_OUTPATIENT_CLINIC_OR_DEPARTMENT_OTHER): Admitting: Pulmonary Disease

## 2023-09-17 VITALS — BP 122/74 | HR 68 | Ht 62.0 in | Wt 163.9 lb

## 2023-09-17 DIAGNOSIS — Z9682 Presence of neurostimulator: Secondary | ICD-10-CM | POA: Diagnosis not present

## 2023-09-17 DIAGNOSIS — G4733 Obstructive sleep apnea (adult) (pediatric): Secondary | ICD-10-CM | POA: Diagnosis not present

## 2023-09-17 NOTE — Patient Instructions (Addendum)
 We increased to level 4 =1.5 V  Try to stay at this level, If needed use the pause button  If too strong, then ok to go down to  level3   X Home sleep tst (Alice) at this level

## 2023-09-17 NOTE — Progress Notes (Signed)
   Subjective:    Patient ID: Jessica Gibson, female    DOB: 16-Dec-1945, 78 y.o.   MRN: [id number]  HPI  78 yo  woman with hypoglossal nerve stimulator device for OSA for FU of  nocturnal hypoxia. She was intolerant of CPAP and underwent implantation of HNS on 10/14/2021. She underwent activation 11/20/2021 and was titrated to 1.7 V. Delay 60 minutes, pause 30 minutes, duration 8 hours     05/24/23 Incoming 1.7 V >> increased to 1.8 V   She did not tolerate 1.8 V due to soreness in her mouth and TMJ pain.  She decreased to 1.7 V  07/22/23  We felt that she was hyperstimulated on 1.7 V  Outgoing amplitude was 1.3 V , dropped range from 1.2-1.6 V.   96-month follow-up visit. She has been able to use her device every night.  She is troubled by insomnia.  She occasionally uses Sonata, mostly uses Tylenol since she reports insomnia is related to her aches and pains. We reviewed sleep study.  She obtained a new phone so we do not have a download beyond end March She feels that the stimulation level is comfortable  Download shows that she is using 90% of the nights average usage 6.5 hours, pause button usage is acceptable  Significant tests/ events reviewed   HST SNAP 08/04/23 AHI 27/h    NPSG 04/2021 [baseline] AHI 19/hour worse during REM sleep, supine sleep only 55 minutes   02/2022 inspire titration -no sleep achieved on 1.7 V, AHI 21/hour on 1.6 V with 53 min desat, AHI 58/hour on 1.5 V with 45 min desat   10/2022 WatchPAT HST AHI 19/hour, 23 minutes saturation less than 89%.   12/2022 WatchPAT HST pAHI 14.6/hour, on left side 9.2/hour, 86 minutes saturation less than 89%  Review of Systems neg for any significant sore throat, dysphagia, itching, sneezing, nasal congestion or excess/ purulent secretions, fever, chills, sweats, unintended wt loss, pleuritic or exertional cp, hempoptysis, orthopnea pnd or change in chronic leg swelling. Also denies presyncope, palpitations, heartburn,  abdominal pain, nausea, vomiting, diarrhea or change in bowel or urinary habits, dysuria,hematuria, rash, arthralgias, visual complaints, headache, numbness weakness or ataxia.     Objective:   Physical Exam  Gen. Pleasant, obese, in no distress ENT - no lesions, no post nasal drip Neck: No JVD, no thyromegaly, no carotid bruits Lungs: no use of accessory muscles, no dullness to percussion, decreased without rales or rhonchi  Cardiovascular: Rhythm regular, heart sounds  normal, no murmurs or gallops, no peripheral edema Musculoskeletal: No deformities, no cyanosis or clubbing , no tremors   Tongue stimulation was good when starting the treatment at levels 1.4 and 1.5 V    Assessment & Plan:    OSA Status postplacement of hypoglossal nerve stimulator   - She still has significant residual AHI of 27/hour when she was studied although she had a 2 night study only 1 night data was available because the first night on 1.4 V she had significant insomnia, on the second night 1.3 V she had residual 27/hour  We increased to level 4 =1.5 V  Try to stay at this level, If needed use the pause button.  Ideally I would like to study however at this level  If too strong, then ok to go down to  level3   X Home sleep test [facility]) at this level

## 2023-10-15 ENCOUNTER — Ambulatory Visit (HOSPITAL_BASED_OUTPATIENT_CLINIC_OR_DEPARTMENT_OTHER)

## 2023-10-15 DIAGNOSIS — G4733 Obstructive sleep apnea (adult) (pediatric): Secondary | ICD-10-CM

## 2023-10-21 ENCOUNTER — Telehealth: Payer: Self-pay | Admitting: Pulmonary Disease

## 2023-10-21 DIAGNOSIS — G4733 Obstructive sleep apnea (adult) (pediatric): Secondary | ICD-10-CM | POA: Diagnosis not present

## 2023-10-21 NOTE — Telephone Encounter (Signed)
 She had residual AHI of 22/h on inspire settting of 1.5V She will need further uptitration vs alternate electrode testing  Please schedule OV with me on inspire ay with rep present

## 2023-10-25 NOTE — Telephone Encounter (Signed)
 Patient is rescheduled for 5/22 w/RA @[facility]  at 11:45

## 2023-10-28 ENCOUNTER — Encounter (HOSPITAL_BASED_OUTPATIENT_CLINIC_OR_DEPARTMENT_OTHER): Payer: Self-pay | Admitting: Pulmonary Disease

## 2023-10-28 ENCOUNTER — Ambulatory Visit (HOSPITAL_BASED_OUTPATIENT_CLINIC_OR_DEPARTMENT_OTHER): Admitting: Pulmonary Disease

## 2023-10-28 ENCOUNTER — Encounter: Payer: Self-pay | Admitting: Pulmonary Disease

## 2023-10-28 VITALS — BP 139/58 | HR 65 | Ht 62.0 in | Wt 159.8 lb

## 2023-10-28 DIAGNOSIS — G4733 Obstructive sleep apnea (adult) (pediatric): Secondary | ICD-10-CM | POA: Diagnosis not present

## 2023-10-28 DIAGNOSIS — Z9682 Presence of neurostimulator: Secondary | ICD-10-CM | POA: Diagnosis not present

## 2023-10-28 NOTE — Patient Instructions (Signed)
 VISIT SUMMARY:  Today, we evaluated the settings of your sleep apnea device to improve its effectiveness and comfort. We tested different electrodes and voltages to find the best settings for you.  YOUR PLAN:  -SLEEP APNEA: Sleep apnea is a condition where your breathing repeatedly stops and starts during sleep. We found that your current device settings were not fully effective, so we tested different electrodes and voltages. We will adjust your device to use electrode B starting at 0.6 volts, with the option to increase to 0.7 and 0.8 volts over two-week intervals. Monitor how you feel with these settings, and if you experience any discomfort, you can back down 1 level We will follow up in six weeks to see how you are doing and may consider a home sleep test if there are improvements.

## 2023-10-28 NOTE — Progress Notes (Signed)
 Subjective:    Patient ID: Jessica Gibson, female    DOB: 07/31/1945, 78 y.o.   MRN: [id number]  HPI  78 yo  woman with hypoglossal nerve stimulator device for OSA for FU of  nocturnal hypoxia. She was intolerant of CPAP and underwent implantation of HNS on 10/14/2021. She underwent activation 11/20/2021 and was titrated to 1.7 V. Delay 60 minutes, pause 30 minutes, duration 8 hours     05/24/23 Incoming 1.7 V >> increased to 1.8 V   She did not tolerate 1.8 V due to soreness in her mouth and TMJ pain.  She decreased to 1.7 V   07/22/23  We felt that she was hyperstimulated on 1.7 V  Outgoing amplitude was 1.3 V , dropped range from 1.2-1.6 V.   6 wk FU  She uses her sleep apnea device almost nightly, with occasional missed nights, and does not frequently pause it. At her current setting of 1.5 V, she experiences no discomfort and is unaware of its activation. Initial titration to 1.7 volts caused hyperstimulation, and 1.8 volts led to oral soreness. Voltages of 1.3, 1.4, and 1.5 volts were ineffective, with a residual AHI of 22 events per hour.  During the session, electrode B was tested, and she tolerated up to 0.8 volts, finding 0.5 volts functional. The sensation was stronger, felt primarily in her tongue, without airway restriction. Electrode C produced a sensation towards the front of her tongue, which was comfortable and similar to her current level.  She has undergone nasal surgery but continues to use a spray for persistent nasal breathing difficulties. She is sensitive to airway closure when lying down but not when sitting up.  Procedure: Electrode Configuration Adjustment Description: Advanced electrode configurations in electrode B and C were checked. On B, she tolerated up to 0.8 volts and was functional at 0.5 volts. Electrode C was somewhat comparable, but electrode B configuration was chosen for initial trial.   Significant tests/ events reviewed    HST 10/2023 She had  residual AHI of 22/h on inspire settting of 1.5V    HST SNAP 08/04/23 AHI 27/h -She still has significant residual AHI of 27/hour when she was studied although she had a 2 night study only 1 night data was available because the first night on 1.4 V she had significant insomnia, on the second night 1.3 V she had residual 27/hour      NPSG 04/2021 [baseline] AHI 19/hour worse during REM sleep, supine sleep only 55 minutes   02/2022 inspire titration -no sleep achieved on 1.7 V, AHI 21/hour on 1.6 V with 53 min desat, AHI 58/hour on 1.5 V with 45 min desat   10/2022 WatchPAT HST AHI 19/hour, 23 minutes saturation less than 89%.   12/2022 WatchPAT HST pAHI 14.6/hour, on left side 9.2/hour, 86 minutes saturation less than 89%  Review of Systems neg for any significant sore throat, dysphagia, itching, sneezing, nasal congestion or excess/ purulent secretions, fever, chills, sweats, unintended wt loss, pleuritic or exertional cp, hempoptysis, orthopnea pnd or change in chronic leg swelling. Also denies presyncope, palpitations, heartburn, abdominal pain, nausea, vomiting, diarrhea or change in bowel or urinary habits, dysuria,hematuria, rash, arthralgias, visual complaints, headache, numbness weakness or ataxia.     Objective:   Physical Exam  Gen. Pleasant, obese, in no distress ENT - no lesions, no post nasal drip Neck: No JVD, no thyromegaly, no carotid bruits Lungs: no use of accessory muscles, no dullness to percussion, decreased without rales or rhonchi  Cardiovascular: Rhythm regular, heart sounds  normal, no murmurs or gallops, no peripheral edema Musculoskeletal: No deformities, no cyanosis or clubbing , no tremors       Assessment & Plan:    OSA s/p HNS Sleep apnea with residual events of 20 per hour, indicating suboptimal control. Current therapy involves a hypoglossal nerve stimulator to prevent airway obstruction. Previous settings on electrode A were ineffective, and higher  voltages caused discomfort. Electrode B was tested and tolerated up to 0.8 volts, with functional results at 0.5 volts. Electrode C was comparable but less effective. - Adjust device settings to electrode B starting at 0.6 volts, with the option to increase to 0.7 and 0.8 volts over two-week intervals. - Monitor comfort and effectiveness of electrode B settings. - If electrode B is not tolerated, consider reverting to electrode C settings. - Schedule follow-up in six weeks to assess progress and consider a home sleep test if improvements are noted.

## 2023-11-18 DIAGNOSIS — S30861A Insect bite (nonvenomous) of abdominal wall, initial encounter: Secondary | ICD-10-CM | POA: Diagnosis not present

## 2023-11-25 DIAGNOSIS — E785 Hyperlipidemia, unspecified: Secondary | ICD-10-CM | POA: Diagnosis not present

## 2023-11-25 DIAGNOSIS — I10 Essential (primary) hypertension: Secondary | ICD-10-CM | POA: Diagnosis not present

## 2023-11-25 DIAGNOSIS — E559 Vitamin D deficiency, unspecified: Secondary | ICD-10-CM | POA: Diagnosis not present

## 2023-12-02 DIAGNOSIS — Z0000 Encounter for general adult medical examination without abnormal findings: Secondary | ICD-10-CM | POA: Diagnosis not present

## 2023-12-02 DIAGNOSIS — Z1339 Encounter for screening examination for other mental health and behavioral disorders: Secondary | ICD-10-CM | POA: Diagnosis not present

## 2023-12-02 DIAGNOSIS — Z9682 Presence of neurostimulator: Secondary | ICD-10-CM | POA: Diagnosis not present

## 2023-12-02 DIAGNOSIS — Z1331 Encounter for screening for depression: Secondary | ICD-10-CM | POA: Diagnosis not present

## 2023-12-02 DIAGNOSIS — G4733 Obstructive sleep apnea (adult) (pediatric): Secondary | ICD-10-CM | POA: Diagnosis not present

## 2023-12-02 DIAGNOSIS — E785 Hyperlipidemia, unspecified: Secondary | ICD-10-CM | POA: Diagnosis not present

## 2023-12-02 DIAGNOSIS — R82998 Other abnormal findings in urine: Secondary | ICD-10-CM | POA: Diagnosis not present

## 2023-12-02 DIAGNOSIS — G720 Drug-induced myopathy: Secondary | ICD-10-CM | POA: Diagnosis not present

## 2023-12-02 DIAGNOSIS — E559 Vitamin D deficiency, unspecified: Secondary | ICD-10-CM | POA: Diagnosis not present

## 2023-12-02 DIAGNOSIS — I10 Essential (primary) hypertension: Secondary | ICD-10-CM | POA: Diagnosis not present

## 2023-12-17 IMAGING — CR DG NECK SOFT TISSUE
2 series · 2 of 2 positions shown · non-contrast
Comparison: None.

CLINICAL DATA: Hypoglossal nerve stimulator placement

EXAM:
NECK SOFT TISSUES - 1+ VIEW

[neck lat]
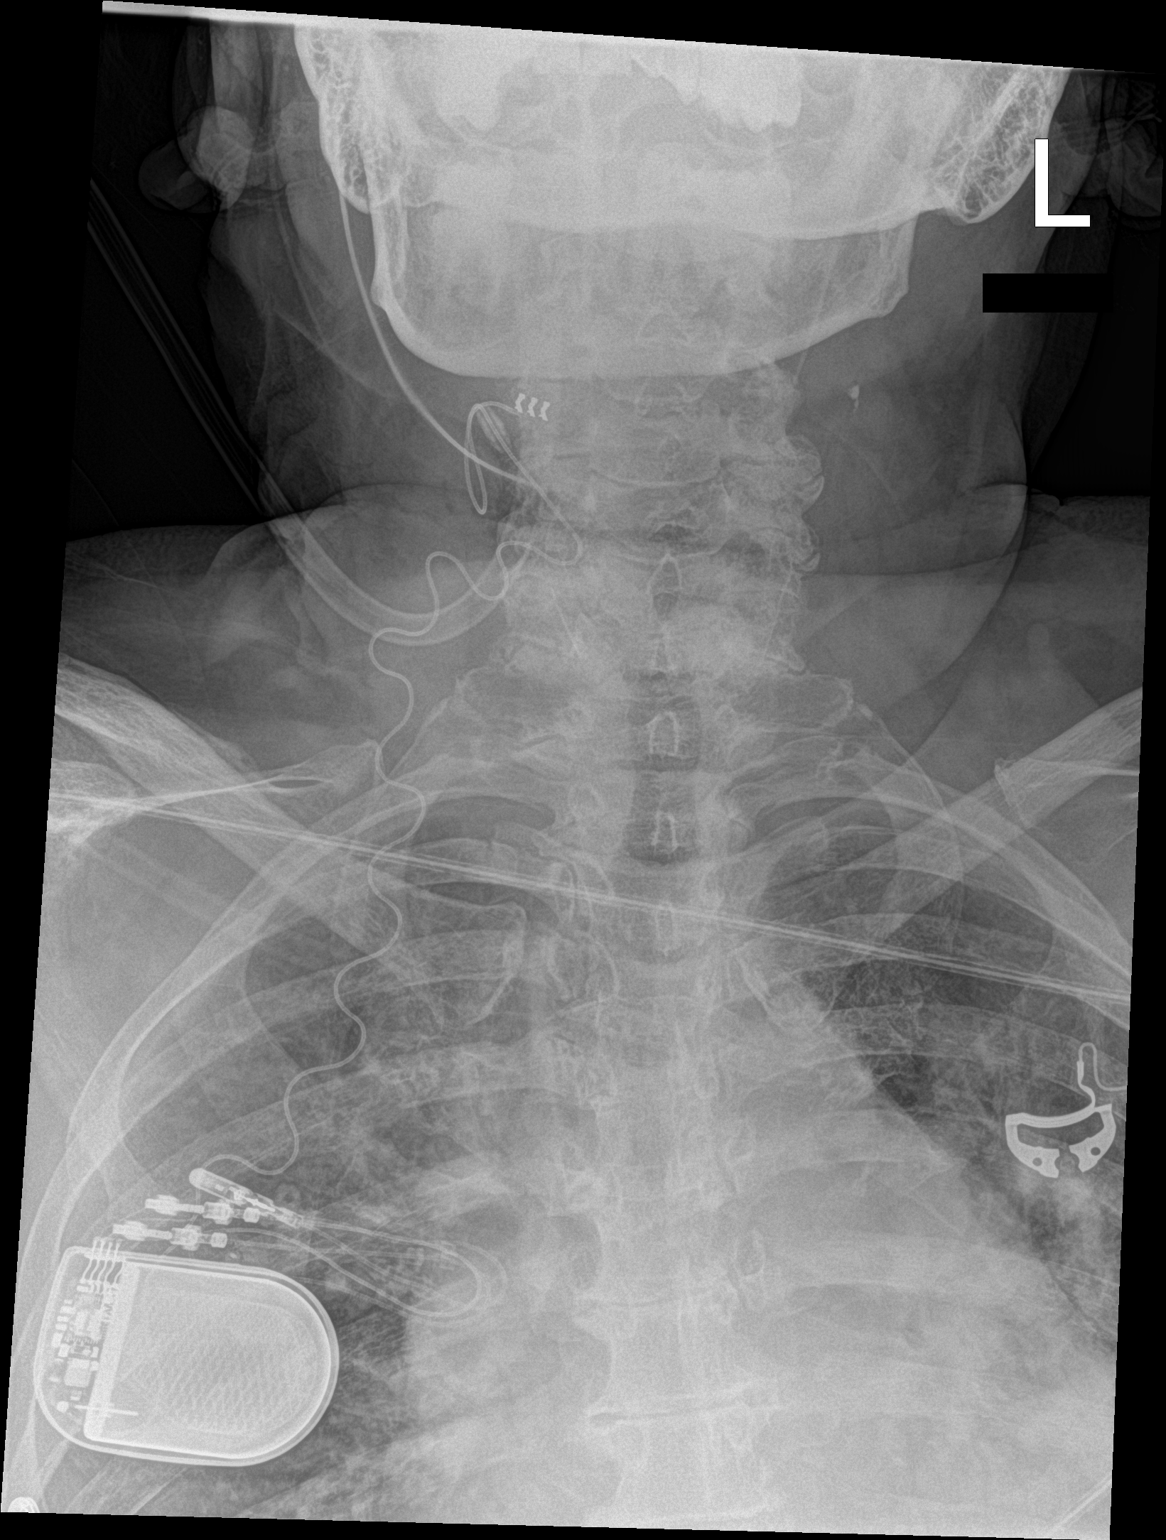

[neck ap]
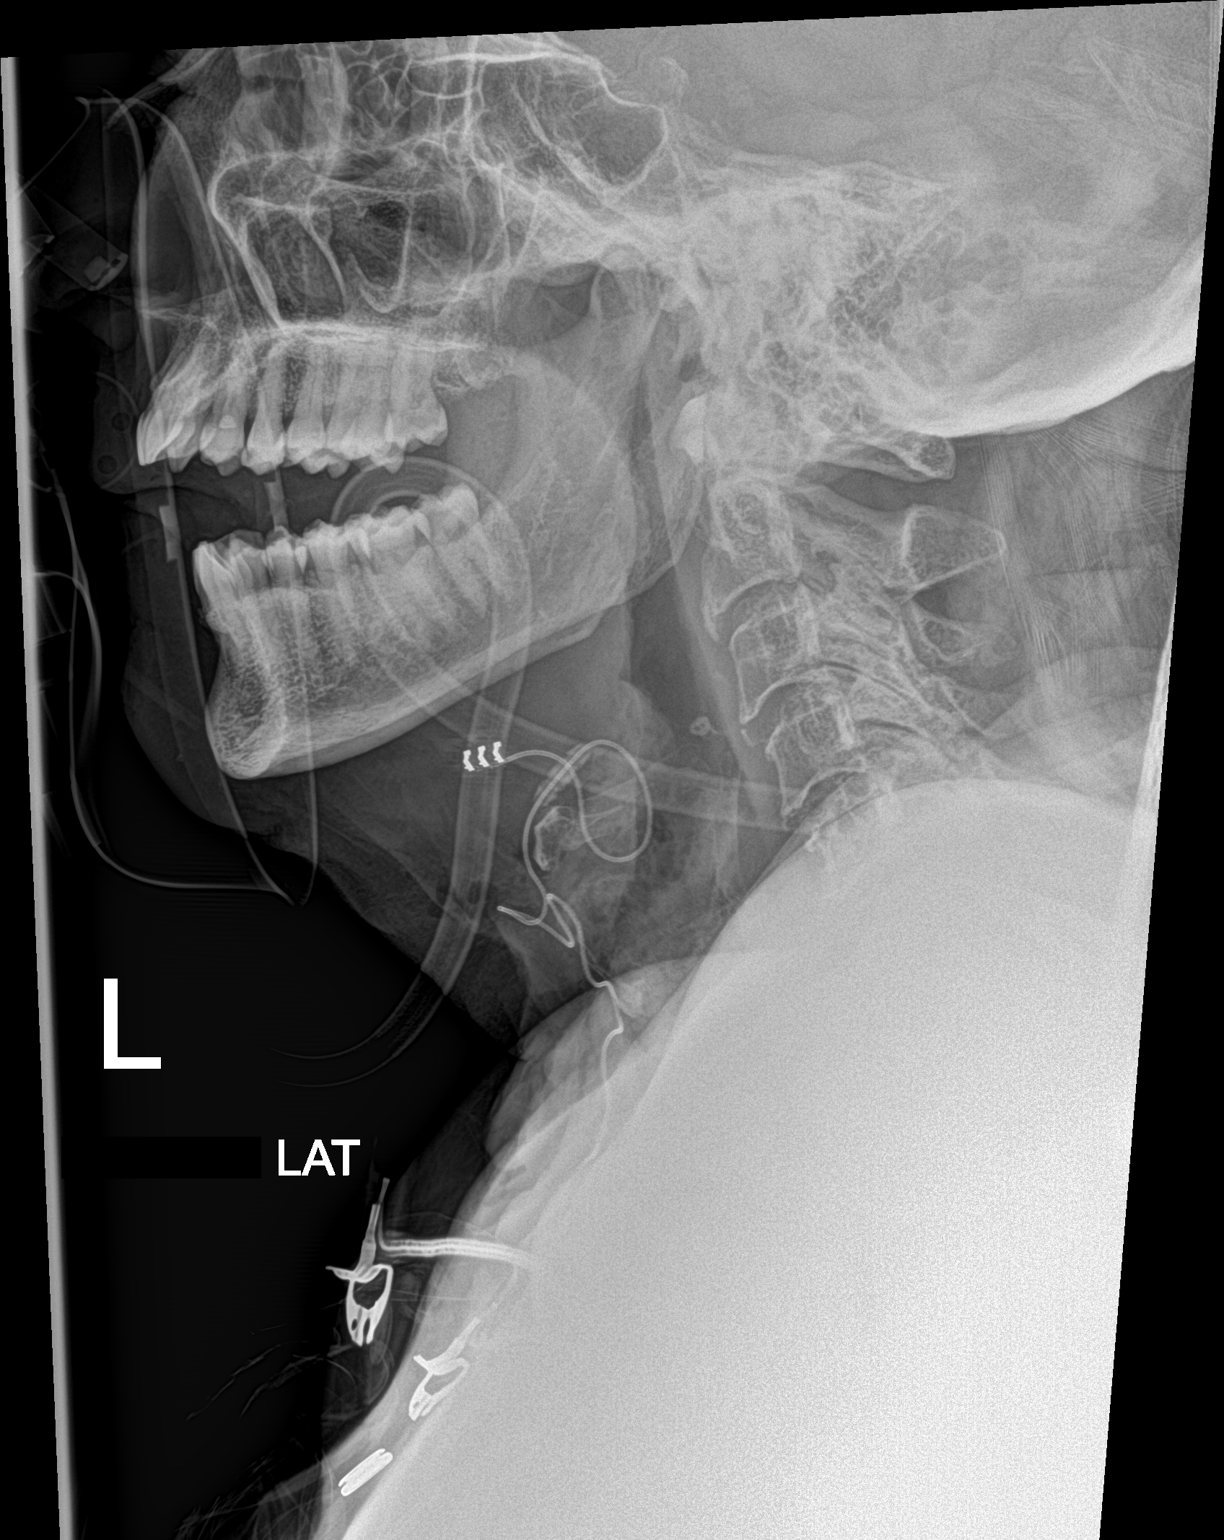

[2 of 2 positions shown; findings below may reference images not displayed]

FINDINGS: There is no evidence of retropharyngeal soft tissue swelling or
epiglottic enlargement. The cervical airway is unremarkable and no
radio-opaque foreign body identified. Degenerative changes are seen
within the cervical spine. Right hypoglossal neural stimulator
battery pack overlies the right hemithorax anteriorly with its
terminal lead within the right hypoglossal region. Surrounding soft
tissue gas is likely postsurgical in nature
IMPRESSION: Right hypoglossal nerve stimulator placement as described above.

## 2023-12-27 ENCOUNTER — Ambulatory Visit (HOSPITAL_BASED_OUTPATIENT_CLINIC_OR_DEPARTMENT_OTHER): Admitting: Pulmonary Disease

## 2023-12-27 ENCOUNTER — Encounter (HOSPITAL_BASED_OUTPATIENT_CLINIC_OR_DEPARTMENT_OTHER): Payer: Self-pay | Admitting: Pulmonary Disease

## 2023-12-27 VITALS — BP 168/79 | HR 55 | Ht 62.0 in | Wt 158.0 lb

## 2023-12-27 DIAGNOSIS — Z9682 Presence of neurostimulator: Secondary | ICD-10-CM

## 2023-12-27 DIAGNOSIS — G4733 Obstructive sleep apnea (adult) (pediatric): Secondary | ICD-10-CM | POA: Diagnosis not present

## 2023-12-27 NOTE — Patient Instructions (Signed)
 X Watchpat home study at your current level 0.7V /electrode B

## 2023-12-27 NOTE — Progress Notes (Signed)
 Subjective:    Patient ID: Dagoberto ONEIDA Schooling, female    DOB: [birth year], 78 y.o.   MRN: [id number]   78 yo  woman with hypoglossal nerve stimulator device for OSA for FU of  nocturnal hypoxia. She was intolerant of CPAP and underwent implantation of HNS on 10/14/2021. She underwent activation 11/20/2021 and was titrated to 1.7 V. Delay 60 minutes, pause 30 minutes, duration 8 hours     05/24/23 Incoming 1.7 V >> increased to 1.8 V   She did not tolerate 1.8 V due to soreness in her mouth and TMJ pain.  She decreased to 1.7 V   07/22/23  We felt that she was hyperstimulated on 1.7 V  Outgoing amplitude was 1.3 V , dropped range from 1.2-1.6 V.    10/2023  Incoming >>1.5 V, she experiences no discomfort and is unaware of its activation. Initial titration to 1.7 volts caused hyperstimulation, and 1.8 volts led to oral soreness. Voltages of 1.3, 1.4, and 1.5 volts were ineffective, with a residual AHI of 22 events per hour.  Advanced electrode configurations in electrode B and C were checked. On B, she tolerated up to 0.8 volts and was functional at 0.5 volts. Electrode C was somewhat comparable, produced a sensation towards the front of her tongue but electrode B configuration was chosen for initial trial.     Significant tests/ events reviewed     HST 10/2023 She had residual AHI of 22/h on inspire settting of 1.5V    HST SNAP 08/04/23 AHI 27/h -She still has significant residual AHI of 27/hour when she was studied although she had a 2 night study only 1 night data was available because the first night on 1.4 V she had significant insomnia, on the second night 1.3 V she had residual 27/hour      NPSG 04/2021 [baseline] AHI 19/hour worse during REM sleep, supine sleep only 55 minutes   02/2022 inspire titration -no sleep achieved on 1.7 V, AHI 21/hour on 1.6 V with 53 min desat, AHI 58/hour on 1.5 V with 45 min desat   10/2022 WatchPAT HST AHI 19/hour, 23 minutes saturation less than 89%.    12/2022 WatchPAT HST pAHI 14.6/hour, on left side 9.2/hour, 86 minutes saturation less than 89%   Discussed the use of AI scribe software for clinical note transcription with the patient, who gave verbal consent to proceed.  History of Present Illness AMREEN RACZKOWSKI is a 78 year old female with severe stable bronchiectasis who presents with lightheadedness and sleep disturbances. She is accompanied by [place], a known acquaintance.  She experiences daytime lightheadedness, with blood pressure recorded at 160/79. Her medication regimen includes amlodipine , minoxidil , and Lanoxin. Amlodipine  was reduced due to ankle swelling.  Her sleep has improved with fewer episodes of difficulty falling asleep and disturbances. She uses a device with adjusted electrode settings, which she now tolerates better, improving her sleep quality. She prefers home sleep studies due to difficulty sleeping in lab settings.        Review of Systems  neg for any significant sore throat, dysphagia, itching, sneezing, nasal congestion or excess/ purulent secretions, fever, chills, sweats, unintended wt loss, pleuritic or exertional cp, hempoptysis, orthopnea pnd or change in chronic leg swelling. Also denies presyncope, palpitations, heartburn, abdominal pain, nausea, vomiting, diarrhea or change in bowel or urinary habits, dysuria,hematuria, rash, arthralgias, visual complaints, headache, numbness weakness or ataxia.      Objective:   Physical Exam  Gen. Pleasant, obese, in  no distress ENT - no lesions, no post nasal drip Neck: No JVD, no thyromegaly, no carotid bruits Lungs: no use of accessory muscles, no dullness to percussion, decreased without rales or rhonchi  Cardiovascular: Rhythm regular, heart sounds  normal, no murmurs or gallops, no peripheral edema Musculoskeletal: No deformities, no cyanosis or clubbing , no tremors   Programming : checked tongue protrusion on diff levels B config      Assessment &  Plan:   Assessment and Plan Assessment & Plan Severe bronchiectasis Improvement in right upper lobe infection with new areas in the left lung.  Sleep apnea Improved tolerance to current settings of sleep apnea device with better sleep quality and reduced waking episodes. Acceptable AHI if less than 15 events per hour. - Continue electrode B at 0.7 volts, range 0.5-0.8  - Plan home sleep study using WATCHPAT in the next few weeks. - Confirm current level is effective if results are straightforward.  Hypertension Blood pressure reading of 160/79 mmHg. On amlodipine  5 mg, reduced from 10 mg due to ankle swelling. Potential increase to 10 mg if systolic pressure remains high.  Lightheadedness Intermittent lightheadedness not attributed to hypotension. Possible side effect of long-term Lanoxin use. Blood pressure management discussed as a potential factor.  Ankle swelling Likely a side effect of amlodipine . Current dose is 5 mg, reduced from 10 mg to manage swelling.

## 2024-01-19 ENCOUNTER — Ambulatory Visit (HOSPITAL_BASED_OUTPATIENT_CLINIC_OR_DEPARTMENT_OTHER)

## 2024-01-19 DIAGNOSIS — Z9682 Presence of neurostimulator: Secondary | ICD-10-CM

## 2024-01-19 DIAGNOSIS — G4733 Obstructive sleep apnea (adult) (pediatric): Secondary | ICD-10-CM

## 2024-01-27 ENCOUNTER — Ambulatory Visit (HOSPITAL_BASED_OUTPATIENT_CLINIC_OR_DEPARTMENT_OTHER): Admitting: Pulmonary Disease

## 2024-01-29 ENCOUNTER — Telehealth: Payer: Self-pay | Admitting: Pulmonary Disease

## 2024-01-29 NOTE — Telephone Encounter (Signed)
 WatchPAT study read  AHI of 21, moderate oxygen desaturations -Last notable amplitude on electrode B, 0.7 V from records  Has follow-up appointment upcoming

## 2024-02-02 DIAGNOSIS — H2513 Age-related nuclear cataract, bilateral: Secondary | ICD-10-CM | POA: Diagnosis not present

## 2024-02-02 DIAGNOSIS — H25013 Cortical age-related cataract, bilateral: Secondary | ICD-10-CM | POA: Diagnosis not present

## 2024-02-02 DIAGNOSIS — H524 Presbyopia: Secondary | ICD-10-CM | POA: Diagnosis not present

## 2024-02-02 DIAGNOSIS — H40013 Open angle with borderline findings, low risk, bilateral: Secondary | ICD-10-CM | POA: Diagnosis not present

## 2024-03-16 ENCOUNTER — Encounter (HOSPITAL_BASED_OUTPATIENT_CLINIC_OR_DEPARTMENT_OTHER): Payer: Self-pay | Admitting: Pulmonary Disease

## 2024-03-16 ENCOUNTER — Ambulatory Visit (HOSPITAL_BASED_OUTPATIENT_CLINIC_OR_DEPARTMENT_OTHER): Admitting: Pulmonary Disease

## 2024-03-16 VITALS — BP 163/84 | HR 60 | Ht 62.0 in | Wt 157.5 lb

## 2024-03-16 DIAGNOSIS — G4733 Obstructive sleep apnea (adult) (pediatric): Secondary | ICD-10-CM | POA: Diagnosis not present

## 2024-03-16 DIAGNOSIS — Z9682 Presence of neurostimulator: Secondary | ICD-10-CM

## 2024-03-16 DIAGNOSIS — Z23 Encounter for immunization: Secondary | ICD-10-CM

## 2024-03-16 DIAGNOSIS — Z4542 Encounter for adjustment and management of neuropacemaker (brain) (peripheral nerve) (spinal cord): Secondary | ICD-10-CM

## 2024-03-16 NOTE — Progress Notes (Addendum)
 Subjective:    Patient ID: Dagoberto ONEIDA Schooling, female    DOB: February 13, 1946, 78 y.o.   MRN: [id number]   78 yo  woman for FU of hypoglossal nerve stimulator device for OSA + nocturnal hypoxia  She was intolerant of CPAP and underwent implantation of HNS on 10/14/2021. She underwent activation 11/20/2021 and was titrated to 1.7 V. Delay 60 minutes, pause 30 minutes, duration 8 hours     05/24/23 Incoming 1.7 V >> increased to 1.8 V   She did not tolerate 1.8 V due to soreness in her mouth and TMJ pain.  She decreased to 1.7 V   07/22/23  We felt that she was hyperstimulated on 1.7 V  Outgoing amplitude was 1.3 V , dropped range from 1.2-1.6 V.    10/2023  Incoming >>1.5 V, she experiences no discomfort and is unaware of its activation. Initial titration to 1.7 volts caused hyperstimulation, and 1.8 volts led to oral soreness. Voltages of 1.3, 1.4, and 1.5 volts were ineffective, with a residual AHI of 22 events per hour.   Advanced electrode configurations in electrode B and C were checked. On B, she tolerated up to 0.8 volts and was functional at 0.5 volts. Electrode C was somewhat comparable, produced a sensation towards the front of her tongue but electrode B configuration was chosen for initial trial.    12/2023 OV >> Incoming elec B 0.7 volts, range 0.5-0.8    Significant tests/ events reviewed    WatchPAT 01/2024 on electrode B, 0.7 , AHI of 21, moderate oxygen desaturations  HST 10/2023 She had residual AHI of 22/h on inspire settting of 1.5V    HST SNAP 08/04/23 AHI 27/h -She still has significant residual AHI of 27/hour when she was studied although she had a 2 night study only 1 night data was available because the first night on 1.4 V she had significant insomnia, on the second night 1.3 V she had residual 27/hour      NPSG 04/2021 [baseline] AHI 19/hour worse during REM sleep, supine sleep only 55 minutes   02/2022 inspire titration -no sleep achieved on 1.7 V, AHI 21/hour on 1.6 V with  53 min desat, AHI 58/hour on 1.5 V with 45 min desat   10/2022 WatchPAT HST AHI 19/hour, 23 minutes saturation less than 89%.   12/2022 WatchPAT HST pAHI 14.6/hour, on left side 9.2/hour, 86 minutes saturation less than 89% Discussed the use of AI scribe software for clinical note transcription with the patient, who gave verbal consent to proceed.  History of Present Illness HADESSAH GRENNAN is a 78 year old female with bronchiectasis who presents for follow-up.  She experiences increased respiratory symptoms, particularly affecting her breathing. She uses nasal sprays more frequently and has watery eyes and sneezing. There is no significant coughing, and she is not using any inhalers.  She uses a device for her condition, adjusting settings between levels three and four. She uses it throughout the night with one or two pauses, which have decreased over the past month. Her time to fall asleep has decreased from one hour to less than an hour. Current device settings include a start delay of sixty minutes and a pause time of thirty minutes. The device does not wake her at night, even at higher settings.  A home sleep test showed twenty events per hour, an improvement from previous results. She prefers home sleep tests due to difficulty sleeping in a lab setting.  Incoming elec B 0.7 V (  range 0.5-0.8) Start delay 60 mins     Review of Systems  neg for any significant sore throat, dysphagia, itching, sneezing, nasal congestion or excess/ purulent secretions, fever, chills, sweats, unintended wt loss, pleuritic or exertional cp, hempoptysis, orthopnea pnd or change in chronic leg swelling. Also denies presyncope, palpitations, heartburn, abdominal pain, nausea, vomiting, diarrhea or change in bowel or urinary habits, dysuria,hematuria, rash, arthralgias, visual complaints, headache, numbness weakness or ataxia.      Objective:   Physical Exam  Gen. Pleasant, obese, in no distress ENT - no lesions,  no post nasal drip Neck: No JVD, no thyromegaly, no carotid bruits Lungs: no use of accessory muscles, no dullness to percussion, decreased without rales or rhonchi  Cardiovascular: Rhythm regular, heart sounds  normal, no murmurs or gallops, no peripheral edema Musculoskeletal: No deformities, no cyanosis or clubbing , no tremors       Assessment & Plan:   Assessment and Plan Assessment & Plan Obstructive sleep apnea Obstructive sleep apnea with approximately 20 events per hour, improved from previous levels but still above the target of less than 15 events per hour. Good compliance with device usage, averaging 7 hours per night with 87% usage. Prefers home sleep tests over lab tests due to difficulty sleeping in lab settings. - Increase device setting to 0.8 volts. Range 0.5-0.8 V - Decrease start delay to 45 minutes. - Conduct a home sleep test at the new setting in one month. - Consider switching to electrode C if events remain high after the home test.  Dyspnea No significant symptoms reported. No current use of inhalers and no significant coughing. - Administer flu shot.

## 2024-03-16 NOTE — Patient Instructions (Addendum)
 X flu shot  We increased your voltage to 0.8 V, level 4  Home sleep study at this level in 1 m onth  We lowered your start delay to 45 mins

## 2024-03-16 NOTE — Addendum Note (Signed)
 Addended by: TRUDY WARREN CROME on: 03/16/2024 10:42 AM   Modules accepted: Orders

## 2024-04-07 NOTE — Progress Notes (Addendum)
 Jessica Gibson                                          MRN: [id number]   06/19/2024   The [facility] Quality Team Specialist reviewed this patient medical record for the purposes of chart review for care gap closure. The following were reviewed: chart review for care gap closure-controlling blood pressure.    [facility] Quality Team

## 2024-04-10 DIAGNOSIS — D2271 Melanocytic nevi of right lower limb, including hip: Secondary | ICD-10-CM | POA: Diagnosis not present

## 2024-04-10 DIAGNOSIS — Z85828 Personal history of other malignant neoplasm of skin: Secondary | ICD-10-CM | POA: Diagnosis not present

## 2024-04-10 DIAGNOSIS — L821 Other seborrheic keratosis: Secondary | ICD-10-CM | POA: Diagnosis not present

## 2024-04-10 DIAGNOSIS — L820 Inflamed seborrheic keratosis: Secondary | ICD-10-CM | POA: Diagnosis not present

## 2024-04-10 DIAGNOSIS — L814 Other melanin hyperpigmentation: Secondary | ICD-10-CM | POA: Diagnosis not present

## 2024-04-10 DIAGNOSIS — D1801 Hemangioma of skin and subcutaneous tissue: Secondary | ICD-10-CM | POA: Diagnosis not present

## 2024-04-10 DIAGNOSIS — D225 Melanocytic nevi of trunk: Secondary | ICD-10-CM | POA: Diagnosis not present

## 2024-04-10 DIAGNOSIS — D224 Melanocytic nevi of scalp and neck: Secondary | ICD-10-CM | POA: Diagnosis not present

## 2024-04-10 DIAGNOSIS — L853 Xerosis cutis: Secondary | ICD-10-CM | POA: Diagnosis not present

## 2024-04-27 DIAGNOSIS — Z1231 Encounter for screening mammogram for malignant neoplasm of breast: Secondary | ICD-10-CM | POA: Diagnosis not present

## 2024-06-09 ENCOUNTER — Ambulatory Visit (HOSPITAL_BASED_OUTPATIENT_CLINIC_OR_DEPARTMENT_OTHER)

## 2024-06-09 DIAGNOSIS — Z9682 Presence of neurostimulator: Secondary | ICD-10-CM

## 2024-06-09 DIAGNOSIS — G4733 Obstructive sleep apnea (adult) (pediatric): Secondary | ICD-10-CM

## 2024-07-01 ENCOUNTER — Telehealth: Payer: Self-pay | Admitting: Pulmonary Disease

## 2024-07-01 NOTE — Telephone Encounter (Signed)
 Call patient  Sleep study result  Date of study: 06/12/2024.  WatchPAT study  Impression: Severe obstructive sleep apnea with moderate oxygen desaturations AHI of 31.3 with O2 nadir of 82% saturations below 88% for 10 minutes Moderate hypoxic burden Good sleep efficiency  Latest amplitude of 0.8, electrode C per last office note Suboptimal control of sleep disordered breathing  Recommendation: Encourage continuing optimization of hypoglossal nerve stimulator  Avoid alcohol , sedatives and other CNS depressants that may worsen sleep apnea and disrupt normal sleep architecture. Sleep hygiene should be reviewed to assess factors that may improve sleep quality. Weight management and regular exercise should be initiated or continued  Early follow-up appointment for optimization

## 2024-07-04 NOTE — Telephone Encounter (Signed)
 Patient was previously scheduled on 09/28/2024, does a earlier appointment need to be made?

## 2024-07-04 NOTE — Telephone Encounter (Signed)
 Appointment has been scheduled.

## 2024-07-07 NOTE — Telephone Encounter (Signed)
 Patient scheduled 08/10/2024 with Jessica Gibson, nothing further needed

## 2024-07-07 NOTE — Telephone Encounter (Signed)
 Left voicemail for patient to call back to schedule [facility] follow up.

## 2024-08-10 ENCOUNTER — Ambulatory Visit (HOSPITAL_BASED_OUTPATIENT_CLINIC_OR_DEPARTMENT_OTHER): Admitting: Pulmonary Disease

## 2024-09-28 ENCOUNTER — Ambulatory Visit (HOSPITAL_BASED_OUTPATIENT_CLINIC_OR_DEPARTMENT_OTHER): Admitting: Pulmonary Disease
